# Patient Record
Sex: Female | Born: 1990 | Race: White | Hispanic: No | Marital: Single
Health system: Midwestern US, Community
[De-identification: ages and names within clinical notes are randomized; demographics above are authoritative.]

## PROBLEM LIST (undated history)

## (undated) ENCOUNTER — Inpatient Hospital Stay: Payer: Self-pay

## (undated) ENCOUNTER — Inpatient Hospital Stay (HOSPITAL_COMMUNITY): Payer: Self-pay

## (undated) ENCOUNTER — Emergency Department (HOSPITAL_COMMUNITY): Admission: EM | Payer: Medicaid Other | Source: Home / Self Care

## (undated) DIAGNOSIS — F329 Major depressive disorder, single episode, unspecified: Secondary | ICD-10-CM

## (undated) DIAGNOSIS — K759 Inflammatory liver disease, unspecified: Secondary | ICD-10-CM

## (undated) DIAGNOSIS — R51 Headache: Secondary | ICD-10-CM

## (undated) DIAGNOSIS — R519 Headache, unspecified: Secondary | ICD-10-CM

## (undated) DIAGNOSIS — I10 Essential (primary) hypertension: Secondary | ICD-10-CM

## (undated) DIAGNOSIS — F319 Bipolar disorder, unspecified: Secondary | ICD-10-CM

## (undated) DIAGNOSIS — F4310 Post-traumatic stress disorder, unspecified: Secondary | ICD-10-CM

## (undated) DIAGNOSIS — F988 Other specified behavioral and emotional disorders with onset usually occurring in childhood and adolescence: Secondary | ICD-10-CM

## (undated) DIAGNOSIS — Z9851 Tubal ligation status: Secondary | ICD-10-CM

## (undated) DIAGNOSIS — F32A Depression, unspecified: Secondary | ICD-10-CM

## (undated) DIAGNOSIS — F419 Anxiety disorder, unspecified: Secondary | ICD-10-CM

## (undated) HISTORY — DX: Post-traumatic stress disorder, unspecified: F43.10

## (undated) HISTORY — DX: Headache: R51

## (undated) HISTORY — DX: Headache, unspecified: R51.9

## (undated) HISTORY — DX: Depression, unspecified: F32.A

## (undated) HISTORY — PX: MOUTH SURGERY: SHX715

## (undated) HISTORY — DX: Bipolar disorder, unspecified: F31.9

## (undated) HISTORY — DX: Major depressive disorder, single episode, unspecified: F32.9

---
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
## 2009-01-05 ENCOUNTER — Emergency Department (HOSPITAL_COMMUNITY): Admission: EM | Admit: 2009-01-05 | Discharge: 2009-01-05 | Payer: Self-pay | Admitting: Family Medicine

## 2009-04-08 ENCOUNTER — Emergency Department (HOSPITAL_COMMUNITY): Admission: EM | Admit: 2009-04-08 | Discharge: 2009-04-08 | Payer: Self-pay | Admitting: Family Medicine

## 2009-06-24 ENCOUNTER — Emergency Department (HOSPITAL_COMMUNITY): Admission: EM | Admit: 2009-06-24 | Discharge: 2009-06-24 | Payer: Self-pay | Admitting: Family Medicine

## 2009-12-12 ENCOUNTER — Emergency Department (HOSPITAL_COMMUNITY): Admission: EM | Admit: 2009-12-12 | Discharge: 2009-12-12 | Payer: Self-pay | Admitting: Emergency Medicine

## 2009-12-31 ENCOUNTER — Emergency Department (HOSPITAL_COMMUNITY): Admission: EM | Admit: 2009-12-31 | Discharge: 2009-12-31 | Payer: Self-pay | Admitting: Emergency Medicine

## 2010-03-08 ENCOUNTER — Emergency Department (HOSPITAL_COMMUNITY)
Admission: EM | Admit: 2010-03-08 | Discharge: 2010-03-08 | Payer: Self-pay | Source: Home / Self Care | Admitting: Emergency Medicine

## 2010-03-09 LAB — POCT PREGNANCY, URINE: Preg Test, Ur: NEGATIVE

## 2010-04-27 LAB — COMPREHENSIVE METABOLIC PANEL
ALT: 23 U/L (ref 0–35)
AST: 25 U/L (ref 0–37)
Alkaline Phosphatase: 65 U/L (ref 39–117)
BUN: 14 mg/dL (ref 6–23)
CO2: 26 mEq/L (ref 19–32)
Calcium: 9.4 mg/dL (ref 8.4–10.5)
Chloride: 107 mEq/L (ref 96–112)
Creatinine, Ser: 0.64 mg/dL (ref 0.4–1.2)
GFR calc Af Amer: >60 mL/min (ref >60)
Glucose, Bld: 90 mg/dL (ref 70–99)
Potassium: 4.1 mEq/L (ref 3.5–5.1)
Total Bilirubin: 0.9 mg/dL (ref 0.3–1.2)
Total Protein: 7.2 g/dL (ref 6.0–8.3)

## 2010-04-27 LAB — DIFFERENTIAL
Eosinophils Absolute: 0.1 10*3/uL (ref 0.0–0.7)
Eosinophils Relative: 1 % (ref 0–5)
Lymphs Abs: 2.1 10*3/uL (ref 0.7–4.0)
Monocytes Absolute: 0.7 10*3/uL (ref 0.1–1.0)
Monocytes Relative: 6 % (ref 3–12)
Neutro Abs: 8.4 10*3/uL — ABNORMAL HIGH (ref 1.7–7.7)

## 2010-04-27 LAB — URINALYSIS, ROUTINE W REFLEX MICROSCOPIC
Bilirubin Urine: NEGATIVE
Ketones, ur: NEGATIVE mg/dL
Specific Gravity, Urine: 1.033 — ABNORMAL HIGH (ref 1.005–1.030)

## 2010-04-27 LAB — LIPASE, BLOOD: Lipase: 31 U/L (ref 11–59)

## 2010-04-27 LAB — CBC
Hemoglobin: 16.5 g/dL — ABNORMAL HIGH (ref 12.0–15.0)
MCV: 93.9 fL (ref 78.0–100.0)

## 2010-05-03 LAB — POCT I-STAT, CHEM 8
Calcium, Ion: 1.18 mmol/L (ref 1.12–1.32)
Hemoglobin: 16.3 g/dL — ABNORMAL HIGH (ref 12.0–15.0)
Potassium: 4.4 mEq/L (ref 3.5–5.1)
TCO2: 29 mmol/L (ref 0–100)

## 2010-05-18 LAB — POCT URINALYSIS DIP (DEVICE)
Ketones, ur: NEGATIVE mg/dL
Protein, ur: 100 mg/dL — AB
pH: 6 (ref 5.0–8.0)

## 2010-05-18 LAB — POCT PREGNANCY, URINE: Preg Test, Ur: NEGATIVE

## 2010-05-18 LAB — URINE CULTURE: Colony Count: >=100000

## 2010-05-26 LAB — RPR: RPR Ser Ql: NONREACTIVE

## 2010-05-26 LAB — CBC
HCT: 37.1 % (ref 36.0–46.0)
HCT: 38.9 % (ref 36.0–46.0)
MCHC: 34.6 g/dL (ref 30.0–36.0)
MCV: 96 fL (ref 78.0–100.0)
Platelets: 199 10*3/uL (ref 150–400)
RBC: 3.86 MIL/uL — ABNORMAL LOW (ref 3.87–5.11)
RBC: 4.14 MIL/uL (ref 3.87–5.11)
RDW: 13.3 % (ref 11.5–15.5)
WBC: 16 10*3/uL — ABNORMAL HIGH (ref 4.0–10.5)

## 2010-05-26 LAB — CCBB MATERNAL DONOR DRAW

## 2010-06-21 ENCOUNTER — Emergency Department (HOSPITAL_COMMUNITY)
Admission: EM | Admit: 2010-06-21 | Discharge: 2010-06-22 | Disposition: A | Discharge status: Home / Self Care | Payer: Self-pay | Attending: Emergency Medicine | Admitting: Emergency Medicine

## 2010-06-21 DIAGNOSIS — R1013 Epigastric pain: Secondary | ICD-10-CM | POA: Insufficient documentation

## 2010-06-21 DIAGNOSIS — R197 Diarrhea, unspecified: Secondary | ICD-10-CM | POA: Insufficient documentation

## 2010-06-21 DIAGNOSIS — R110 Nausea: Secondary | ICD-10-CM | POA: Insufficient documentation

## 2010-06-21 DIAGNOSIS — E860 Dehydration: Secondary | ICD-10-CM | POA: Insufficient documentation

## 2010-06-21 LAB — DIFFERENTIAL
Basophils Absolute: 0.1 10*3/uL (ref 0.0–0.1)
Eosinophils Absolute: 0.1 10*3/uL (ref 0.0–0.7)
Eosinophils Relative: 1 % (ref 0–5)
Lymphocytes Relative: 25 % (ref 12–46)
Neutrophils Relative %: 64 % (ref 43–77)

## 2010-06-21 LAB — COMPREHENSIVE METABOLIC PANEL
ALT: 18 U/L (ref 0–35)
AST: 21 U/L (ref 0–37)
Albumin: 4.2 g/dL (ref 3.5–5.2)
Alkaline Phosphatase: 89 U/L (ref 39–117)
Chloride: 102 mEq/L (ref 96–112)
GFR calc Af Amer: >60 mL/min (ref >60)
Potassium: 3.6 mEq/L (ref 3.5–5.1)
Sodium: 135 mEq/L (ref 135–145)
Total Protein: 7.7 g/dL (ref 6.0–8.3)

## 2010-06-21 LAB — URINALYSIS, ROUTINE W REFLEX MICROSCOPIC
Hgb urine dipstick: NEGATIVE
Specific Gravity, Urine: 1.034 — ABNORMAL HIGH (ref 1.005–1.030)
pH: 6 (ref 5.0–8.0)

## 2010-06-21 LAB — CBC
Platelets: 256 10*3/uL (ref 150–400)
RBC: 5.23 MIL/uL — ABNORMAL HIGH (ref 3.87–5.11)
RDW: 12.9 % (ref 11.5–15.5)
WBC: 9.8 10*3/uL (ref 4.0–10.5)

## 2010-06-21 LAB — POCT PREGNANCY, URINE: Preg Test, Ur: NEGATIVE

## 2010-06-21 LAB — URINE MICROSCOPIC-ADD ON

## 2010-06-28 NOTE — H&P (Signed)
[name], [name]              ACCOUNT NO.:  [id number]   MEDICAL RECORD NO.:  [id number]          PATIENT TYPE:  INP   LOCATION:  9173                          FACILITY:  [facility]   PHYSICIAN:  Janine Limbo, M.D.DATE OF BIRTH:  May 27, 1990   DATE OF ADMISSION:  05/11/2008  DATE OF DISCHARGE:                              HISTORY & PHYSICAL   CHIEF COMPLAINT AND HISTORY OF THE PRESENT ILLNESS:  The patient is an  20 year old gravida 2, para 1-0-0-1 at 40-1/7ths-week gestation who was  followed by the physicians at [facility], and who presents  today for induction of labor secondary to enlarged fetal bladder and  bilateral pyelectasis.  The patient's pregnancy has been remarkable for:  1. Teen second baby.  2. Smoker.  3. Hypotension with episodes of blackouts.  4. Ketonuria.  5. Enlarged fetal bladder with bilateral pyelectasis.   PRENATAL LABORATORY DATA:  The patient's prenatal labs upon entering  prenatal care included an initial hemoglobin of 14.4, hematocrit 41.3  and platelets 253,000.  Blood type A+, RPR nonreactive.  Rubella titer  immune.  Hepatitis B negative.  HIV negative; first semester screen with  a normal  range.  Pap normal.  Gonorrhea and chlamydia negative.  Beta  Strep negative.  Hepatitis C negative.  She did have a negative fetal  fibronectin in the second trimester.   CURRENT MEDICATIONS:  Prenatal vitamins daily.   HISTORY OF THE PRESENT PREGNANCY:  The patient began her prenatal care  on October 24, 2007 at the [redacted] weeks gestation.  She was given some  Phenergan for nausea and counseled on her nutrition.  At that time she  only weighed 114 pounds at 5 feet 4.5 inches and smoking cessation was  discussed as well.  At 12 weeks she reported some depression and was  encouraged to go and get some counseling, and was referred to a  maternity care coordinator with [facility] for all kinds of support  resources.  Her first trimester  screening was negative in September at  17 weeks.  She reported episodes of dizziness and blacking out.  Her AFP  was drawn at that time and hepatitis C was run due to a homemade tattoo  on her left hand.  The AFP was normal.  The hepatitis C was negative.  She was advised to improve her nutrition including her frequency of  meals and to change positions slowly.  Her anatomy ultrasound at 23  weeks showed size consistent with dates and a cervix of 4.5 cm, fluid  4.0 cm, an estimated fetal weight of 11 ounces, placenta was anterior  with a three-vessel cord, and all anatomy was normal.  Glucola was done  at 27 weeks with a result of 69.  At that time her RPR was nonreactive  and her hemoglobin was 13.2.  Her blood type is A+; and, antibody screen  was run and found to be negative.  At 29 weeks she had an upper  respiratory infection and was ordered some Mucinex, and counseled again  on smoking cessation.  At 31 weeks she was complaining  of pelvic  pressure and a fetal fibronectin was done, which was negative.  At 36  weeks she had tests for gonorrhea, Chlamydia and GBS, which turned out  to be negative.  At 40 weeks she was noted to have increased bladder  size of the fetus on an ultrasound and bilateral pyelectasis, and was  encouraged to consider induction, which she accepted and she presents  today for that.   PAST OBSTETRICAL HISTORY:  The patient has had one other pregnancy,  which resulted in the birth of a daughter in July 2008 at [redacted] weeks  gestation delivered vaginally without complications.   ALLERGIES:  The patient has no known medication allergies, latex  allergies or food allergies.   PAST MEDICAL HISTORY:  The patient has a history of postpartum  depression and yeast infections.   FAMILY HISTORY:  The patient's family history includes having a mother  who entered a persistent vegetative state following two heart attacks;  she is now 21 years old.  Her paternal grandmother  is an insulin-  dependent diabetic.  She does not have contact with her father and does  not have a lot of information on him or his side of the family.   GENETIC HISTORY:  The genetic history is noncontributory.  There is no  cystic fibrosis screen listed.  The father of the baby does report  having a great aunt with mental retardation.  Otherwise his genetic  history is noncontributory.   SOCIAL HISTORY:  The patient is in a relationship with Michel Bickers.  They are both Caucasian and teenagers.  Her occupation is listed as  window cleaning.  I do not see level of education on her or the father  of the baby.  He is disabled due to a Staph infection in his left leg  and surgical intervention for that.  He also has bipolar disorder.  There is no religion stated.  The patient is a smoker.  She denies the  use of alcohol or street drugs during the pregnancy.   PHYSICAL EXAMINATION:  VITAL SIGNS:  The vital signs include a  temperature of 98.6, pulse 90, respiratory rate 16 and blood pressure  118/66.  HEENT:  The head, eyes, ears, nose, and throat are within normal limits.  LUNGS:  The lungs are clear to auscultation bilaterally.  HEART:  The heart is with a regular rate and rhythm.  No murmurs.  BREASTS:  The breasts are soft.  ABDOMEN:  The abdomen is soft and nontender.  Gravid, appropriate for  gestational age.  EXTREMITIES:  The extremities are within normal limits with trace edema  in the bilateral lower extremities.  Normal DTRs.  Negative Homans' sign  x2.  VAGINAL EXAMINATION:  Fetal heart rate 135 with accelerations, reactive  and reassuring.  Toco tracing; very rare mild uterine contractions of 60  seconds.  Bishop score of 6 per the R.N.'s exam.   IMPRESSION:  1. [age]-year-old gravida 2, para 1-0-0-1 at 40-1/7ths weeks.  2. Enlarged fetal bladder.  3. Bilateral pyelectasis.  4. Reassuring fetal heart rate okay for induction of labor.   PLAN:  1. Admit to  birthing suite.  2. Routine admission orders.  3. Cytotec and Pitocin per protocol, and M.D. management.  4. Social Services consult postpartum for teen second pregnancy and      history of depression.      Eulogio Bear, CNM      Janine Limbo, M.D.  Electronically Signed  JM/MEDQ  D:  05/12/2008  T:  05/12/2008  Job:  [id number]
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
## 2010-08-21 ENCOUNTER — Emergency Department (HOSPITAL_COMMUNITY)
Admission: EM | Admit: 2010-08-21 | Discharge: 2010-08-21 | Disposition: A | Discharge status: Home / Self Care | Payer: Self-pay | Attending: Emergency Medicine | Admitting: Emergency Medicine

## 2010-08-21 DIAGNOSIS — J4489 Other specified chronic obstructive pulmonary disease: Secondary | ICD-10-CM | POA: Insufficient documentation

## 2010-08-21 DIAGNOSIS — J449 Chronic obstructive pulmonary disease, unspecified: Secondary | ICD-10-CM | POA: Insufficient documentation

## 2010-08-21 DIAGNOSIS — K089 Disorder of teeth and supporting structures, unspecified: Secondary | ICD-10-CM | POA: Insufficient documentation

## 2010-08-21 DIAGNOSIS — E119 Type 2 diabetes mellitus without complications: Secondary | ICD-10-CM | POA: Insufficient documentation

## 2010-08-21 DIAGNOSIS — IMO0002 Reserved for concepts with insufficient information to code with codable children: Secondary | ICD-10-CM | POA: Insufficient documentation

## 2010-08-21 DIAGNOSIS — S025XXA Fracture of tooth (traumatic), initial encounter for closed fracture: Secondary | ICD-10-CM | POA: Insufficient documentation

## 2010-08-21 DIAGNOSIS — I2510 Atherosclerotic heart disease of native coronary artery without angina pectoris: Secondary | ICD-10-CM | POA: Insufficient documentation

## 2010-08-21 DIAGNOSIS — S01501A Unspecified open wound of lip, initial encounter: Secondary | ICD-10-CM | POA: Insufficient documentation

## 2010-11-29 LAB — CBC
HCT: 41.5
Hemoglobin: 11.3 — ABNORMAL LOW
MCHC: 33.8
MCHC: 33.9
MCV: 91.9
Platelets: 234
Platelets: 240
RDW: 13.1
RDW: 13.5
WBC: 17.8 — ABNORMAL HIGH

## 2011-01-08 ENCOUNTER — Emergency Department (HOSPITAL_COMMUNITY)
Admission: EM | Admit: 2011-01-08 | Discharge: 2011-01-08 | Disposition: A | Discharge status: Home / Self Care | Payer: Self-pay | Attending: Emergency Medicine | Admitting: Emergency Medicine

## 2011-01-08 ENCOUNTER — Emergency Department (HOSPITAL_COMMUNITY): Payer: Self-pay

## 2011-01-08 ENCOUNTER — Encounter: Payer: Self-pay | Admitting: Emergency Medicine

## 2011-01-08 DIAGNOSIS — W1789XA Other fall from one level to another, initial encounter: Secondary | ICD-10-CM | POA: Insufficient documentation

## 2011-01-08 DIAGNOSIS — M25539 Pain in unspecified wrist: Secondary | ICD-10-CM | POA: Insufficient documentation

## 2011-01-08 DIAGNOSIS — M79609 Pain in unspecified limb: Secondary | ICD-10-CM | POA: Insufficient documentation

## 2011-01-08 DIAGNOSIS — S6390XA Sprain of unspecified part of unspecified wrist and hand, initial encounter: Secondary | ICD-10-CM | POA: Insufficient documentation

## 2011-01-08 DIAGNOSIS — M7989 Other specified soft tissue disorders: Secondary | ICD-10-CM | POA: Insufficient documentation

## 2011-01-08 DIAGNOSIS — F17200 Nicotine dependence, unspecified, uncomplicated: Secondary | ICD-10-CM | POA: Insufficient documentation

## 2011-01-08 DIAGNOSIS — S63602A Unspecified sprain of left thumb, initial encounter: Secondary | ICD-10-CM
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
## 2011-01-08 NOTE — Progress Notes (Signed)
Orthopedic Tech Progress Note Patient Details:  [name] 03-03-1990 [id number]  Type of Splint: Thumb velcro Splint Location: left hand Splint Interventions: Application    Nikki Dom 01/08/2011, 10:08 PM
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
## 2011-01-09 NOTE — ED Provider Notes (Signed)
Medical screening examination/treatment/procedure(s) were performed by non-physician practitioner and as supervising physician I was immediately available for consultation/collaboration.   Vonceil Upshur R. Worth Kober, MD 01/09/11 0015 

## 2011-04-16 ENCOUNTER — Emergency Department (HOSPITAL_COMMUNITY): Payer: No Typology Code available for payment source

## 2011-04-16 ENCOUNTER — Emergency Department (HOSPITAL_COMMUNITY)
Admission: EM | Admit: 2011-04-16 | Discharge: 2011-04-16 | Disposition: A | Discharge status: Home / Self Care | Payer: No Typology Code available for payment source | Attending: Emergency Medicine | Admitting: Emergency Medicine

## 2011-04-16 ENCOUNTER — Encounter (HOSPITAL_COMMUNITY): Payer: Self-pay | Admitting: *Deleted

## 2011-04-16 DIAGNOSIS — M542 Cervicalgia: Secondary | ICD-10-CM | POA: Insufficient documentation

## 2011-04-16 DIAGNOSIS — T22119A Burn of first degree of unspecified forearm, initial encounter: Secondary | ICD-10-CM | POA: Insufficient documentation

## 2011-04-16 MED ORDER — DIAZEPAM 5 MG PO TABS: 5.0000 mg | ORAL_TABLET | Freq: Two times a day (BID) | ORAL | Status: AC

## 2011-04-16 MED ORDER — SILVER SULFADIAZINE 1 % EX CREA
TOPICAL_CREAM | Freq: Once | CUTANEOUS | Status: AC
  Administered 2011-04-16: 19:00:00 via TOPICAL
  Filled 2011-04-16: qty 85

## 2011-04-16 MED ORDER — IBUPROFEN 600 MG PO TABS: 600.0000 mg | ORAL_TABLET | Freq: Four times a day (QID) | ORAL | Status: DC | PRN

## 2011-04-16 MED ORDER — ACETAMINOPHEN 325 MG PO TABS
ORAL_TABLET | ORAL | Status: AC
  Filled 2011-04-16: qty 2
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
## 2011-04-17 ENCOUNTER — Encounter (HOSPITAL_COMMUNITY): Payer: Self-pay

## 2011-04-17 ENCOUNTER — Emergency Department (HOSPITAL_COMMUNITY)
Admission: EM | Admit: 2011-04-17 | Discharge: 2011-04-17 | Disposition: A | Discharge status: Home Health | Payer: No Typology Code available for payment source | Attending: Emergency Medicine | Admitting: Emergency Medicine

## 2011-04-17 DIAGNOSIS — S335XXA Sprain of ligaments of lumbar spine, initial encounter: Secondary | ICD-10-CM | POA: Insufficient documentation

## 2011-04-17 DIAGNOSIS — S161XXA Strain of muscle, fascia and tendon at neck level, initial encounter: Secondary | ICD-10-CM

## 2011-04-17 DIAGNOSIS — S139XXA Sprain of joints and ligaments of unspecified parts of neck, initial encounter: Secondary | ICD-10-CM | POA: Insufficient documentation

## 2011-04-17 DIAGNOSIS — M545 Low back pain, unspecified: Secondary | ICD-10-CM | POA: Insufficient documentation

## 2011-04-17 DIAGNOSIS — S39012A Strain of muscle, fascia and tendon of lower back, initial encounter: Secondary | ICD-10-CM

## 2011-04-17 DIAGNOSIS — M542 Cervicalgia: Secondary | ICD-10-CM | POA: Insufficient documentation

## 2011-04-17 MED ORDER — METHOCARBAMOL 500 MG PO TABS
500.0000 mg | ORAL_TABLET | Freq: Once | ORAL | Status: AC
  Administered 2011-04-17: 500 mg via ORAL
  Filled 2011-04-17: qty 1

## 2011-04-17 MED ORDER — KETOROLAC TROMETHAMINE 60 MG/2ML IM SOLN
60.0000 mg | Freq: Once | INTRAMUSCULAR | Status: AC
  Administered 2011-04-17: 60 mg via INTRAMUSCULAR
  Filled 2011-04-17: qty 2

## 2011-04-17 MED ORDER — DEXAMETHASONE SODIUM PHOSPHATE 10 MG/ML IJ SOLN
10.0000 mg | Freq: Once | INTRAMUSCULAR | Status: AC
  Administered 2011-04-17: 10 mg via INTRAMUSCULAR
  Filled 2011-04-17: qty 1

## 2011-04-17 MED ORDER — TRAMADOL HCL 50 MG PO TABS: 50.0000 mg | ORAL_TABLET | Freq: Four times a day (QID) | ORAL | Status: AC | PRN

## 2011-04-17 MED ORDER — METHOCARBAMOL 500 MG PO TABS: 500.0000 mg | ORAL_TABLET | Freq: Two times a day (BID) | ORAL | Status: AC

## 2011-04-17 NOTE — Discharge Instructions (Signed)
Back Pain, Adult Low back pain is very common. About 1 in 5 people have back pain.The cause of low back pain is rarely dangerous. The pain often gets better over time.About half of people with a sudden onset of back pain feel better in just 2 weeks. About 8 in 10 people feel better by 6 weeks.  CAUSES Some common causes of back pain include:  Strain of the muscles or ligaments supporting the spine.   Wear and tear (degeneration) of the spinal discs.   Arthritis.   Direct injury to the back.  DIAGNOSIS Most of the time, the direct cause of low back pain is not known.However, back pain can be treated effectively even when the exact cause of the pain is unknown.Answering your caregiver's questions about your overall health and symptoms is one of the most accurate ways to make sure the cause of your pain is not dangerous. If your caregiver needs more information, he or she may order lab work or imaging tests (X-rays or MRIs).However, even if imaging tests show changes in your back, this usually does not require surgery. HOME CARE INSTRUCTIONS For many people, back pain returns.Since low back pain is rarely dangerous, it is often a condition that people can learn to manageon their own.   Remain active. It is stressful on the back to sit or stand in one place. Do not sit, drive, or stand in one place for more than 30 minutes at a time. Take short walks on level surfaces as soon as pain allows.Try to increase the length of time you walk each day.   Do not stay in bed.Resting more than 1 or 2 days can delay your recovery.   Do not avoid exercise or work.Your body is made to move.It is not dangerous to be active, even though your back may hurt.Your back will likely heal faster if you return to being active before your pain is gone.   Pay attention to your body when you bend and lift. Many people have less discomfortwhen lifting if they bend their knees, keep the load close to their  bodies,and avoid twisting. Often, the most comfortable positions are those that put less stress on your recovering back.   Find a comfortable position to sleep. Use a firm mattress and lie on your side with your knees slightly bent. If you lie on your back, put a pillow under your knees.   Only take over-the-counter or prescription medicines as directed by your caregiver. Over-the-counter medicines to reduce pain and inflammation are often the most helpful.Your caregiver may prescribe muscle relaxant drugs.These medicines help dull your pain so you can more quickly return to your normal activities and healthy exercise.   Put ice on the injured area.   Put ice in a plastic bag.   Place a towel between your skin and the bag.   Leave the ice on for 15 to 20 minutes, 3 to 4 times a day for the first 2 to 3 days. After that, ice and heat may be alternated to reduce pain and spasms.   Ask your caregiver about trying back exercises and gentle massage. This may be of some benefit.   Avoid feeling anxious or stressed.Stress increases muscle tension and can worsen back pain.It is important to recognize when you are anxious or stressed and learn ways to manage it.Exercise is a great option.  SEEK MEDICAL CARE IF:  You have pain that is not relieved with rest or medicine.   You have   pain that does not improve in 1 week.   You have new symptoms.   You are generally not feeling well.  SEEK IMMEDIATE MEDICAL CARE IF:   You have pain that radiates from your back into your legs.   You develop new bowel or bladder control problems.   You have unusual weakness or numbness in your arms or legs.   You develop nausea or vomiting.   You develop abdominal pain.   You feel faint.  Document Released: 01/30/2005 Document Revised: 01/19/2011 Document Reviewed: 06/20/2010 [facility] Patient Information 2012 [place], [place].  Cervical Sprain A cervical sprain is an injury in the neck in which the  ligaments are stretched or torn. The ligaments are the tissues that hold the bones of the neck (vertebrae) in place.Cervical sprains can range from very mild to very severe. Most cervical sprains get better in 1 to 3 weeks, but it depends on the cause and extent of the injury. Severe cervical sprains can cause the neck vertebrae to be unstable. This can lead to damage of the spinal cord and can result in serious nervous system problems. Your caregiver will determine whether your cervical sprain is mild or severe. CAUSES  Severe cervical sprains may be caused by:  Contact sport injuries (football, rugby, wrestling, hockey, auto racing, gymnastics, diving, martial arts, boxing).   Motor vehicle collisions.   Whiplash injuries. This means the neck is forcefully whipped backward and forward.   Falls.  Mild cervical sprains may be caused by:   Awkward positions, such as cradling a telephone between your ear and shoulder.   Sitting in a chair that does not offer proper support.   Working at a poorly Marketing executive station.   Activities that require looking up or down for long periods of time.  SYMPTOMS   Pain, soreness, stiffness, or a burning sensation in the front, back, or sides of the neck. This discomfort may develop immediately after injury or it may develop slowly and not begin for 24 hours or more after an injury.   Pain or tenderness directly in the middle of the back of the neck.   Shoulder or upper back pain.   Limited ability to move the neck.   Headache.   Dizziness.   Weakness, numbness, or tingling in the hands or arms.   Muscle spasms.   Difficulty swallowing or chewing.   Tenderness and swelling of the neck.  DIAGNOSIS  Most of the time, your caregiver can diagnose this problem by taking your history and doing a physical exam. Your caregiver will ask about any known problems, such as arthritis in the neck or a previous neck injury. X-rays may be taken to find  out if there are any other problems, such as problems with the bones of the neck. However, an X-ray often does not reveal the full extent of a cervical sprain. Other tests such as a computed tomography (CT) scan or magnetic resonance imaging (MRI) may be needed. TREATMENT  Treatment depends on the severity of the cervical sprain. Mild sprains can be treated with rest, keeping the neck in place (immobilization), and pain medicines. Severe cervical sprains need immediate immobilization and an appointment with an orthopedist or neurosurgeon. Several treatment options are available to help with pain, muscle spasms, and other symptoms. Your caregiver may prescribe:  Medicines, such as pain relievers, numbing medicines, or muscle relaxants.   Physical therapy. This can include stretching exercises, strengthening exercises, and posture training. Exercises and improved posture can help  stabilize the neck, strengthen muscles, and help stop symptoms from returning.   A neck collar to be worn for short periods of time. Often, these collars are worn for comfort. However, certain collars may be worn to protect the neck and prevent further worsening of a serious cervical sprain.  HOME CARE INSTRUCTIONS   Put ice on the injured area.   Put ice in a plastic bag.   Place a towel between your skin and the bag.   Leave the ice on for 15 to 20 minutes, 3 to 4 times a day.   Only take over-the-counter or prescription medicines for pain, discomfort, or fever as directed by your caregiver.   Keep all follow-up appointments as directed by your caregiver.   Keep all physical therapy appointments as directed by your caregiver.   If a neck collar is prescribed, wear it as directed by your caregiver.   Do not drive while wearing a neck collar.   Make any needed adjustments to your work station to promote good posture.   Avoid positions and activities that make your symptoms worse.   Warm up and stretch before  being active to help prevent problems.  SEEK MEDICAL CARE IF:   Your pain is not controlled with medicine.   You are unable to decrease your pain medicine over time as planned.   Your activity level is not improving as expected.  SEEK IMMEDIATE MEDICAL CARE IF:   You develop any bleeding, stomach upset, or signs of an allergic reaction to your medicine.   Your symptoms get worse.   You develop new, unexplained symptoms.   You have numbness, tingling, weakness, or paralysis in any part of your body.  MAKE SURE YOU:   Understand these instructions.   Will watch your condition.   Will get help right away if you are not doing well or get worse.  Document Released: 11/27/2006 Document Revised: 01/19/2011 Document Reviewed: 11/02/2010 [facility] Patient Information 2012 [place], [place].  Lumbosacral Strain Lumbosacral strain is one of the most common causes of back pain. There are many causes of back pain. Most are not serious conditions. CAUSES  Your backbone (spinal column) is made up of 24 main vertebral bodies, the sacrum, and the coccyx. These are held together by muscles and tough, fibrous tissue (ligaments). Nerve roots pass through the openings between the vertebrae. A sudden move or injury to the back may cause injury to, or pressure on, these nerves. This may result in localized back pain or pain movement (radiation) into the buttocks, down the leg, and into the foot. Sharp, shooting pain from the buttock down the back of the leg (sciatica) is frequently associated with a ruptured (herniated) disk. Pain may be caused by muscle spasm alone. Your caregiver can often find the cause of your pain by the details of your symptoms and an exam. In some cases, you may need tests (such as X-rays). Your caregiver will work with you to decide if any tests are needed based on your specific exam. HOME CARE INSTRUCTIONS   Avoid an underactive lifestyle. Active exercise, as directed by your  caregiver, is your greatest weapon against back pain.   Avoid hard physical activities (tennis, racquetball, waterskiing) if you are not in proper physical condition for it. This may aggravate or create problems.   If you have a back problem, avoid sports requiring sudden body movements. Swimming and walking are generally safer activities.   Maintain good posture.   Avoid becoming overweight (obese).  Use bed rest for only the most extreme, sudden (acute) episode. Your caregiver will help you determine how much bed rest is necessary.   For acute conditions, you may put ice on the injured area.   Put ice in a plastic bag.   Place a towel between your skin and the bag.   Leave the ice on for 15 to 20 minutes at a time, every 2 hours, or as needed.   After you are improved and more active, it may help to apply heat for 30 minutes before activities.  See your caregiver if you are having pain that lasts longer than expected. Your caregiver can advise appropriate exercises or therapy if needed. With conditioning, most back problems can be avoided. SEEK IMMEDIATE MEDICAL CARE IF:   You have numbness, tingling, weakness, or problems with the use of your arms or legs.   You experience severe back pain not relieved with medicines.   There is a change in bowel or bladder control.   You have increasing pain in any area of the body, including your belly (abdomen).   You notice shortness of breath, dizziness, or feel faint.   You feel sick to your stomach (nauseous), are throwing up (vomiting), or become sweaty.   You notice discoloration of your toes or legs, or your feet get very cold.   Your back pain is getting worse.   You have a fever.  MAKE SURE YOU:   Understand these instructions.   Will watch your condition.   Will get help right away if you are not doing well or get worse.  Document Released: 11/09/2004 Document Revised: 01/19/2011 Document Reviewed:  05/01/2008 [facility] Patient Information 2012 [place], [place].
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
## 2011-04-18 NOTE — ED Provider Notes (Signed)
Medical screening examination/treatment/procedure(s) were performed by non-physician practitioner and as supervising physician I was immediately available for consultation/collaboration.    Legacie Dillingham R Maurisio Ruddy, MD 04/18/11 0735 

## 2011-04-18 NOTE — ED Provider Notes (Signed)
Medical screening examination/treatment/procedure(s) were performed by non-physician practitioner and as supervising physician I was immediately available for consultation/collaboration.   Suzi Roots, MD 04/18/11 [phone]

## 2011-11-01 ENCOUNTER — Encounter (HOSPITAL_COMMUNITY): Payer: Self-pay

## 2011-11-01 ENCOUNTER — Emergency Department (INDEPENDENT_AMBULATORY_CARE_PROVIDER_SITE_OTHER)
Admission: EM | Admit: 2011-11-01 | Discharge: 2011-11-01 | Disposition: A | Discharge status: Home / Self Care | Payer: Self-pay | Source: Home / Self Care | Attending: Family Medicine | Admitting: Family Medicine

## 2011-11-01 DIAGNOSIS — N390 Urinary tract infection, site not specified: Secondary | ICD-10-CM

## 2011-11-01 LAB — POCT URINALYSIS DIP (DEVICE)
Ketones, ur: NEGATIVE mg/dL
Protein, ur: 100 mg/dL — AB
Specific Gravity, Urine: >=1.03 (ref 1.005–1.030)
Urobilinogen, UA: 1 mg/dL (ref 0.0–1.0)

## 2011-11-01 MED ORDER — CEPHALEXIN 500 MG PO CAPS: 500.0000 mg | ORAL_CAPSULE | Freq: Three times a day (TID) | ORAL | Status: DC

## 2011-11-01 MED ORDER — IBUPROFEN 600 MG PO TABS: 600.0000 mg | ORAL_TABLET | Freq: Once | ORAL | Status: DC

## 2011-11-01 MED ORDER — IBUPROFEN 800 MG PO TABS
ORAL_TABLET | ORAL | Status: AC
  Filled 2011-11-01: qty 1

## 2011-11-01 MED ORDER — IBUPROFEN 800 MG PO TABS
800.0000 mg | ORAL_TABLET | Freq: Once | ORAL | Status: AC
  Administered 2011-11-01: 800 mg via ORAL

## 2011-11-01 MED ORDER — LIDOCAINE HCL (PF) 1 % IJ SOLN
INTRAMUSCULAR | Status: AC
  Filled 2011-11-01: qty 5

## 2011-11-01 MED ORDER — CEFTRIAXONE SODIUM 1 G IJ SOLR
INTRAMUSCULAR | Status: AC
  Filled 2011-11-01: qty 10

## 2011-11-01 MED ORDER — CEFTRIAXONE SODIUM 1 G IJ SOLR
1.0000 g | Freq: Once | INTRAMUSCULAR | Status: AC
  Administered 2011-11-01: 1 g via INTRAMUSCULAR

## 2011-11-01 MED ORDER — IBUPROFEN 600 MG PO TABS: 600.0000 mg | ORAL_TABLET | Freq: Three times a day (TID) | ORAL | Status: DC | PRN

## 2011-11-01 MED ORDER — PHENAZOPYRIDINE HCL 200 MG PO TABS: 200.0000 mg | ORAL_TABLET | Freq: Three times a day (TID) | ORAL | Status: DC

## 2011-11-01 NOTE — ED Provider Notes (Signed)
History     CSN: [id number]  Arrival date & time 11/01/11  1138   First MD Initiated Contact with Patient 11/01/11 1140      Chief Complaint  Patient presents with  . Dysuria    (Consider location/radiation/quality/duration/timing/severity/associated sxs/prior treatment) HPI Comments: 21 year old smoker female otherwise no significant past medical history. Here complaining of urinary frequency, burning on urination and suprapubic pain . Also noticed blood when wiping after urination. Denies back pain. No nausea or vomiting. No fever or chills. No headache. No history of recurrent UTIs.   History reviewed. No pertinent past medical history.  History reviewed. No pertinent past surgical history.  No family history on file.  History  Substance Use Topics  . Smoking status: Current Every Day Smoker  . Smokeless tobacco: Not on file  . Alcohol Use: Yes    OB History    Grav Para Term Preterm Abortions TAB SAB Ect Mult Living                  Review of Systems  Constitutional: Negative for fever and chills.  Gastrointestinal: Negative for nausea and vomiting.  Genitourinary: Positive for dysuria and frequency. Negative for flank pain, vaginal bleeding and vaginal discharge.  Neurological: Negative for dizziness and headaches.    Allergies  Vicodin  Home Medications   Current Outpatient Rx  Name Route Sig Dispense Refill  . CEPHALEXIN 500 MG PO CAPS Oral Take 1 capsule (500 mg total) by mouth 3 (three) times daily. 21 capsule 0  . IBUPROFEN 600 MG PO TABS Oral Take 1 tablet (600 mg total) by mouth every 8 (eight) hours as needed for pain or fever. 20 tablet 0  . NAPROXEN SODIUM 220 MG PO TABS Oral Take 440 mg by mouth once as needed. As needed for pain.    Marland Kitchen PHENAZOPYRIDINE HCL 200 MG PO TABS Oral Take 1 tablet (200 mg total) by mouth 3 (three) times daily. 6 tablet 0    BP 111/71  Pulse 80  Temp 98.3 F (36.8 C) (Oral)  Resp 16  SpO2 100%  LMP  10/18/2011  Physical Exam  Nursing note and vitals reviewed. Constitutional: She is oriented to person, place, and time. She appears well-developed and well-nourished. No distress.  HENT:  Head: Normocephalic and atraumatic.  Cardiovascular: Normal heart sounds.   Pulmonary/Chest: Breath sounds normal.  Abdominal: Soft. Bowel sounds are normal. She exhibits no distension and no mass. There is no rebound and no guarding.       Suprapubic tenderness. No CVT bilaterally.  Neurological: She is alert and oriented to person, place, and time.    ED Course  Procedures (including critical care time)  Labs Reviewed  POCT URINALYSIS DIP (DEVICE) - Abnormal; Notable for the following:    Bilirubin Urine SMALL (*)     Hgb urine dipstick LARGE (*)     Protein, ur 100 (*)     Leukocytes, UA SMALL (*)  Biochemical Testing Only. Please order routine urinalysis from main lab if confirmatory testing is needed.   All other components within normal limits  POCT PREGNANCY, URINE  URINE CULTURE   No results found.   1. Urinary tract infection       MDM  21 y/o female here with hemorrhagic cystitis. Treated with Rocephin 1 g x1 IM here and ibuprofen oral 800 mg x1 as patient is very uncomfortable. Prescribe cephalexin 3 times a day for 7 days. Urine culture pending. Asked to return if no improvement  or if worsening symptoms despite following treatment.        Sharin Grave, MD 11/02/11 [phone]

## 2011-11-01 NOTE — ED Notes (Signed)
C/o suprapubic pain, painful urination , urgency and has noticed blood on tissue with wiping.  States suprapubic pain awakened her early this am- this is when all sx started.

## 2011-11-04 LAB — URINE CULTURE

## 2011-11-06 NOTE — ED Notes (Signed)
Urine culture positive for ESCHERICHIA COLI, pt adequately treated at visit with Keflex.

## 2012-02-20 ENCOUNTER — Emergency Department (HOSPITAL_COMMUNITY)
Admission: EM | Admit: 2012-02-20 | Discharge: 2012-02-21 | Disposition: A | Discharge status: Home Health | Payer: Self-pay | Attending: Emergency Medicine | Admitting: Emergency Medicine

## 2012-02-20 ENCOUNTER — Encounter (HOSPITAL_COMMUNITY): Payer: Self-pay | Admitting: *Deleted

## 2012-02-20 DIAGNOSIS — Z3202 Encounter for pregnancy test, result negative: Secondary | ICD-10-CM | POA: Insufficient documentation

## 2012-02-20 DIAGNOSIS — Z30432 Encounter for removal of intrauterine contraceptive device: Secondary | ICD-10-CM | POA: Insufficient documentation

## 2012-02-20 DIAGNOSIS — N83209 Unspecified ovarian cyst, unspecified side: Secondary | ICD-10-CM | POA: Insufficient documentation

## 2012-02-20 DIAGNOSIS — F17200 Nicotine dependence, unspecified, uncomplicated: Secondary | ICD-10-CM | POA: Insufficient documentation

## 2012-02-20 DIAGNOSIS — N83201 Unspecified ovarian cyst, right side: Secondary | ICD-10-CM

## 2012-02-20 LAB — URINALYSIS, ROUTINE W REFLEX MICROSCOPIC
Leukocytes, UA: NEGATIVE
Nitrite: NEGATIVE
Protein, ur: NEGATIVE mg/dL
Urobilinogen, UA: 0.2 mg/dL (ref 0.0–1.0)

## 2012-02-20 LAB — POCT PREGNANCY, URINE: Preg Test, Ur: NEGATIVE

## 2012-02-20 MED ORDER — IBUPROFEN 800 MG PO TABS
800.0000 mg | ORAL_TABLET | Freq: Once | ORAL | Status: AC
  Administered 2012-02-20: 800 mg via ORAL
  Filled 2012-02-20: qty 1

## 2012-02-20 NOTE — ED Provider Notes (Signed)
History     CSN: [id number]  Arrival date & time 02/20/12  1815   First MD Initiated Contact with Patient 02/20/12 2230      Chief Complaint  Patient presents with  . Vaginal Pain    (Consider location/radiation/quality/duration/timing/severity/associated sxs/prior treatment) HPI  22 year old female presents complaining of vaginal pain. Patient reports she has an IUD. She is sexually active, most recent was 2 days ago. At that time she didn't have any specific pain or discomfort. This morning she was awoke with sharp stabbing pain to her vagina. She perform self-exam in the bathroom and felt a string in her vagina, which is not normal for her. She continues to endorse constant sharp stabbing pain, 8/10, moderate in severity, worse when she lies with her leg straight and improves when she is in a fetal position. She took some Tylenol which provide minimal relief. Otherwise patient denies fever, chills, abdominal pain, dysuria, vaginal discharge. She has no prior history of STD. Her last menstrual period was January 5.  History reviewed. No pertinent past medical history.  History reviewed. No pertinent past surgical history.  No family history on file.  History  Substance Use Topics  . Smoking status: Current Every Day Smoker  . Smokeless tobacco: Not on file  . Alcohol Use: Yes    OB History    Grav Para Term Preterm Abortions TAB SAB Ect Mult Living                  Review of Systems  Constitutional: Negative for fever.  Gastrointestinal: Negative for nausea and vomiting.  Genitourinary: Positive for vaginal pain. Negative for dysuria, hematuria and vaginal discharge.  Skin: Negative for rash and wound.  Neurological: Negative for numbness.    Allergies  Vicodin  Home Medications  No current outpatient prescriptions on file.  BP 151/88  Pulse 93  Temp 98.1 F (36.7 C) (Oral)  Resp 18  SpO2 99%  LMP 02/18/2012  Physical Exam  Nursing note and vitals  reviewed. Constitutional: She appears well-developed and well-nourished. No distress.  HENT:  Head: Normocephalic and atraumatic.  Eyes: Conjunctivae normal are normal.  Neck: Normal range of motion. Neck supple.  Cardiovascular: Normal rate and regular rhythm.   Pulmonary/Chest: Effort normal and breath sounds normal. She exhibits no tenderness.  Abdominal: Soft. There is no tenderness. Hernia confirmed negative in the right inguinal area and confirmed negative in the left inguinal area.  Genitourinary: Uterus normal. There is no rash or lesion on the right labia. There is no rash or lesion on the left labia. Cervix exhibits no motion tenderness and no discharge. Right adnexum displays tenderness. Right adnexum displays no mass. Left adnexum displays no mass and no tenderness. There is bleeding around the vagina. No erythema or tenderness around the vagina. No foreign body around the vagina. No vaginal discharge found.       Chaperone present   IUD string appears to be in place.  No IUD device seen.  Pt has R adnexal tenderness, no CMT.  No discharge  Lymphadenopathy:       Right: No inguinal adenopathy present.       Left: No inguinal adenopathy present.    ED Course  FOREIGN BODY REMOVAL Date/Time: 02/21/2012 1:43 AM Performed by: Fayrene Helper Authorized by: Fayrene Helper Consent: Verbal consent obtained. Written consent not obtained. Risks and benefits: risks, benefits and alternatives were discussed Consent given by: patient Patient understanding: patient states understanding of the procedure being performed Patient  consent: the patient's understanding of the procedure matches consent given Procedure consent: procedure consent matches procedure scheduled Imaging studies: imaging studies available Patient identity confirmed: verbally with patient and arm band Time out: Immediately prior to procedure a "time out" was called to verify the correct patient, procedure, equipment, support  staff and site/side marked as required. Intake: cervical os. Patient cooperative: yes Complexity: simple 1 objects recovered. Objects recovered: IUD Post-procedure assessment: foreign body removed Patient tolerance: Patient tolerated the procedure well with no immediate complications. Comments: Chaperone present   (including critical care time)  Labs Reviewed  URINALYSIS, ROUTINE W REFLEX MICROSCOPIC - Abnormal; Notable for the following:    Specific Gravity, Urine >1.030 (*)     Ketones, ur 15 (*)     All other components within normal limits  POCT PREGNANCY, URINE  WET PREP, GENITAL  GC/CHLAMYDIA PROBE AMP   No results found.   No diagnosis found.  Results for orders placed during the hospital encounter of 02/20/12  URINALYSIS, ROUTINE W REFLEX MICROSCOPIC      Component Value Range   Color, Urine YELLOW  YELLOW   APPearance CLEAR  CLEAR   Specific Gravity, Urine >1.030 (*) 1.005 - 1.030   pH 5.5  5.0 - 8.0   Glucose, UA NEGATIVE  NEGATIVE mg/dL   Hgb urine dipstick NEGATIVE  NEGATIVE   Bilirubin Urine NEGATIVE  NEGATIVE   Ketones, ur 15 (*) NEGATIVE mg/dL   Protein, ur NEGATIVE  NEGATIVE mg/dL   Urobilinogen, UA 0.2  0.0 - 1.0 mg/dL   Nitrite NEGATIVE  NEGATIVE   Leukocytes, UA NEGATIVE  NEGATIVE  POCT PREGNANCY, URINE      Component Value Range   Preg Test, Ur NEGATIVE  NEGATIVE  WET PREP, GENITAL      Component Value Range   Yeast Wet Prep HPF POC NONE SEEN  NONE SEEN   Trich, Wet Prep NONE SEEN  NONE SEEN   Clue Cells Wet Prep HPF POC NONE SEEN  NONE SEEN   WBC, Wet Prep HPF POC MODERATE (*) NONE SEEN   No results found.  Results for orders placed during the hospital encounter of 02/20/12  URINALYSIS, ROUTINE W REFLEX MICROSCOPIC      Component Value Range   Color, Urine YELLOW  YELLOW   APPearance CLEAR  CLEAR   Specific Gravity, Urine >1.030 (*) 1.005 - 1.030   pH 5.5  5.0 - 8.0   Glucose, UA NEGATIVE  NEGATIVE mg/dL   Hgb urine dipstick NEGATIVE   NEGATIVE   Bilirubin Urine NEGATIVE  NEGATIVE   Ketones, ur 15 (*) NEGATIVE mg/dL   Protein, ur NEGATIVE  NEGATIVE mg/dL   Urobilinogen, UA 0.2  0.0 - 1.0 mg/dL   Nitrite NEGATIVE  NEGATIVE   Leukocytes, UA NEGATIVE  NEGATIVE  POCT PREGNANCY, URINE      Component Value Range   Preg Test, Ur NEGATIVE  NEGATIVE  WET PREP, GENITAL      Component Value Range   Yeast Wet Prep HPF POC NONE SEEN  NONE SEEN   Trich, Wet Prep NONE SEEN  NONE SEEN   Clue Cells Wet Prep HPF POC NONE SEEN  NONE SEEN   WBC, Wet Prep HPF POC MODERATE (*) NONE SEEN   US Transvaginal Non-ob  02/21/2012  *RADIOLOGY REPORT*  Clinical Data:  Pelvic pain.  TRANSABDOMINAL AND TRANSVAGINAL ULTRASOUND OF PELVIS DOPPLER ULTRASOUND OF OVARIES  Technique:  Both transabdominal and transvaginal ultrasound examinations of the pelvis were performed. Transabdominal technique  was performed for global imaging of the pelvis including uterus, ovaries, adnexal regions, and pelvic cul-de-sac.  It was necessary to proceed with endovaginal exam following the transabdominal exam to visualize the adnexa.  Color and duplex Doppler ultrasound was utilized to evaluate blood flow to the ovaries.  Comparison:  None.  Findings:  Uterus:  0.4 cm cyst in the myometrium is identified in the lower uterine segment.  The uterus measures 7.7 x 3.3 x 4.1 cm and is otherwise unremarkable.  Endometrium:  IUD is within the endometrial canal and may extend just beyond the cervix.  Right ovary: Measures 3.6 x 2.7 x 3.2 cm.  There is a cyst with internal echoes measuring 2.4 x 1.7 x 2.2 cm.  Left ovary:   Normal appearance/no adnexal mass  Pulsed Doppler evaluation demonstrates normal low-resistance arterial and venous waveforms in both ovaries.  IMPRESSION:  1.  Distal end of the patients IUD may extend just beyond the cervix.  Recommend correlation with physical examination. 2.  2.4 cm right ovarian cyst appears to contain some hemorrhage. 3.  Negative for ovarian torsion  or abscess.   Original Report Authenticated By: Holley Dexter, M.D.    US Pelvis Complete  02/21/2012  *RADIOLOGY REPORT*  Clinical Data:  Pelvic pain.  TRANSABDOMINAL AND TRANSVAGINAL ULTRASOUND OF PELVIS DOPPLER ULTRASOUND OF OVARIES  Technique:  Both transabdominal and transvaginal ultrasound examinations of the pelvis were performed. Transabdominal technique was performed for global imaging of the pelvis including uterus, ovaries, adnexal regions, and pelvic cul-de-sac.  It was necessary to proceed with endovaginal exam following the transabdominal exam to visualize the adnexa.  Color and duplex Doppler ultrasound was utilized to evaluate blood flow to the ovaries.  Comparison:  None.  Findings:  Uterus:  0.4 cm cyst in the myometrium is identified in the lower uterine segment.  The uterus measures 7.7 x 3.3 x 4.1 cm and is otherwise unremarkable.  Endometrium:  IUD is within the endometrial canal and may extend just beyond the cervix.  Right ovary: Measures 3.6 x 2.7 x 3.2 cm.  There is a cyst with internal echoes measuring 2.4 x 1.7 x 2.2 cm.  Left ovary:   Normal appearance/no adnexal mass  Pulsed Doppler evaluation demonstrates normal low-resistance arterial and venous waveforms in both ovaries.  IMPRESSION:  1.  Distal end of the patients IUD may extend just beyond the cervix.  Recommend correlation with physical examination. 2.  2.4 cm right ovarian cyst appears to contain some hemorrhage. 3.  Negative for ovarian torsion or abscess.   Original Report Authenticated By: Holley Dexter, M.D.    [place] Art/ven Flow Abd Pelv Doppler  02/21/2012  *RADIOLOGY REPORT*  Clinical Data:  Pelvic pain.  TRANSABDOMINAL AND TRANSVAGINAL ULTRASOUND OF PELVIS DOPPLER ULTRASOUND OF OVARIES  Technique:  Both transabdominal and transvaginal ultrasound examinations of the pelvis were performed. Transabdominal technique was performed for global imaging of the pelvis including uterus, ovaries, adnexal regions, and pelvic  cul-de-sac.  It was necessary to proceed with endovaginal exam following the transabdominal exam to visualize the adnexa.  Color and duplex Doppler ultrasound was utilized to evaluate blood flow to the ovaries.  Comparison:  None.  Findings:  Uterus:  0.4 cm cyst in the myometrium is identified in the lower uterine segment.  The uterus measures 7.7 x 3.3 x 4.1 cm and is otherwise unremarkable.  Endometrium:  IUD is within the endometrial canal and may extend just beyond the cervix.  Right ovary: Measures 3.6 x 2.7  x 3.2 cm.  There is a cyst with internal echoes measuring 2.4 x 1.7 x 2.2 cm.  Left ovary:   Normal appearance/no adnexal mass  Pulsed Doppler evaluation demonstrates normal low-resistance arterial and venous waveforms in both ovaries.  IMPRESSION:  1.  Distal end of the patients IUD may extend just beyond the cervix.  Recommend correlation with physical examination. 2.  2.4 cm right ovarian cyst appears to contain some hemorrhage. 3.  Negative for ovarian torsion or abscess.   Original Report Authenticated By: Holley Dexter, M.D.     1. Removal of IUD 2. Ovarian cyst, Right  MDM  Pt reports having vaginal pain and felt her IUD may be out of place.  Pelvic exam significant with R adnexal tenderness but no evidence of IUD being out of place.  Will obtain pelvic US to r/o acute pathology such as ovarian torsion, tuboovarian abscess, or misplaced IUD.  Pt aware of plan.  Care discussed with my attending.   Pt has R adnexal tenderness, mild bleeding from her cervix, and has moderate WBC on wet prep.  Will give rocephin/zithromax as preventative treatment for potential STD.    1:37 AM Pt request to have her IUD removed due to pain and she also does not want to have it anymore.  I have asked pt to f/u with OBGYN to have it removed but she reports not having the money to do so.  I discussed with my attending who agrees that IUD can be remove here in ER.    1:43 AM IUD was successfully removed by  me without complication.  Pt tolerates procedure well.  Pelvic US shows that the distal end of the pt's IUD did extend just beyond the cervix.  Pt also has a R ovarian cyst, with some hemorrhage.  Otherwise negative for ovarian torsion or abscess.  Reassurance given.  Care instruction provided.  Recommend f/u with her OBGYN for further care.  I also made pt aware that her BP is elevated and will need to have it recheck by her PCP.    BP 151/88  Pulse 93  Temp 98.1 F (36.7 C) (Oral)  Resp 18  SpO2 99%  LMP 02/18/2012  I have reviewed nursing notes and vital signs. I personally reviewed the imaging tests through PACS system  I reviewed available ER/hospitalization records thought the EMR    Fayrene Helper, [place] 02/21/12 0155

## 2012-02-20 NOTE — ED Notes (Signed)
Pt is here with vaginal pain that started this am and she thinks her IUD is misplaced.  No vaginal discharge or bleeding

## 2012-02-21 ENCOUNTER — Emergency Department (HOSPITAL_COMMUNITY): Payer: Self-pay

## 2012-02-21 LAB — GC/CHLAMYDIA PROBE AMP: GC Probe RNA: NEGATIVE

## 2012-02-21 MED ORDER — LIDOCAINE HCL 2 % IJ SOLN
INTRAMUSCULAR | Status: AC
  Filled 2012-02-21: qty 20

## 2012-02-21 MED ORDER — CEFTRIAXONE SODIUM 250 MG IJ SOLR
250.0000 mg | Freq: Once | INTRAMUSCULAR | Status: AC
  Administered 2012-02-21: 250 mg via INTRAMUSCULAR
  Filled 2012-02-21: qty 250

## 2012-02-21 MED ORDER — IBUPROFEN 600 MG PO TABS: 600.0000 mg | ORAL_TABLET | Freq: Four times a day (QID) | ORAL | Status: DC | PRN

## 2012-02-21 MED ORDER — AZITHROMYCIN 250 MG PO TABS
1000.0000 mg | ORAL_TABLET | Freq: Once | ORAL | Status: AC
  Administered 2012-02-21: 1000 mg via ORAL
  Filled 2012-02-21: qty 4

## 2012-02-21 MED ORDER — LIDOCAINE-EPINEPHRINE 1 %-1:100000 IJ SOLN
INTRAMUSCULAR | Status: AC
  Filled 2012-02-21: qty 1

## 2012-02-21 NOTE — ED Provider Notes (Signed)
Medical screening examination/treatment/procedure(s) were performed by non-physician practitioner and as supervising physician I was immediately available for consultation/collaboration.  Gerhard Munch, MD 02/21/12 [phone]

## 2012-02-21 NOTE — ED Notes (Signed)
Patient transported to Ultrasound 

## 2012-02-21 NOTE — ED Notes (Signed)
Pelvic set up, IUD removed by provider. Pt tolerated well.

## 2012-02-21 NOTE — ED Notes (Signed)
See paper chart 

## 2012-02-21 NOTE — ED Notes (Signed)
Contact information: Pt states "leave a message on Lucy Jurado's phone when results come in for me to call for my results. Do not leave my results w/Lucy Jurado or on voicemail." [phone]

## 2012-05-09 ENCOUNTER — Emergency Department: Payer: Self-pay | Admitting: Emergency Medicine

## 2012-05-09 LAB — URINALYSIS, COMPLETE
Ketone: NEGATIVE
Nitrite: NEGATIVE
Ph: 5 (ref 4.5–8.0)
Protein: 30
RBC,UR: 182 /HPF (ref 0–5)
Specific Gravity: 1.019 (ref 1.003–1.030)
Squamous Epithelial: 3
WBC UR: 591 /HPF (ref 0–5)

## 2012-12-01 ENCOUNTER — Encounter (HOSPITAL_COMMUNITY): Payer: Self-pay | Admitting: Emergency Medicine

## 2012-12-01 ENCOUNTER — Emergency Department (HOSPITAL_COMMUNITY): Payer: Self-pay

## 2012-12-01 ENCOUNTER — Emergency Department (HOSPITAL_COMMUNITY)
Admission: EM | Admit: 2012-12-01 | Discharge: 2012-12-02 | Disposition: A | Discharge status: Home / Self Care | Payer: Self-pay | Attending: Emergency Medicine | Admitting: Emergency Medicine

## 2012-12-01 DIAGNOSIS — F10929 Alcohol use, unspecified with intoxication, unspecified: Secondary | ICD-10-CM

## 2012-12-01 DIAGNOSIS — S0990XA Unspecified injury of head, initial encounter: Secondary | ICD-10-CM | POA: Insufficient documentation

## 2012-12-01 DIAGNOSIS — F1010 Alcohol abuse, uncomplicated: Secondary | ICD-10-CM | POA: Insufficient documentation

## 2012-12-01 DIAGNOSIS — IMO0002 Reserved for concepts with insufficient information to code with codable children: Secondary | ICD-10-CM | POA: Insufficient documentation

## 2012-12-01 DIAGNOSIS — S8392XA Sprain of unspecified site of left knee, initial encounter: Secondary | ICD-10-CM

## 2012-12-01 DIAGNOSIS — F17200 Nicotine dependence, unspecified, uncomplicated: Secondary | ICD-10-CM | POA: Insufficient documentation

## 2012-12-01 DIAGNOSIS — Y92410 Unspecified street and highway as the place of occurrence of the external cause: Secondary | ICD-10-CM | POA: Insufficient documentation

## 2012-12-01 DIAGNOSIS — Y939 Activity, unspecified: Secondary | ICD-10-CM | POA: Insufficient documentation

## 2012-12-01 LAB — BASIC METABOLIC PANEL
BUN: 7 mg/dL (ref 6–23)
CO2: 21 mEq/L (ref 19–32)
Chloride: 106 mEq/L (ref 96–112)
Creatinine, Ser: 0.63 mg/dL (ref 0.50–1.10)
Potassium: 3.3 mEq/L — ABNORMAL LOW (ref 3.5–5.1)

## 2012-12-01 LAB — CBC
HCT: 42.4 % (ref 36.0–46.0)
MCV: 93.8 fL (ref 78.0–100.0)
RBC: 4.52 MIL/uL (ref 3.87–5.11)
WBC: 17.4 10*3/uL — ABNORMAL HIGH (ref 4.0–10.5)

## 2012-12-01 MED ORDER — SODIUM CHLORIDE 0.9 % IV BOLUS (SEPSIS)
1000.0000 mL | Freq: Once | INTRAVENOUS | Status: AC
  Administered 2012-12-01: 1000 mL via INTRAVENOUS

## 2012-12-01 MED ORDER — IBUPROFEN 200 MG PO TABS
600.0000 mg | ORAL_TABLET | Freq: Once | ORAL | Status: AC
  Administered 2012-12-01: 600 mg via ORAL
  Filled 2012-12-01 (×2): qty 1
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
## 2012-12-01 NOTE — ED Notes (Signed)
Pt in route to Radiology at this time.

## 2012-12-02 NOTE — ED Notes (Signed)
Pt given d/c instructions and verbalized understanding. NAD at this time. VS are stable.  

## 2012-12-02 NOTE — ED Provider Notes (Signed)
I saw and evaluated the patient, reviewed the resident's note and I agree with the findings and plan.   Candyce Churn, MD 12/02/12 [phone]
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
## 2013-02-15 ENCOUNTER — Emergency Department (HOSPITAL_COMMUNITY)
Admission: EM | Admit: 2013-02-15 | Discharge: 2013-02-15 | Disposition: A | Discharge status: Other Acute Inpatient Hospital | Payer: Federal, State, Local not specified - Other | Attending: Emergency Medicine | Admitting: Emergency Medicine

## 2013-02-15 ENCOUNTER — Inpatient Hospital Stay (HOSPITAL_COMMUNITY)
Admission: AD | Admit: 2013-02-15 | Discharge: 2013-02-19 | DRG: 885 | Disposition: A | Discharge status: Home / Self Care | Payer: Federal, State, Local not specified - Other | Source: Intra-hospital | Attending: Psychiatry | Admitting: Psychiatry

## 2013-02-15 ENCOUNTER — Encounter (HOSPITAL_COMMUNITY): Payer: Self-pay | Admitting: Emergency Medicine

## 2013-02-15 ENCOUNTER — Encounter (HOSPITAL_COMMUNITY): Payer: Self-pay | Admitting: *Deleted

## 2013-02-15 ENCOUNTER — Emergency Department (HOSPITAL_COMMUNITY): Payer: Medicaid Other

## 2013-02-15 DIAGNOSIS — F329 Major depressive disorder, single episode, unspecified: Secondary | ICD-10-CM | POA: Diagnosis present

## 2013-02-15 DIAGNOSIS — I10 Essential (primary) hypertension: Secondary | ICD-10-CM | POA: Diagnosis present

## 2013-02-15 DIAGNOSIS — S0003XA Contusion of scalp, initial encounter: Secondary | ICD-10-CM | POA: Insufficient documentation

## 2013-02-15 DIAGNOSIS — F1010 Alcohol abuse, uncomplicated: Secondary | ICD-10-CM | POA: Insufficient documentation

## 2013-02-15 DIAGNOSIS — R45851 Suicidal ideations: Secondary | ICD-10-CM | POA: Insufficient documentation

## 2013-02-15 DIAGNOSIS — F1210 Cannabis abuse, uncomplicated: Secondary | ICD-10-CM

## 2013-02-15 DIAGNOSIS — IMO0002 Reserved for concepts with insufficient information to code with codable children: Secondary | ICD-10-CM | POA: Insufficient documentation

## 2013-02-15 DIAGNOSIS — F39 Unspecified mood [affective] disorder: Secondary | ICD-10-CM

## 2013-02-15 DIAGNOSIS — F332 Major depressive disorder, recurrent severe without psychotic features: Principal | ICD-10-CM | POA: Diagnosis present

## 2013-02-15 DIAGNOSIS — S1093XA Contusion of unspecified part of neck, initial encounter: Principal | ICD-10-CM

## 2013-02-15 DIAGNOSIS — S0990XA Unspecified injury of head, initial encounter: Secondary | ICD-10-CM | POA: Insufficient documentation

## 2013-02-15 DIAGNOSIS — F10929 Alcohol use, unspecified with intoxication, unspecified: Secondary | ICD-10-CM

## 2013-02-15 DIAGNOSIS — F17200 Nicotine dependence, unspecified, uncomplicated: Secondary | ICD-10-CM | POA: Insufficient documentation

## 2013-02-15 DIAGNOSIS — F411 Generalized anxiety disorder: Secondary | ICD-10-CM | POA: Diagnosis present

## 2013-02-15 DIAGNOSIS — F988 Other specified behavioral and emotional disorders with onset usually occurring in childhood and adolescence: Secondary | ICD-10-CM | POA: Insufficient documentation

## 2013-02-15 DIAGNOSIS — Z79899 Other long term (current) drug therapy: Secondary | ICD-10-CM

## 2013-02-15 DIAGNOSIS — S0083XA Contusion of other part of head, initial encounter: Secondary | ICD-10-CM

## 2013-02-15 DIAGNOSIS — G4700 Insomnia, unspecified: Secondary | ICD-10-CM | POA: Diagnosis present

## 2013-02-15 DIAGNOSIS — F1994 Other psychoactive substance use, unspecified with psychoactive substance-induced mood disorder: Secondary | ICD-10-CM

## 2013-02-15 DIAGNOSIS — Z3202 Encounter for pregnancy test, result negative: Secondary | ICD-10-CM | POA: Insufficient documentation

## 2013-02-15 DIAGNOSIS — T7492XA Unspecified child maltreatment, confirmed, initial encounter: Secondary | ICD-10-CM | POA: Insufficient documentation

## 2013-02-15 DIAGNOSIS — T7491XA Unspecified adult maltreatment, confirmed, initial encounter: Secondary | ICD-10-CM | POA: Insufficient documentation

## 2013-02-15 HISTORY — DX: Other specified behavioral and emotional disorders with onset usually occurring in childhood and adolescence: F98.8

## 2013-02-15 HISTORY — DX: Essential (primary) hypertension: I10

## 2013-02-15 HISTORY — DX: Anxiety disorder, unspecified: F41.9

## 2013-02-15 LAB — RAPID URINE DRUG SCREEN, HOSP PERFORMED
Amphetamines: NOT DETECTED
Barbiturates: NOT DETECTED
Benzodiazepines: NOT DETECTED
COCAINE: NOT DETECTED
Opiates: NOT DETECTED
TETRAHYDROCANNABINOL: POSITIVE — AB

## 2013-02-15 LAB — COMPREHENSIVE METABOLIC PANEL
ALBUMIN: 4.4 g/dL (ref 3.5–5.2)
ALT: 15 U/L (ref 0–35)
AST: 19 U/L (ref 0–37)
Alkaline Phosphatase: 59 U/L (ref 39–117)
BILIRUBIN TOTAL: 0.3 mg/dL (ref 0.3–1.2)
BUN: 9 mg/dL (ref 6–23)
CO2: 24 mEq/L (ref 19–32)
CREATININE: 0.76 mg/dL (ref 0.50–1.10)
Calcium: 9.2 mg/dL (ref 8.4–10.5)
Chloride: 103 mEq/L (ref 96–112)
GFR calc non Af Amer: >90 mL/min (ref >90)
GLUCOSE: 106 mg/dL — AB (ref 70–99)
Potassium: 3.7 mEq/L (ref 3.7–5.3)
Sodium: 142 mEq/L (ref 137–147)
Total Protein: 7.4 g/dL (ref 6.0–8.3)

## 2013-02-15 LAB — CBC WITH DIFFERENTIAL/PLATELET
BASOS PCT: 0 % (ref 0–1)
Basophils Absolute: 0 10*3/uL (ref 0.0–0.1)
EOS ABS: 0 10*3/uL (ref 0.0–0.7)
Eosinophils Relative: 0 % (ref 0–5)
HEMATOCRIT: 41.8 % (ref 36.0–46.0)
HEMOGLOBIN: 14.6 g/dL (ref 12.0–15.0)
Lymphocytes Relative: 21 % (ref 12–46)
Lymphs Abs: 1.9 10*3/uL (ref 0.7–4.0)
MCH: 32.7 pg (ref 26.0–34.0)
MCHC: 34.9 g/dL (ref 30.0–36.0)
MCV: 93.7 fL (ref 78.0–100.0)
MONOS PCT: 5 % (ref 3–12)
Monocytes Absolute: 0.4 10*3/uL (ref 0.1–1.0)
Neutro Abs: 6.6 10*3/uL (ref 1.7–7.7)
Neutrophils Relative %: 74 % (ref 43–77)
Platelets: 250 10*3/uL (ref 150–400)
RBC: 4.46 MIL/uL (ref 3.87–5.11)
RDW: 12.4 % (ref 11.5–15.5)
WBC: 8.9 10*3/uL (ref 4.0–10.5)

## 2013-02-15 LAB — PREGNANCY, URINE: Preg Test, Ur: NEGATIVE

## 2013-02-15 LAB — ETHANOL: Alcohol, Ethyl (B): 168 mg/dL — ABNORMAL HIGH (ref 0–11)

## 2013-02-15 MED ORDER — BACITRACIN-NEOMYCIN-POLYMYXIN OINTMENT TUBE
TOPICAL_OINTMENT | CUTANEOUS | Status: DC | PRN
  Administered 2013-02-15: 22:00:00 via TOPICAL
  Filled 2013-02-15: qty 1
  Filled 2013-02-15: qty 15

## 2013-02-15 MED ORDER — IBUPROFEN 200 MG PO TABS
600.0000 mg | ORAL_TABLET | Freq: Three times a day (TID) | ORAL | Status: DC | PRN
Start: 2013-02-15 — End: 2013-02-15
  Administered 2013-02-15 (×2): 600 mg via ORAL
  Filled 2013-02-15: qty 1
  Filled 2013-02-15 (×2): qty 3

## 2013-02-15 MED ORDER — BACIT-POLY-NEO HC 1 % EX OINT: TOPICAL_OINTMENT | CUTANEOUS | Status: DC | PRN

## 2013-02-15 MED ORDER — ONDANSETRON HCL 4 MG PO TABS
4.0000 mg | ORAL_TABLET | Freq: Three times a day (TID) | ORAL | Status: DC | PRN
  Filled 2013-02-15: qty 1

## 2013-02-15 MED ORDER — BACITRACIN-NEOMYCIN-POLYMYXIN 400-5-5000 EX OINT
TOPICAL_OINTMENT | CUTANEOUS | Status: AC
  Administered 2013-02-15: 17:00:00
  Filled 2013-02-15: qty 1

## 2013-02-15 MED ORDER — ALUM & MAG HYDROXIDE-SIMETH 200-200-20 MG/5ML PO SUSP
30.0000 mL | ORAL | Status: DC | PRN
  Filled 2013-02-15: qty 30

## 2013-02-15 MED ORDER — HYDROXYZINE HCL 10 MG PO TABS
10.0000 mg | ORAL_TABLET | Freq: Three times a day (TID) | ORAL | Status: DC
  Administered 2013-02-15: 10 mg via ORAL
  Filled 2013-02-15 (×5): qty 1

## 2013-02-15 MED ORDER — INFLUENZA VAC SPLIT QUAD 0.5 ML IM SUSP
0.5000 mL | INTRAMUSCULAR | Status: AC
  Administered 2013-02-16: 0.5 mL via INTRAMUSCULAR
  Filled 2013-02-15: qty 0.5

## 2013-02-15 MED ORDER — ZOLPIDEM TARTRATE 5 MG PO TABS: 5.0000 mg | ORAL_TABLET | Freq: Every evening | ORAL | Status: DC | PRN

## 2013-02-15 MED ORDER — ACETAMINOPHEN 325 MG PO TABS
650.0000 mg | ORAL_TABLET | Freq: Four times a day (QID) | ORAL | Status: DC | PRN
  Administered 2013-02-15 – 2013-02-17 (×2): 650 mg via ORAL
  Filled 2013-02-15 (×2): qty 2

## 2013-02-15 MED ORDER — MAGNESIUM HYDROXIDE 400 MG/5ML PO SUSP: 30.0000 mL | Freq: Every day | ORAL | Status: DC | PRN

## 2013-02-15 MED ORDER — ALUM & MAG HYDROXIDE-SIMETH 200-200-20 MG/5ML PO SUSP: 30.0000 mL | ORAL | Status: DC | PRN

## 2013-02-15 NOTE — Tx Team (Signed)
Initial Interdisciplinary Treatment Plan  PATIENT STRENGTHS: (choose at least two) Ability for insight Average or above average intelligence Capable of independent living Communication skills General fund of knowledge Motivation for treatment/growth Physical Health Supportive family/friends  PATIENT STRESSORS: Financial difficulties Legal issue Marital or family conflict Substance abuse   PROBLEM LIST: Problem List/Patient Goals Date to be addressed Date deferred Reason deferred Estimated date of resolution  Suicidal Ideation, Depression ( Pt reports increasing depression and Suicidal ideations stating "I just cant do this anymore".    Discharge                                                   DISCHARGE CRITERIA:  Adequate post-discharge living arrangements Improved stabilization in mood, thinking, and/or behavior Medical problems require only outpatient monitoring Motivation to continue treatment in a less acute level of care Need for constant or close observation no longer present Reduction of life-threatening or endangering symptoms to within safe limits Verbal commitment to aftercare and medication compliance  PRELIMINARY DISCHARGE PLAN: Outpatient therapy  PATIENT/FAMIILY INVOLVEMENT: This treatment plan has been presented to and reviewed with the patient, [name], and/or family member.  The patient and family have been given the opportunity to ask questions and make suggestions.  Inda MerlinWilliams, Alvah Gilder R 02/15/2013, 1:49 PM
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
## 2013-02-15 NOTE — Progress Notes (Signed)
Writer spoke with patient and she c/o feeling very nervous and jittery. Patient has abrahions to her right forearm that she c/o hurting when she was lying down earlier. Writer encouraged patient to eat a snack before taking her medication which she did. Patient's affect is flat and sad. She received visteril and tylenol before returning to her room to lie down. Patient currently denies si/hi/a/v hallucinations.

## 2013-02-15 NOTE — ED Notes (Signed)
Pt belongings  1 pair black pants 1 black bra 1 pair purple underwear 1 leopard print shirt 1 pair black socks 1 driver's licence
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
## 2013-02-15 NOTE — ED Provider Notes (Signed)
Medical screening examination/treatment/procedure(s) were conducted as a shared visit with non-physician practitioner(s) and myself.  I personally evaluated the patient during the encounter.  EKG Interpretation   None       Patient with alcohol intoxication.  Now reports vague SI.  We will ask the behavioral health team to evaluate the patient  Lyanne CoKevin M Imya Mance, MD 02/15/13 [phone]

## 2013-02-15 NOTE — ED Notes (Signed)
Patient arrived to unit, no s/s of distress noted at this time. Pt endorsing depression with a dull, flat affect.
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
## 2013-02-15 NOTE — ED Notes (Signed)
Pt reports hx of cutting herself.  At this time pt is verbalizing SI w/o a plan.

## 2013-02-15 NOTE — [facility] Group Notes (Signed)
[facility] Group Notes:  (Clinical Social Work)  02/15/2013   3:00-4:00PM  Summary of Progress/Problems:   The main focus of today's process group was for the patient to identify ways in which they have sabotaged their own mental health wellness/recovery.  Motivational interviewing was used to explore the reasons they engage in this behavior, and reasons they may have for wanting to change.  The Stages of Change were explained to the group using a handout, and patients identified where they are with regard to changing self-defeating behaviors.  The patient expressed that she has a lack of self-worth, feels incapable, believes that due to her ADHD she has a lack of common sense.  Type of Therapy:  Process Group  Participation Level:  Active  Participation Quality:  Attentive  Affect:  Anxious and Depressed  Cognitive:  Appropriate and Oriented  Insight:  Developing/Improving  Engagement in Therapy:  Engaged  Modes of Intervention:  Education, Motivational Interviewing   Ambrose MantleMareida Grossman-Orr, LCSW 02/15/2013, 4:00pm

## 2013-02-15 NOTE — ED Notes (Signed)
Pt verbally contracts for safety.
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
## 2013-02-15 NOTE — ED Notes (Signed)
Cleansed abrasion to b/l knees and right forearm.  Changed pt into paper scrubs.  Security en route to want pt.

## 2013-02-15 NOTE — ED Notes (Addendum)
Disposition: Patient meets inpatient criteria at this time after consulting with Alberteen SamFran Hobson, NP. TTS staff will seek placement at surrounding facilities [facility].    Janann ColonelGregory Pickett Jr. MSW, LCSWA Therapeutic Triage Services-Triage Specialist   Phone: [phone] Fax: [phone]

## 2013-02-15 NOTE — Progress Notes (Signed)
Pt going to [facility] and is accepted by and to Dr. Elsie SaasJonnalagadda and [name]  Pt can arrive at 1245  Nurse will call report [phone]

## 2013-02-16 ENCOUNTER — Encounter (HOSPITAL_COMMUNITY): Payer: Self-pay | Admitting: Psychiatry

## 2013-02-16 DIAGNOSIS — F411 Generalized anxiety disorder: Secondary | ICD-10-CM

## 2013-02-16 DIAGNOSIS — F988 Other specified behavioral and emotional disorders with onset usually occurring in childhood and adolescence: Secondary | ICD-10-CM

## 2013-02-16 DIAGNOSIS — F1010 Alcohol abuse, uncomplicated: Secondary | ICD-10-CM

## 2013-02-16 DIAGNOSIS — F329 Major depressive disorder, single episode, unspecified: Secondary | ICD-10-CM | POA: Diagnosis present

## 2013-02-16 MED ORDER — BACITRACIN-NEOMYCIN-POLYMYXIN 400-5-5000 EX OINT
TOPICAL_OINTMENT | Freq: Every day | CUTANEOUS | Status: DC
  Administered 2013-02-16 – 2013-02-19 (×4): via TOPICAL

## 2013-02-16 MED ORDER — CHLORDIAZEPOXIDE HCL 25 MG PO CAPS
25.0000 mg | ORAL_CAPSULE | Freq: Four times a day (QID) | ORAL | Status: DC | PRN
  Administered 2013-02-16 – 2013-02-17 (×2): 25 mg via ORAL
  Filled 2013-02-16 (×2): qty 1

## 2013-02-16 MED ORDER — HYDROXYZINE HCL 25 MG PO TABS
25.0000 mg | ORAL_TABLET | Freq: Three times a day (TID) | ORAL | Status: AC
  Administered 2013-02-16 – 2013-02-17 (×5): 25 mg via ORAL
  Filled 2013-02-16 (×5): qty 1

## 2013-02-16 MED ORDER — SERTRALINE HCL 25 MG PO TABS
25.0000 mg | ORAL_TABLET | Freq: Every day | ORAL | Status: DC
  Administered 2013-02-16 – 2013-02-17 (×2): 25 mg via ORAL
  Filled 2013-02-16 (×4): qty 1
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
## 2013-02-16 NOTE — Progress Notes (Signed)
Psychoeducational Group Note  Date: 02/16/2013 Time:  0930 Group Topic/Focus:  Gratefulness:  The focus of this group is to help patients identify what two things they are most grateful for in their lives. What helps ground them and to center them on their work to their recovery.  Participation Level:  Active  Participation Quality:  Appropriate  Affect:  Appropriate  Cognitive:  Oriented  Insight:  Improving  Engagement in Group:  Engaged  Additional Comments:  Pt. Was active in paying attention and participating.  Lavaughn Bisig A  
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
## 2013-02-16 NOTE — Progress Notes (Signed)
Psychoeducational Group Note  Date:  02/16/2013 Time:  1015  Group Topic/Focus:  Making Healthy Choices:   The focus of this group is to help patients identify negative/unhealthy choices they were using prior to admission and identify positive/healthier coping strategies to replace them upon discharge.  Participation Level:  Active  Participation Quality:  Appropriate  Affect:  Appropriate  Cognitive:  Oriented  Insight:  Improving  Engagement in Group:  Engaged  Additional Comments:  Pt has been very much a part of the group with interaction, comments insights and questions.  Mikisha Roseland A 02/16/2013 

## 2013-02-16 NOTE — Progress Notes (Signed)
[facility] Group Notes:  (Nursing/MHT/Case Management/Adjunct)  Date:  02/16/2013  Time:  8:00 p.m.   Type of Therapy:  Psychoeducational Skills  Participation Level:  Active  Participation Quality:  Appropriate  Affect:  Appropriate  Cognitive:  Appropriate  Insight:  Improving  Engagement in Group:  Developing/Improving  Modes of Intervention:  Education  Summary of Progress/Problems: The patient shared in group this evening that she had a better day compared to yesterday. First of all, she mentioned that her peers made her feel more welcome on the unit. Secondly, she verbalized that she felt safer today. Finally, the patient expressed that her mood improved after a family member brought in some clothing for her. In terms of the theme of the day, she indicated that her daughters and grandmother will be her support system.   Hazle CocaGOODMAN, Tekila Caillouet S 02/16/2013, 9:51 PM
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
## 2013-02-17 MED ORDER — HYDROXYZINE HCL 25 MG PO TABS
25.0000 mg | ORAL_TABLET | Freq: Four times a day (QID) | ORAL | Status: DC | PRN
  Administered 2013-02-17: 25 mg via ORAL
  Filled 2013-02-17: qty 1

## 2013-02-17 MED ORDER — BUPROPION HCL ER (XL) 150 MG PO TB24
150.0000 mg | ORAL_TABLET | Freq: Every day | ORAL | Status: DC
  Administered 2013-02-17 – 2013-02-19 (×3): 150 mg via ORAL
  Filled 2013-02-17 (×5): qty 1

## 2013-02-17 NOTE — [facility] Group Notes (Signed)
[facility]   02/17/2013 10:51 AM    Participation Quality:  Appropraite  Mood/Affect:  Appropriate  Depression Rating:  3  Anxiety Rating:  5  Thoughts of Suicide:  No  Will you contract for safety?   NA  Current AVH:  No  Plan for Discharge/Comments:  Patient attended discharge planning group and actively participated in group.  She will need a referral for outpatient service.  CSW provided all participants with daily workbook.   Transportation Means: Patient has transportation.   Supports:  Patient has a support system.   Daeron Carreno, Joesph JulyQuylle Hairston
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
## 2013-02-17 NOTE — Progress Notes (Signed)
Recreation Therapy Notes  Date: 01.05.2015 Time: 3:00pm Location: [address] Day[address]   Group Topic: Wellness  Goal Area(s) Addresses:  Patient will define components of whole wellness. Patient will verbalize benefit of whole wellness.  Behavioral Response: Appropriate  Intervention: Mind Map.  Activity: As a group patients were asked to identify and define the eight components of whole wellness - Physical, Mental, Emotional, Social, Environmental, Intellectual, Leisure, and Spiritual. Individually patients were asked to identify ways they are investing in each component of whole wellness.   Education: Wellness, Building control surveyorDischarge Planning, Coping Skills  Education Outcome: Acknowledges understanding   Clinical Observations/Feedback: Patient engaged in group session, completing worksheet as requested. Patient made no statements or contributions during group session. Patient was observed to color a coloring page following the completion of her worksheet.   Marykay Lexenise L Nicklos Gaxiola, LRT/CTRS  Jearl KlinefelterBlanchfield, Daniyla Pfahler L 02/17/2013 4:50 PM

## 2013-02-17 NOTE — Progress Notes (Addendum)
D:  Patient's self inventory sheet, patient sleeps well, good appetite, improving appetite, normal energy level, improving attention span.  Rated depression and hopeless #2, anxiety #4.  Denied withdrawals.  Denied SI.   Has felt lightheaded, dizzy in past 24 hours.  Worst pain #2 in past 24 hours.  After discharge, plans to take meds and go to therapy referral.  Does have discharge plans.  No problems taking meds after discharge. A:  Medications administered per MD orders.  Emotional support and encouragement given patient. R:  Denied SI and HI.  Denied A/V hallucinations.  Denied pain at this time.  Will continue to monitor patient for safety with 15 minute checks.  Safety maintained.
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
## 2013-02-17 NOTE — [facility] Group Notes (Signed)
[facility] LCSW Group Therapy          Overcoming Obstacles       1:15 -2:30        02/17/2013   2:33 PM     Type of Therapy:  Group Therapy  Participation Level:  Appropriate  Participation Quality:  Appropriate  Affect:  Appropriate, Alert  Cognitive:  Attentive Appropriate  Insight: Developing/Improving Engaged  Engagement in Therapy: Developing/Imprvoing Engaged  Modes of Intervention:  Discussion Exploration  Education Rapport BuildingProblem-Solving Support  Summary of Progress/Problems:  The main focus of today's group was overcoming obstacles.  She advised the obstacle she needs to overcome is getting her life together.  She shared her grandmother is willing for her to come live with her.  She also shared plans to go back to school so that she can get a better job.    [name], [name] 02/17/2013    2:33 PM

## 2013-02-18 MED ORDER — CLONAZEPAM 0.5 MG PO TABS
0.5000 mg | ORAL_TABLET | Freq: Two times a day (BID) | ORAL | Status: DC
  Administered 2013-02-18 – 2013-02-19 (×2): 0.5 mg via ORAL
  Filled 2013-02-18 (×2): qty 1

## 2013-02-18 MED ORDER — TRAZODONE HCL 50 MG PO TABS
50.0000 mg | ORAL_TABLET | Freq: Every day | ORAL | Status: DC
  Administered 2013-02-18: 50 mg via ORAL
  Filled 2013-02-18 (×2): qty 1

## 2013-02-18 NOTE — [facility] Group Notes (Signed)
Adult Psychoeducational Group Note  Date:  02/18/2013 Time:  10:21 PM  Group Topic/Focus:  Wrap-Up Group:   The focus of this group is to help patients review their daily goal of treatment and discuss progress on daily workbooks.  Participation Level:  Active  Participation Quality:  Appropriate  Affect:  Appropriate  Cognitive:  Appropriate  Insight: Good  Engagement in Group:  Engaged  Modes of Intervention:  Discussion  Additional Comments:  Patient stated that her goal was to learn to communicate and to go home. Patient engaged appropriately and offered support to peers when needed.   Delia ChimesRufus, Danielly Ackerley L 02/18/2013, 10:21 PM
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
## 2013-02-18 NOTE — Progress Notes (Signed)
The focus of this group is to help patients review their daily goal of treatment and discuss progress on daily workbooks. Pt attended the evening group session and responded to all discussion prompts from the Writer. Pt shared that today was a good day on account of "finally being put on the right medication." Pt said that she felt that her mood had immediatly begun to stabilize and that she felt much better. Pt also cited her time in the gym with her peers as a highlight from the day. Pt's affect was appropriate.

## 2013-02-18 NOTE — [facility] Group Notes (Signed)
[facility] LCSW Group Therapy  02/18/2013  1:15 PM   Type of Therapy:  Group Therapy  Participation Level:  Active  Participation Quality:  Attentive, Sharing and Supportive  Affect:  Depressed and Flat  Cognitive:  Alert and Oriented  Insight:  Developing/Improving and Engaged  Engagement in Therapy:  Developing/Improving and Engaged  Modes of Intervention:  Clarification, Confrontation, Discussion, Education, Exploration, Limit-setting, Orientation, Problem-solving, Rapport Building, Dance movement psychotherapisteality Testing, Socialization and Support  Summary of Progress/Problems: The topic for group therapy was feelings about diagnosis.  Pt actively participated in group discussion on their past and current diagnosis and how they feel towards this.  Pt also identified how society and family members judge them, based on their diagnosis as well as stereotypes and stigmas.  Pt states that she's been diagnosed with depression, anxiety and ADHD.  Pt states that she's learning how to cope with these diagnoses, stating that she has felt supported while being here in the hospital.  Pt actively participated and was engaged in group discussion.     Jillian IvanChelsea Horton, LCSW 02/18/2013 2:14 PM

## 2013-02-18 NOTE — Progress Notes (Signed)
The focus of this group is to educate the patient on the purpose and policies of crisis stabilization and provide a format to answer questions about their admission.  The group details unit policies and expectations of patients while admitted.  Patient attended 0900 nurse education orientation group this morning.  Patient listened attentively.   Appropriate affect, alert, appropriate insight and engagement.  Today patient will work on 3 goals for discharge.  

## 2013-02-18 NOTE — Progress Notes (Signed)
Recreation Therapy Notes  Animal-Assisted Activity/Therapy (AAA/T) Program Checklist/Progress Notes Patient Eligibility Criteria Checklist & Daily Group note for Rec Tx Intervention  Date: 01.06.2015 Time: 2:45pm [address]    AAA/T Program Assumption of Risk Form signed by Patient/ or Parent Legal Guardian yes  Patient is free of allergies or sever asthma yes  Patient reports no fear of animals yes  Patient reports no history of cruelty to animals yes   Patient understands his/her participation is voluntary yes  Patient washes hands before animal contact yes  Patient washes hands after animal contact yes  Behavioral Response: Appropriate, Attentive  Education: Hand Washing, Appropriate Animal Interaction   Education Outcome: Acknowledges understanding   Clinical Observations/Feedback: Patient interacted appropriately with therapy dog team and peers.   Marykay Lexenise L Nyliah Nierenberg, LRT/CTRS  Jearl KlinefelterBlanchfield, Asalee Barrette L 02/18/2013 4:33 PM

## 2013-02-18 NOTE — Progress Notes (Signed)
Recreation Therapy Notes  INPATIENT RECREATION THERAPY ASSESSMENT  Patient Stressors: Family, Relationship, Death  Coping Skills: Isolate, Arguments, Avoidance, Art/Dance, Talking, Music  Leisure Interests: Arts Chief of Staff& Crafts, AnimatorComputer (social media), Family Activities, Listening to Music, Movies, Social Activities, Video Games, Walking, Writing  Personal Challenges:  Communication, Concentration, Decision-Making, Expressing Yourself, Problem-Solving, Relationships, School Performances, Self-Esteem/Confidence, Social Interaction, Stress Management, Trusting Others, Work International aid/development workererformance  Patient indicated she does not have a physical disability that would prevent participating in recreation therapy group sessions.  Patient listed the following strengths: NONE  Patient indicated she would like to change the following about herself: "Everything"  Patient listed the following current recreation interests:  Basketball or Playing Games       Patient goal for hospitalization: "To get out, take meds and seek help."  [employer] Specialty ChemicalsDenise L Brexlee Heberlein, LRT/CTRS  Garon Melander L 02/18/2013 1:17 PM

## 2013-02-18 NOTE — Progress Notes (Signed)
D:  Patient's self inventory sheet, patient has fair sleep, good appetite, normal energy level, poor attention span.  Denied depression and hopelessness.  Denied withdrawals.  Denied SI.  Has experienced lightheadedness and dizziness.  Zero pain and zero pain goal.  After discharge, plans to take medicine, follow up with therapy.  Needs ADD medicine.  Feels she cannot focus. A:  Medications administered per MD orders.  Emotional support and encouragement given patient. R:  Denied SI and HI.  Denied A/V hallucinations.  Denied pain.   Will continue to monitor patient for safety with 15 minute checks.  Safety maintained.

## 2013-02-19 MED ORDER — TRAZODONE HCL 50 MG PO TABS: 50.0000 mg | ORAL_TABLET | Freq: Every evening | ORAL | Status: DC | PRN

## 2013-02-19 MED ORDER — BUPROPION HCL ER (XL) 150 MG PO TB24: 150.0000 mg | ORAL_TABLET | Freq: Every day | ORAL | Status: DC

## 2013-02-19 MED ORDER — BACITRACIN-NEOMYCIN-POLYMYXIN OINTMENT TUBE: 1.0000 "application " | TOPICAL_OINTMENT | Freq: Two times a day (BID) | CUTANEOUS | Status: DC

## 2013-02-19 MED ORDER — CLONAZEPAM 0.5 MG PO TABS: 0.5000 mg | ORAL_TABLET | Freq: Two times a day (BID) | ORAL | Status: DC | PRN

## 2013-02-19 MED ORDER — POLYETHYLENE GLYCOL 3350 17 G PO PACK
17.0000 g | PACK | Freq: Every day | ORAL | Status: DC
  Administered 2013-02-19: 17 g via ORAL
  Filled 2013-02-19 (×2): qty 1

## 2013-02-19 NOTE — [facility] Group Notes (Signed)
[facility]   02/19/2013 10:02 AM    Participation Quality:  Appropraite  Mood/Affect:  Appropriate  Depression Rating:  0  Anxiety Rating:  0  Thoughts of Suicide:  No  Will you contract for safety?   NA  Current AVH:  No  Plan for Discharge/Comments:  Patient attended discharge planning group and actively participated in group. She reports doing well and hopes to discharge home today.  She will follow up with [facility].  CSW provided all participants with daily workbook.   Transportation Means: Patient has transportation.   Supports:  Patient has a support system.   Bradrick Kamau, Joesph JulyQuylle Hairston

## 2013-02-19 NOTE — [facility] Suicide Risk Assessment (Signed)
[facility] INPATIENT:  Family/Significant Other Suicide Prevention Education  Suicide Prevention Education:  Education Completed; Suzanne BoronJoanne Blackburn, Gearldine ShownGrandmother, [phone]; has been identified by the patient as the family member/significant other with whom the patient will be residing, and identified as the person(s) who will aid the patient in the event of a mental health crisis (suicidal ideations/suicide attempt).  With written consent from the patient, the family member/significant other has been provided the following suicide prevention education, prior to the and/or following the discharge of the patient.  The suicide prevention education provided includes the following:  Suicide risk factors  Suicide prevention and interventions  National Suicide Hotline telephone number  [facility] assessment telephone number  [facility] Emergency Assistance 911  [facility] and/or Residential Mobile Crisis Unit telephone number  Request made of family/significant other to:  Remove weapons (e.g., guns, rifles, knives), all items previously/currently identified as safety concern.  Grandmother advised patient does not have access to weapons.     Remove drugs/medications (over-the-counter, prescriptions, illicit drugs), all items previously/currently identified as a safety concern.  The family member/significant other verbalizes understanding of the suicide prevention education information provided.  The family member/significant other agrees to remove the items of safety concern listed above.  Wynn BankerHodnett, Samyak Sackmann Hairston 02/19/2013, 8:33 AM

## 2013-02-19 NOTE — Tx Team (Signed)
Interdisciplinary Treatment Plan Update   Date Reviewed:  02/19/2013  Time Reviewed:  9:42 AM  Progress in Treatment:   Attending groups: Yes Participating in groups: Yes Taking medication as prescribed: Yes  Tolerating medication: Yes Family/Significant other contact made: Yes, contact made with grandmother Patient understands diagnosis: Yes  Discussing patient identified problems/goals with staff: Yes Medical problems stabilized or resolved: Yes Denies suicidal/homicidal ideation: Yes Patient has not harmed self or others: Yes  For review of initial/current patient goals, please see plan of care.  Estimated Length of Stay:  Discharge today  Reasons for Continued Hospitalization:   New Problems/Goals identified:    Discharge Plan or Barriers:   Home with outpatient follow up  Additional Comments:  Attendees:  Patient:  02/19/2013 9:42 AM   Signature: Mervyn GayJ. Jonnalagadda, MD 02/19/2013 9:42 AM  Signature:   Claudette Headonrad Withrow, PA 02/19/2013 9:42 AM  Signature: Harold Barbanonecia Byrd, RN 02/19/2013 9:42 AM  Signature 02/19/2013 9:42 AM  Signature:   02/19/2013 9:42 AM  Signature:  Juline PatchQuylle Ann Bohne, LCSW 02/19/2013 9:42 AM  Signature:  Reyes Ivanhelsea Horton, LCSW 02/19/2013 9:42 AM  Signature:  Leisa LenzValerie Enoch, Care Coordinator 02/19/2013 9:42 AM  Signature:  Aloha GellKrista Dopson, RN 02/19/2013 9:42 AM  Signature: Leighton ParodyBritney Tyson, RN 02/19/2013  9:42 AM   02/19/2013  9:42 AM   02/19/2013  9:42 AM    Scribe for Treatment Team:   Juline PatchQuylle Ollivander See,  02/19/2013 9:42 AM
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
## 2013-02-19 NOTE — Progress Notes (Signed)
[facility] :  Will you be returning to the same living situation after discharge: No.  Patient will be living with grandmother. At discharge, do you have transportation home?:Yes,  Patient has transportation home. Do you have the ability to pay for your medications:No. Patient needs assistance with indigent medications   Release of information consent forms completed and in the chart;  Patient's signature needed at discharge.  Patient to Follow up at: Follow-up Information   Follow up with [facility] On 02/20/2013. (Please go to [facility]'s walk in clinic on Thursday, February 20, 2013  or any weekday between 8AM - 3PM for outpatient follow up.)    Contact information:   [address] [place], [place]    [address]  [phone]      Patient denies SI/HI:  Patient no longer endorsing SI/HI or other thoughts of self harm.  Safety Planning and Suicide Prevention discussed:  .Reviewed with all patients during discharge planning group   [name] 02/19/2013, 10:02 AM

## 2013-02-19 NOTE — Progress Notes (Signed)
D: Patient in hte hallway on approach.  Patient states she is feeling much better today.  Patient engages in conversation with Clinical research associatewriter.  Patient appears anxious tonight.  Patient states she hopes to start planning for discharge.  Patient states she is going to live with her grandmother.  Patient denies SI/HI and denies AVH. A: Staff to monitor Q 15 mins for safety.  Encouragement and support offered.  Scheduled medications administered per orders. R: Patient remains safe on the unit.  Patient attended group tonight.  Patient visible on the unit and interacting with peers.  Patient taking administered medications.
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
## 2013-02-19 NOTE — Progress Notes (Signed)
Discharge Note: Discharge instructions/prescriptions/medication samples given to patient. Patient verbalized understanding of discharge instructions and prescriptions. Returned belongings to patient. Denies SI/HI/AVH. Patient d/c without incident to the lobby. 

## 2013-02-24 NOTE — Progress Notes (Signed)
Patient Discharge Instructions:  After Visit Summary (AVS):   Faxed to:  02/24/13 Discharge Summary Note:   Faxed to:  02/24/13 Psychiatric Admission Assessment Note:   Faxed to:  02/24/13 Suicide Risk Assessment - Discharge Assessment:   Faxed to:  02/24/13 Faxed/Sent to the Next Level Care provider:  02/24/13 [facility] [phone]  Jerelene ReddenSheena E [place], 02/24/2013, 4:02 PM

## 2013-08-06 ENCOUNTER — Emergency Department: Payer: Self-pay | Admitting: Emergency Medicine

## 2013-08-06 LAB — URINALYSIS, COMPLETE
BACTERIA: NONE SEEN
Bilirubin,UR: NEGATIVE
Blood: NEGATIVE
GLUCOSE, UR: NEGATIVE mg/dL (ref 0–75)
KETONE: NEGATIVE
Leukocyte Esterase: NEGATIVE
NITRITE: NEGATIVE
Ph: 5 (ref 4.5–8.0)
Protein: NEGATIVE
RBC,UR: NONE SEEN /HPF (ref 0–5)
Specific Gravity: 1.017 (ref 1.003–1.030)
Squamous Epithelial: 3
WBC UR: NONE SEEN /HPF (ref 0–5)

## 2013-12-14 ENCOUNTER — Encounter (HOSPITAL_COMMUNITY): Payer: Self-pay | Admitting: *Deleted

## 2013-12-14 ENCOUNTER — Emergency Department (HOSPITAL_COMMUNITY)
Admission: EM | Admit: 2013-12-14 | Discharge: 2013-12-15 | Disposition: A | Discharge status: Home / Self Care | Payer: Medicaid Other | Attending: Emergency Medicine | Admitting: Emergency Medicine

## 2013-12-14 DIAGNOSIS — B349 Viral infection, unspecified: Secondary | ICD-10-CM | POA: Diagnosis not present

## 2013-12-14 DIAGNOSIS — J069 Acute upper respiratory infection, unspecified: Secondary | ICD-10-CM | POA: Insufficient documentation

## 2013-12-14 DIAGNOSIS — Z3A24 24 weeks gestation of pregnancy: Secondary | ICD-10-CM | POA: Diagnosis not present

## 2013-12-14 DIAGNOSIS — F17210 Nicotine dependence, cigarettes, uncomplicated: Secondary | ICD-10-CM | POA: Insufficient documentation

## 2013-12-14 DIAGNOSIS — F419 Anxiety disorder, unspecified: Secondary | ICD-10-CM | POA: Diagnosis not present

## 2013-12-14 DIAGNOSIS — J40 Bronchitis, not specified as acute or chronic: Secondary | ICD-10-CM | POA: Diagnosis not present

## 2013-12-14 DIAGNOSIS — R062 Wheezing: Secondary | ICD-10-CM

## 2013-12-14 DIAGNOSIS — O99332 Smoking (tobacco) complicating pregnancy, second trimester: Secondary | ICD-10-CM | POA: Diagnosis not present

## 2013-12-14 DIAGNOSIS — Z79899 Other long term (current) drug therapy: Secondary | ICD-10-CM | POA: Insufficient documentation

## 2013-12-14 DIAGNOSIS — O99512 Diseases of the respiratory system complicating pregnancy, second trimester: Secondary | ICD-10-CM | POA: Insufficient documentation

## 2013-12-14 DIAGNOSIS — O99342 Other mental disorders complicating pregnancy, second trimester: Secondary | ICD-10-CM | POA: Insufficient documentation

## 2013-12-14 DIAGNOSIS — O10912 Unspecified pre-existing hypertension complicating pregnancy, second trimester: Secondary | ICD-10-CM | POA: Insufficient documentation

## 2013-12-14 MED ORDER — IPRATROPIUM-ALBUTEROL 0.5-2.5 (3) MG/3ML IN SOLN
3.0000 mL | Freq: Once | RESPIRATORY_TRACT | Status: AC
  Administered 2013-12-14: 3 mL via RESPIRATORY_TRACT
  Filled 2013-12-14: qty 3

## 2013-12-14 NOTE — ED Notes (Signed)
Pt c/o cough and congestion for two day. Pt reports coughing up light green mucus, temperature at home 99.5. Pt is six months pregnant. No complications during pregnancy. G3P2. Pt c/o n/v/d. Pt reports having white stools. Pt denies vaginal bleeding or abdominal pain.

## 2013-12-14 NOTE — ED Provider Notes (Signed)
CSN: [id number]     Arrival date & time 12/14/13  2232 History   First MD Initiated Contact with Patient 12/14/13 2259     Chief Complaint  Patient presents with  . Cough  . Nasal Congestion     (Consider location/radiation/quality/duration/timing/severity/associated sxs/prior Treatment) HPI Comments: Pt is 6 months pregnant. Became sick 2 days ago. Has URI like sx, with pleuritic chest pain - midsternal, with wheezing, active smoking and + sick contacts. Did take a flu shot. Pt has no hx of dvt, pe and denies any leg swelling, or pain.  Patient is a 23 y.o. female presenting with cough. The history is provided by the patient.  Cough Cough characteristics:  Productive Sputum characteristics:  Green Severity:  Moderate Onset quality:  Sudden Duration:  1 day Timing:  Intermittent Chronicity:  New Smoker: yes   Context: sick contacts and upper respiratory infection   Context: not fumes   Relieved by:  Decongestant and cough suppressants Associated symptoms: chest pain, headaches, myalgias, rhinorrhea, shortness of breath, sinus congestion, sore throat and wheezing   Associated symptoms: no ear pain and no fever     Past Medical History  Diagnosis Date  . Hypertension   . ADD (attention deficit disorder)   . Anxiety    Past Surgical History  Procedure Laterality Date  . Mouth surgery     Family History  Problem Relation Age of Onset  . Alcohol abuse Mother   . Depression Mother   . Alcohol abuse Father   . Diabetes Father    History  Substance Use Topics  . Smoking status: Current Every Day Smoker -- 0.50 packs/day    Types: Cigarettes  . Smokeless tobacco: Never Used  . Alcohol Use: Yes     Comment: Patient endorses drinking occassionally. Amount unspecified    OB History    Gravida Para Term Preterm AB TAB SAB Ectopic Multiple Living   1              Review of Systems  Constitutional: Positive for activity change. Negative for fever.  HENT: Positive for  rhinorrhea and sore throat. Negative for ear pain.   Respiratory: Positive for cough, shortness of breath and wheezing.   Cardiovascular: Positive for chest pain.  Gastrointestinal: Negative for nausea, vomiting and abdominal pain.  Genitourinary: Negative for dysuria, vaginal bleeding, vaginal discharge and pelvic pain.  Musculoskeletal: Positive for myalgias. Negative for neck pain.  Allergic/Immunologic: Negative for immunocompromised state.  Neurological: Positive for headaches.  All other systems reviewed and are negative.     Allergies  Vicodin  Home Medications   Prior to Admission medications   Medication Sig Start Date End Date Taking? Authorizing Provider  hydrOXYzine (VISTARIL) 25 MG capsule Take 25 mg by mouth 2 (two) times daily.   Yes Historical Provider, MD  Prenatal Vit-Fe Fumarate-FA (PRENATAL MULTIVITAMIN) TABS tablet Take 1 tablet by mouth daily at 12 noon.   Yes Historical Provider, MD  buPROPion (WELLBUTRIN XL) 150 MG 24 hr tablet Take 1 tablet (150 mg total) by mouth daily. 02/19/13   Beau FannyJohn C Withrow, FNP  clonazePAM (KLONOPIN) 0.5 MG tablet Take 1 tablet (0.5 mg total) by mouth 2 (two) times daily as needed for anxiety. 02/19/13   Beau FannyJohn C Withrow, FNP  neomycin-bacitracin-polymyxin (NEOSPORIN) OINT Apply 1 application topically 2 (two) times daily. 02/19/13   Beau FannyJohn C Withrow, FNP  predniSONE (DELTASONE) 50 MG tablet Take 1 tablet (50 mg total) by mouth daily. 12/15/13   Sherie Dobrowolski,  MD  traZODone (DESYREL) 50 MG tablet Take 1 tablet (50 mg total) by mouth at bedtime as needed for sleep. 02/19/13   Everardo AllJohn C Withrow, FNP   BP 101/51 mmHg  Pulse 92  Temp(Src) 98 F (36.7 C) (Oral)  Resp 27  Ht 5\' 6"  (1.676 m)  Wt 147 lb (66.679 kg)  BMI 23.74 kg/m2  SpO2 91%  LMP 02/14/2013 Physical Exam  Constitutional: She is oriented to person, place, and time. She appears well-developed and well-nourished.  HENT:  Head: Normocephalic and atraumatic.  Right Ear: External ear  normal.  Left Ear: External ear normal.  Mouth/Throat: Oropharyngeal exudate present.  Right sided exudate  Eyes: EOM are normal. Pupils are equal, round, and reactive to light.  Neck: Neck supple.  Cardiovascular: Normal rate, regular rhythm and normal heart sounds.   No murmur heard. Pulmonary/Chest: Effort normal. No respiratory distress. She has wheezes. She exhibits no tenderness.  Mild exp wheezing, left sided, with some rhonchi, diffuse  Abdominal: Soft. She exhibits no distension. There is no tenderness. There is no rebound and no guarding.  Musculoskeletal: She exhibits no edema or tenderness.  Neurological: She is alert and oriented to person, place, and time.  Skin: Skin is warm and dry.  Nursing note and vitals reviewed.   ED Course  Procedures (including critical care time) Labs Review Labs Reviewed  RAPID STREP SCREEN  CULTURE, GROUP A STREP  INFLUENZA PANEL BY PCR (TYPE A & B, H1N1)    Imaging Review Dg Chest 2 View  12/15/2013   CLINICAL DATA:  Wheezing, cough, shortness of breath. Patient is pregnant and was shielded.  EXAM: CHEST  2 VIEW  COMPARISON:  12/01/2012  FINDINGS: The heart size and mediastinal contours are within normal limits. Both lungs are clear. The visualized skeletal structures are unremarkable. Nodular opacities over the lung bases consistent with prominent nipple shadows.  IMPRESSION: No active cardiopulmonary disease.   Electronically Signed   By: Burman NievesWilliam  Stevens M.D.   On: 12/15/2013 00:53     EKG Interpretation None      The patient was counseled on the dangers of tobacco use, and was advised to quit.  Reviewed strategies to maximize success, including removing cigarettes and smoking materials from environment, stress management and substitution of other forms of reinforcement.   MDM   Final diagnoses:  Upper respiratory infection  Viral syndrome  Wheezing  Bronchitis    Pt comes in with URI like sx. She is pregnant. She was  wheezing, and had improved during my exam. She is smoker, and her cxr is clear. No fevers. Based on hx the pretest probability of this being a bronchitis from URI is extremely high compared to PE. Dimer is not indicated. Pt has no OB related complains, FHT in the 150s.  Will treat as bronchitis. Flu swab was ordered. Symptoms onset was 2 days ago - so ordered only because pt is pregnant.   Derwood KaplanAnkit Hend Mccarrell, MD 12/15/13 [phone]

## 2013-12-15 ENCOUNTER — Emergency Department (HOSPITAL_COMMUNITY): Payer: Medicaid Other

## 2013-12-15 LAB — INFLUENZA PANEL BY PCR (TYPE A & B)
H1N1FLUPCR: NOT DETECTED
INFLAPCR: NEGATIVE
Influenza B By PCR: NEGATIVE

## 2013-12-15 LAB — RAPID STREP SCREEN (MED CTR MEBANE ONLY): STREPTOCOCCUS, GROUP A SCREEN (DIRECT): NEGATIVE

## 2013-12-15 MED ORDER — SALINE SPRAY 0.65 % NA SOLN
1.0000 | Freq: Once | NASAL | Status: AC
  Administered 2013-12-15: 1 via NASAL
  Filled 2013-12-15: qty 44

## 2013-12-15 MED ORDER — PREDNISONE 50 MG PO TABS: 50.0000 mg | ORAL_TABLET | Freq: Every day | ORAL | Status: DC

## 2013-12-15 MED ORDER — PREDNISONE 20 MG PO TABS
60.0000 mg | ORAL_TABLET | Freq: Once | ORAL | Status: AC
  Administered 2013-12-15: 60 mg via ORAL
  Filled 2013-12-15: qty 3

## 2013-12-15 NOTE — Discharge Instructions (Signed)
We saw you in the ER for the wheezing, and congestion, and cough. We think what you have is a viral syndrome and bronchitis - the treatment for which is symptomatic relief only, and your body will fight the infection off in a few days. We are prescribing you some meds for bronchitis. Since you are pregnant, the guidelines indicate staying away from any cough, congestion meds - so a simple saline spray was given to you. See your primary care doctor in 1 week if the symptoms dont improve. Come to the ER if symptoms are getting worse.   Cool Mist Vaporizers Vaporizers may help relieve the symptoms of a cough and cold. They add moisture to the air, which helps mucus to become thinner and less sticky. This makes it easier to breathe and cough up secretions. Cool mist vaporizers do not cause serious burns like hot mist vaporizers, which may also be called steamers or humidifiers. Vaporizers have not been proven to help with colds. You should not use a vaporizer if you are allergic to mold. HOME CARE INSTRUCTIONS  Follow the package instructions for the vaporizer.  Do not use anything other than distilled water in the vaporizer.  Do not run the vaporizer all of the time. This can cause mold or bacteria to grow in the vaporizer.  Clean the vaporizer after each time it is used.  Clean and dry the vaporizer well before storing it.  Stop using the vaporizer if worsening respiratory symptoms develop. Document Released: 10/28/2003 Document Revised: 02/04/2013 Document Reviewed: 06/19/2012 [facility] Patient Information 2015 [place], [place]. This information is not intended to replace advice given to you by your health care provider. Make sure you discuss any questions you have with your health care provider. Acute Bronchitis Bronchitis is inflammation of the airways that extend from the windpipe into the lungs (bronchi). The inflammation often causes mucus to develop. This leads to a cough, which is the  most common symptom of bronchitis.  In acute bronchitis, the condition usually develops suddenly and goes away over time, usually in a couple weeks. Smoking, allergies, and asthma can make bronchitis worse. Repeated episodes of bronchitis may cause further lung problems.  CAUSES Acute bronchitis is most often caused by the same virus that causes a cold. The virus can spread from person to person (contagious) through coughing, sneezing, and touching contaminated objects. SIGNS AND SYMPTOMS   Cough.   Fever.   Coughing up mucus.   Body aches.   Chest congestion.   Chills.   Shortness of breath.   Sore throat.  DIAGNOSIS  Acute bronchitis is usually diagnosed through a physical exam. Your health care provider will also ask you questions about your medical history. Tests, such as chest X-rays, are sometimes done to rule out other conditions.  TREATMENT  Acute bronchitis usually goes away in a couple weeks. Oftentimes, no medical treatment is necessary. Medicines are sometimes given for relief of fever or cough. Antibiotic medicines are usually not needed but may be prescribed in certain situations. In some cases, an inhaler may be recommended to help reduce shortness of breath and control the cough. A cool mist vaporizer may also be used to help thin bronchial secretions and make it easier to clear the chest.  HOME CARE INSTRUCTIONS  Get plenty of rest.   Drink enough fluids to keep your urine clear or pale yellow (unless you have a medical condition that requires fluid restriction). Increasing fluids may help thin your respiratory secretions (sputum) and reduce  chest congestion, and it will prevent dehydration.   Take medicines only as directed by your health care provider.  If you were prescribed an antibiotic medicine, finish it all even if you start to feel better.  Avoid smoking and secondhand smoke. Exposure to cigarette smoke or irritating chemicals will make  bronchitis worse. If you are a smoker, consider using nicotine gum or skin patches to help control withdrawal symptoms. Quitting smoking will help your lungs heal faster.   Reduce the chances of another bout of acute bronchitis by washing your hands frequently, avoiding people with cold symptoms, and trying not to touch your hands to your mouth, nose, or eyes.   Keep all follow-up visits as directed by your health care provider.  SEEK MEDICAL CARE IF: Your symptoms do not improve after 1 week of treatment.  SEEK IMMEDIATE MEDICAL CARE IF:  You develop an increased fever or chills.   You have chest pain.   You have severe shortness of breath.  You have bloody sputum.   You develop dehydration.  You faint or repeatedly feel like you are going to pass out.  You develop repeated vomiting.  You develop a severe headache. MAKE SURE YOU:   Understand these instructions.  Will watch your condition.  Will get help right away if you are not doing well or get worse. Document Released: 03/09/2004 Document Revised: 06/16/2013 Document Reviewed: 07/23/2012 [facility] Patient Information 2015 [place], [place]. This information is not intended to replace advice given to you by your health care provider. Make sure you discuss any questions you have with your health care provider.

## 2013-12-15 NOTE — ED Notes (Signed)
Patient ambulated with pulse oximeter, readings remained >95% throughout.

## 2013-12-16 LAB — CULTURE, GROUP A STREP

## 2014-01-01 ENCOUNTER — Ambulatory Visit (INDEPENDENT_AMBULATORY_CARE_PROVIDER_SITE_OTHER): Payer: Medicaid Other | Admitting: Neurology

## 2014-01-01 ENCOUNTER — Encounter: Payer: Self-pay | Admitting: Neurology

## 2014-01-01 VITALS — BP 122/65 | HR 100 | Ht 66.0 in | Wt 155.0 lb

## 2014-01-01 DIAGNOSIS — F329 Major depressive disorder, single episode, unspecified: Secondary | ICD-10-CM

## 2014-01-01 DIAGNOSIS — F4310 Post-traumatic stress disorder, unspecified: Secondary | ICD-10-CM

## 2014-01-01 DIAGNOSIS — F319 Bipolar disorder, unspecified: Secondary | ICD-10-CM

## 2014-01-01 DIAGNOSIS — F32A Depression, unspecified: Secondary | ICD-10-CM

## 2014-01-01 MED ORDER — RIBOFLAVIN 100 MG PO CAPS: 100.0000 mg | ORAL_CAPSULE | Freq: Two times a day (BID) | ORAL | Status: DC

## 2014-01-01 MED ORDER — BUTALBITAL-APAP-CAFFEINE 50-300-40 MG PO CAPS: ORAL_CAPSULE | ORAL | Status: DC

## 2014-01-01 MED ORDER — MAGNESIUM OXIDE -MG SUPPLEMENT 400 (240 MG) MG PO TABS: 400.0000 mg | ORAL_TABLET | Freq: Two times a day (BID) | ORAL | Status: DC

## 2014-01-01 NOTE — Progress Notes (Signed)
PATIENT: [name] DOB: 05/29/1990  HISTORICAL  Jillian BuffyHeather L Decuir is [age] years old right-handed female, referred by her obstetrician Dr. Sallye OberKulwa for evaluation of frequent headaches  She is currently [redacted] weeks pregnant, this is her third pregnancy, she has children of 587, and 24 years old, she had a past medical history of bipolar disorder, PTSD, complains of excessive stress in her life recently.  She reported a history of similar headaches since middle school, but only occasionally happens in the past, increased frequency since beginning of 2015, getting and, over the past 6 months, she almost has daily bilateral frontal, retro-orbital area pressure headaches, sometimes severe, pounding headaches, with associated light, noise sensitivity, nauseous, she just want to lie down in dark quiet room, she also complains of occipital area up and nuchal area pressure pain. During intense headaches, she has blurry visions, her headache last 1 day.   She is now having headaches  every other day to every day, stay in bed most of the time, few stress, Tylenol did not help her headaches, she never tried preventive medications in the past.   She denies excessive edema, trigger of her headaches are bright light, strong smell, stress, sleep to perforation.  REVIEW OF SYSTEMS: Full 14 system review of systems performed and notable only for fatigue, fever, chill, blurry vision, shortness of breath, feeling hot, increased thirst, cramps, headaches, weakness, dizziness, passing out, restless leg, depression, anxiety, disinterested in activities, racing thoughts   ALLERGIES: Allergies  Allergen Reactions  . Vicodin [Hydrocodone-Acetaminophen] Nausea And Vomiting    Patient states that it makes her nauseated and causes vomiting.      HOME MEDICATIONS: Current Outpatient Prescriptions on File Prior to Visit  Medication Sig Dispense Refill  . hydrOXYzine (VISTARIL) 25 MG capsule Take 25 mg by mouth 2  (two) times daily.    . Prenatal Vit-Fe Fumarate-FA (PRENATAL MULTIVITAMIN) TABS tablet Take 1 tablet by mouth daily at 12 noon.     No current facility-administered medications on file prior to visit.    PAST MEDICAL HISTORY: Past Medical History  Diagnosis Date  . Hypertension   . ADD (attention deficit disorder)   . Anxiety   . HA (headache)   . PTSD (post-traumatic stress disorder)   . Depression   . Bipolar 1 disorder     PAST SURGICAL HISTORY: Past Surgical History  Procedure Laterality Date  . Mouth surgery      FAMILY HISTORY: Family History  Problem Relation Age of Onset  . Alcohol abuse Mother   . Depression Mother   . Alcohol abuse Father   . Diabetes Father     SOCIAL HISTORY:  History   Social History  . Marital Status: Single    Spouse Name: N/A    Number of Children: 2  . Years of Education: 10 th   Occupational History  . Not on file.   Social History Main Topics  . Smoking status: Current Every Day Smoker -- 0.50 packs/day    Types: Cigarettes  . Smokeless tobacco: Never Used  . Alcohol Use: 0.0 oz/week    0 Not specified per week     Comment: Patient endorses drinking occassionally. Amount unspecified   . Drug Use: No     Comment: Patient denies   . Sexual Activity: Yes    Birth Control/ Protection: Condom   Other Topics Concern  . Not on file   Social History Narrative   Patient lives at home with friends  and she is single.   Unemployed.   Education 10 th grade.   Right handed.   Caffeine Mountain dew and pepsi five or more cups daily.     PHYSICAL EXAM   Filed Vitals:   01/01/14 0825  BP: 122/65  Pulse: 100  Height: 5\' 6"  (1.676 m)  Weight: 155 lb (70.308 kg)    Not recorded      Body mass index is 25.03 kg/(m^2).   Generalized: In no acute distress  Neck: Supple, no carotid bruits   Cardiac: Regular rate rhythm  Pulmonary: Clear to auscultation bilaterally  Musculoskeletal: No deformity  Neurological  examination  Mentation: Alert oriented to time, place, history taking, and causual conversation  Cranial nerve II-XII: Pupils were equal round reactive to light. Extraocular movements were full.  Visual field were full on confrontational test. Bilateral fundi were sharp.  Facial sensation and strength were normal. Hearing was intact to finger rubbing bilaterally. Uvula tongue midline.  Head turning and shoulder shrug and were normal and symmetric.Tongue protrusion into cheek strength was normal.  Motor: Normal tone, bulk and strength.  Sensory: Intact to fine touch, pinprick, preserved vibratory sensation, and proprioception at toes.  Coordination: Normal finger to nose, heel-to-shin bilaterally there was no truncal ataxia  Gait: Rising up from seated position without assistance, normal stance, without trunk ataxia, moderate stride, good arm swing, smooth turning, able to perform tiptoe, and heel walking without difficulty.   Romberg signs: Negative  Deep tendon reflexes: Brachioradialis 2/2, biceps 2/2, triceps 2/2, patellar 2/2, Achilles 2/2, plantar responses were flexor bilaterally.   DIAGNOSTIC DATA (LABS, IMAGING, TESTING) - I reviewed patient records, labs, notes, testing and imaging myself where available.  Lab Results  Component Value Date   WBC 8.9 02/15/2013   HGB 14.6 02/15/2013   HCT 41.8 02/15/2013   MCV 93.7 02/15/2013   PLT 250 02/15/2013      Component Value Date/Time   NA 142 02/15/2013 0200   K 3.7 02/15/2013 0200   CL 103 02/15/2013 0200   CO2 24 02/15/2013 0200   GLUCOSE 106* 02/15/2013 0200   BUN 9 02/15/2013 0200   CREATININE 0.76 02/15/2013 0200   CALCIUM 9.2 02/15/2013 0200   PROT 7.4 02/15/2013 0200   ALBUMIN 4.4 02/15/2013 0200   AST 19 02/15/2013 0200   ALT 15 02/15/2013 0200   ALKPHOS 59 02/15/2013 0200   BILITOT 0.3 02/15/2013 0200   GFRNONAA >90 02/15/2013 0200   GFRAA >90 02/15/2013 0200   ASSESSMENT AND PLAN  Jillian BuffyHeather L Lawrance is a 23  y.o. female complains of  frequent headaches, with migraine features, normal neurological examination, currently [redacted] weeks pregnant, also with comorbidity of bipolar disorder, PTSD, excessive family stress.    1, avoid trigger. 2. Magnesium oxide 400 mg twice a day, riboflavin 100 mg twice a day 3, Fioricet as needed, limit use to less than 3 tablets each week 15 tablets was written for 1 month 4, return to clinic in 2 month,     Levert FeinsteinYijun Shawanda Sievert, M.D. Ph.D.  [facility] [address], [address] [place], [place] [address] [phone]

## 2014-01-09 ENCOUNTER — Emergency Department: Payer: Self-pay | Admitting: Emergency Medicine

## 2014-02-14 ENCOUNTER — Observation Stay: Payer: Self-pay | Admitting: Obstetrics & Gynecology

## 2014-02-14 LAB — URINALYSIS, COMPLETE
BACTERIA: NONE SEEN
BLOOD: NEGATIVE
Bilirubin,UR: NEGATIVE
GLUCOSE, UR: NEGATIVE mg/dL (ref 0–75)
Ketone: NEGATIVE
Leukocyte Esterase: NEGATIVE
NITRITE: NEGATIVE
PROTEIN: NEGATIVE
Ph: 7 (ref 4.5–8.0)
RBC,UR: NONE SEEN /HPF (ref 0–5)
Specific Gravity: 1.008 (ref 1.003–1.030)
Squamous Epithelial: 1
WBC UR: 1 /HPF (ref 0–5)

## 2014-03-09 ENCOUNTER — Ambulatory Visit: Payer: Medicaid Other | Admitting: Neurology

## 2014-04-14 ENCOUNTER — Observation Stay: Payer: Self-pay

## 2014-04-16 ENCOUNTER — Inpatient Hospital Stay (HOSPITAL_COMMUNITY)
Admission: AD | Admit: 2014-04-16 | Payer: No Typology Code available for payment source | Source: Ambulatory Visit | Admitting: Obstetrics and Gynecology

## 2014-04-17 ENCOUNTER — Inpatient Hospital Stay: Payer: Self-pay | Admitting: Obstetrics and Gynecology

## 2014-04-26 ENCOUNTER — Emergency Department: Payer: Self-pay | Admitting: Emergency Medicine

## 2014-05-28 ENCOUNTER — Emergency Department
Admit: 2014-05-28 | Disposition: A | Discharge status: Home / Self Care | Payer: Self-pay | Admitting: Emergency Medicine

## 2014-05-28 LAB — CBC
HCT: 43.3 % (ref 35.0–47.0)
HGB: 14.4 g/dL (ref 12.0–16.0)
MCH: 30.8 pg (ref 26.0–34.0)
MCHC: 33.4 g/dL (ref 32.0–36.0)
MCV: 92 fL (ref 80–100)
PLATELETS: 227 10*3/uL (ref 150–440)
RBC: 4.68 10*6/uL (ref 3.80–5.20)
RDW: 12.5 % (ref 11.5–14.5)
WBC: 12.4 10*3/uL — ABNORMAL HIGH (ref 3.6–11.0)

## 2014-05-29 LAB — URINALYSIS, COMPLETE
BILIRUBIN, UR: NEGATIVE
Bacteria: NONE SEEN
Glucose,UR: NEGATIVE mg/dL (ref 0–75)
KETONE: NEGATIVE
Leukocyte Esterase: NEGATIVE
Nitrite: NEGATIVE
PROTEIN: NEGATIVE
Ph: 6 (ref 4.5–8.0)
Specific Gravity: 1.008 (ref 1.003–1.030)

## 2014-05-29 LAB — GC/CHLAMYDIA PROBE AMP

## 2014-05-29 LAB — HCG, QUANTITATIVE, PREGNANCY

## 2014-06-14 NOTE — Consult Note (Signed)
OBGYN Consult Noteof Consult: 4/15/2016Dr. Cornelia Copaharlie Marlia Schewe Jr MD, [facility]ervice: [facility]. Manson PasseyBrown, [facility] ED[facility]N: [facility] Complaint: VB and dizziness 24 y/o [id number] with the above CC. PMHx significant for 3/4 SVD/intact perineum, anxiety/depression, PTSD, ADD, sciatica.  Patient had normal SVD and no issues or problems with spontaneous delivery of the placenta; she was discharged to home on PPD#2 with plans for nexplanon and no bridge. She continues to breast and bottle feed, denies any sexual intercourse since delivery.  Starting 5 days ago, patient has had VB of old blood and abdominal cramping. Patient states she's using b/w 10-15 pads (non saturated) qday. She states she called the office and a few days ago and was told to come to the ER but wanted to wait to see if it resolved. [facility] for right armpit "abscess" and was put on bid doxy x 10 days, which she hasn't started; she states they told her to come to the ER for evaluation, as well. Patient had u/s that had findings concerning for endometritis so OBGYN was consulted. I advised them to get a U/A, beta hcg, do a pelvic exam and call back with results. Old blood in vault with two large procto swabs worth and no active bleeding.  fevers, chills, nausea, vomiting, difficulty with PO, chest pain, dysuria. as abovenoneas above. SVD x 2non contributorydoxy (hasn't started) and gabapentin for sciaticaas above  90s-110s/40s-70s  70s  98/RAbottle feeding with infant on bellyno MRGsCVATsoft, +BS, ND, NTTP. pt states mildly ttp to palpation near suprapubic area. no peritoneal s/[place]/c/e 12.4>14/43<227. beta hcg negative, u/a negative except for blood Uterus 9 x 5 x 6 cm. No fibroids or other mass visualized.  There is focal thickening of the endometrium at themeasuring up to 16 mm with internal color Doppler flow andspectral reflectors that cause occasional shadowing.is concerning for retained products of  conception withgas- possible endometritis. ovary 4.1 x 2.7 x 2.8 cm. Normal appearance/no adnexal mass. ovary 4.2 x 1.7 x 2.4 cm. Normal appearance/no adnexal mass. findings free fluid. vascularized endometrial thickening compatible with retainedof conception. Spectral reflectors in this region couldgas and endometritis.  On my review of the u/s, agree with above except would add uterus is mid plane. agree with ES but it starts off thin and goes to about 15mm at fundus and it appears uniform and slightly hypergenic. Gas areas show up as blood vessels with doppler flow pt stable with likely start of mensestold the ER and the patient that her bleeding is likely due to start of menses since she is about six weeks out, is bottle feeding supplementing and isn't on anything to stop her from having a period. In my judgment, her exam isn't impressive and she has no e/o endometritis. pt advised to continue current plan, start her doxy for her armpit abscess, which is likely caused her slight white count (pt afebrile in ER and never endorsed these s/s) and will start norethindrone taper of 10mg  q4h x 4 days, q6 x 4d, q8 x 3d and q12 x 2 days. pt already has PPV appt with Dr. Jean RosenthalJackson on 4/20 and she was told to keep that appt and call us with any questions or concerns.   Electronic Signatures: [place] BingPickens, Olayinka Gathers (MD) (Signed on 15-Apr-16 03:59)  Authored   Last Updated: 15-Apr-16 04:10 by [place] BingPickens, Anglea Gordner (MD)

## 2014-06-23 NOTE — H&P (Signed)
L&D Evaluation:  History Expanded:  HPI Ms. [name] is a 24yo [id number] at 8031w1d by LMP with EDC of 04/16/14 Pt of [facility] after transferring from [facility] [place] [facility] at 32 weeks presents to L&D after gross ROM at about 8am this morning of clearl fluid.  No bleeding, and positive fetal movement.  Contractions have become worse since ROM.    Pregnancy issues:  A+/Ab neg/RPR NR/HBsAg neg/HIV NR/GCCT neg/RI/Varicella not recorded/ Pap normal/2nd trim screen neg/1hr GTT 95   Gravida 4   Term 2   PreTerm 0   Abortion 1   Living 2   Blood Type (Maternal) A positive   Group B Strep Results Maternal (Result >5wks must be treated as unknown) negative  03/25/14   Maternal HIV Negative   Maternal Syphilis Ab Nonreactive   Rubella Results (Maternal) immune   EDC 16-Apr-2014   Presents with contractions   Patient's Medical History Migraines, ADD, PTSD, despression, measles, back pain taking flexeril   Patient's Surgical History none   Medications Pre Natal Vitamins  flexeril   Allergies NKDA   Social History tobacco  2-3  cigs per day   Family History Non-Contributory   ROS:  ROS All systems were reviewed.  HEENT, CNS, GI, GU, Respiratory, CV, Renal and Musculoskeletal systems were found to be normal., except as in HPI   Exam:  Vital Signs stable  T98.88F, BP 109/65, P83, RR18   Urine Protein not completed   General no apparent distress   Mental Status clear   Chest clear   Heart normal sinus rhythm   Abdomen gravid, non-tender   Estimated Fetal Weight Average for gestational age, 8 pounds   Fetal Position ceph   Back no CVAT   Edema no edema   Pelvic 2cm per RN   Mebranes Ruptured   Description clear   FHT normal rate with no decels, 130. + accels,  no decels, mod var   Ucx irregular, 2-3 q 10 min   Skin dry, no rashes, multiple tattoos   Lymph no lymphadenopathy   Impression:  Impression 1) Intrauterine pregnancy at 4231w1d gestational age, 2)  SROM   Plan:  Plan EFM/NST, monitor contractions and for cervical change   Comments 1) Labor: expectant management  2) Fetus - category I tracing  3) PNL A positive / ABSC negative / RI / VZ - no titer / HIV neg / RPR NR / HBsAg neg / 2nd trimester screen negative / 1-hr OGTT 95 / GBS negative / total weight gain this pregnancy = 30 pounds      4) TDAP given 01/19/14 [place] Sizer/flu  given 11/28/14  5) Disposition - home postpartum   Electronic Signatures: Conard NovakJackson, Shanae Luo D (MD)  (Signed 04-Mar-16 11:03)  Authored: L&D Evaluation   Last Updated: 04-Mar-16 11:03 by Conard NovakJackson, Tali Coster D (MD)

## 2014-06-23 NOTE — H&P (Signed)
L&D Evaluation:  History:  HPI Ms. [name] is a 24yo [id number] at 31.2 by LMP with EDC of 04/16/14 who [facility] [place].  She was in the area and felt contractions and some pelvic pressure, as well as nausea and a clear discharge.  She presented for evaluation for these complaints.  No bleeding, LOF, and +FM.  Pregnancy issues: PMH: migraines, ADD, PTSD, depression, Hx of measles. NKDA back pain/spasms this pregnancy: taking flexeril A+/Ab neg/RPR NR/HBsAg neg/HIV NR/GCCT neg/RI/Varicella not recorded/ Pap normal/1st trimester screen neg/1hr GTT 95 Hx of 2 FT SVD   Presents with abdominal pain, contractions, nausea/vomiting   Patient's Medical History see HPI   Patient's Surgical History none   Medications Pre Natal Vitamins  flexeril   Allergies NKDA   Social History none   Family History Non-Contributory   ROS:  ROS All systems were reviewed.  HEENT, CNS, GI, GU, Respiratory, CV, Renal and Musculoskeletal systems were found to be normal., except as in HPI   Exam:  Vital Signs stable   Urine Protein negative dipstick   General no apparent distress   Mental Status clear   Chest clear   Heart normal sinus rhythm   Abdomen gravid, non-tender   Estimated Fetal Weight Average for gestational age   Back no CVAT   Edema no edema   Pelvic cervix long and multiparous, fingertip internal os dilation   Mebranes Intact, nitrazine negative, no pooling   FHT normal rate with no decels, 135 + accels no decels mod var   Fetal Heart Rate 135   Ucx absent   Skin dry, no lesions   Impression:  Impression other, nausea, resolved low back/pelvic pressure/contractions   Plan:  Comments 24yo [id number] @ 31.2 with concern for PTL.   No contractions on monitor, and as of my evaluation no more contractions from patient's standpoint.  She does have new-onset nausea, and while there is a GI bug going around, it could be a new finding in her pregnancy.   Phenergan 12.5mg  PO given prior to discharge and Rx ordered for q6h.   Long conversation had with patient that she may have had preterm contractions, but they have for now resolved, and while her cervix is long, fingertip dilation is uncommon at this gestational age.  She has an appointment on the 6th and I asked her to please keep this appt and have them do a cervical check.  If there is any change, she would be a candidate for IM betamethasone.  [facility] regarding her visit and condition.  Patient stable for discharge at this point.  Please return [facility] or in [place] for evaluation if condition changes.   Follow Up Appointment already scheduled   Electronic Signatures: Nashaly Dorantes, Elenora Fenderhelsea C (MD)  (Signed 02-Jan-16 18:04)  Authored: L&D Evaluation   Last Updated: 02-Jan-16 18:04 by Azzan Butler, Elenora Fenderhelsea C (MD)

## 2014-06-23 NOTE — H&P (Signed)
L&D Evaluation:  History:  HPI Ms. [name] is a 23yo [id number] at 39.5 by LMP with EDC of 04/16/14 Pt of [facility] after transferring from [facility] [place] at 32 weeks presents to L&D via EMS for reports of contractions that started at 9am. She reports the contractions are every 4 minutes and last for about 2 minutes. No bleeding, LOF, and +FM.  Pregnancy issues: PMH: migraines, ADD, PTSD, depression, Hx of measles, obesity with a BMI of 30, back pain/spasms this pregnancy: taking flexeril A+/Ab neg/RPR NR/HBsAg neg/HIV NR/GCCT neg/RI/Varicella not recorded/ Pap normal/1st trimester screen neg/1hr GTT 95 Hx of 2 FT SVD   Presents with contractions   Patient's Medical History ADD, PTSD, despression, measles   Patient's Surgical History none   Medications Pre Natal Vitamins  flexeril   Allergies NKDA   Social History tobacco   Family History Non-Contributory   ROS:  ROS All systems were reviewed.  HEENT, CNS, GI, GU, Respiratory, CV, Renal and Musculoskeletal systems were found to be normal., except as in HPI   Exam:  Vital Signs stable   Urine Protein negative dipstick   General no apparent distress   Mental Status clear   Chest clear   Heart normal sinus rhythm   Abdomen gravid, non-tender   Estimated Fetal Weight Average for gestational age   Back no CVAT   Edema no edema   Pelvic 1.5.50/-2 posterior   Mebranes Intact   FHT normal rate with no decels, 135 + accels no decels mod var   Fetal Heart Rate 135   Ucx irregular   Skin dry, no lesions, no rashes   Lymph no lymphadenopathy   Impression:  Impression IUP at 39.5, R/O labor   Plan:  Plan EFM/NST, monitor contractions and for cervical change, ambulate around unit. Will reassess cervix- if no change- pt to be discharged, if cervical change consider admission.   Follow Up Appointment already scheduled   Electronic Signatures: Jannet MantisSubudhi, Nadelyn Enriques (CNM)  (Signed 01-Mar-16 11:16)  Authored:  L&D Evaluation   Last Updated: 01-Mar-16 11:16 by Jannet MantisSubudhi, Hoda Hon (CNM)

## 2015-02-14 NOTE — L&D Delivery Note (Signed)
Obstetrical Delivery Note   Date of Delivery:   11/20/2015 Primary OB:   [facility] Gestational Age/EDD: 4168w0d (Dated by 10 week 4 day ultrasound) Antepartum complications: Anxiety, hepatitis C antibody positive, HCV RNA negative  Delivered By:   Farrel ConnersGUTIERREZ, Deondre Marinaro, CNM  Delivery Type:   spontaneous vaginal delivery  Procedure Details:   Called by nurse that patient had spontaneously ruptured, and is now 10 cm, +2 station.  EFM remains category 1.  FHR 130's with occasional variable decelerations and spontaneous recovery to baseline.  SVD of viable female infant at 1159 over intact perineum.  Shoulders easily delivered with mom in lithotomy position.  Infant with spontaneous cry, dried on mom's stomach.  Cord clamped x2, then cut by FOB.  Apgars 9, 9.  Spontaneous delivery of placenta, intact with 3 vessel cord.  Uterus firm with massage below umbilicus, moderate rubra lochia, no clots noted.  Perineum, vagina, side walls and cervix inspected and intact.  EBL approximately 350 ml.  Mom and infant stable in LDRP.   Anesthesia:    epidural Intrapartum complications: None GBS:    Negative Laceration:    none Episiotomy:    none Placenta:    Via active 3rd stage. To pathology: no Estimated Blood Loss:  350  Baby:    Liveborn female, Apgars 9/9, weight pending   Tonye PearsonLauren Friedman SNM/ Farrel ConnersGUTIERREZ, Elvan Ebron, CNM

## 2015-03-01 ENCOUNTER — Emergency Department: Payer: Medicaid Other

## 2015-03-01 ENCOUNTER — Emergency Department
Admission: EM | Admit: 2015-03-01 | Discharge: 2015-03-01 | Disposition: A | Discharge status: Home / Self Care | Payer: Medicaid Other | Attending: Emergency Medicine | Admitting: Emergency Medicine

## 2015-03-01 ENCOUNTER — Encounter: Payer: Self-pay | Admitting: Emergency Medicine

## 2015-03-01 DIAGNOSIS — F17210 Nicotine dependence, cigarettes, uncomplicated: Secondary | ICD-10-CM | POA: Diagnosis not present

## 2015-03-01 DIAGNOSIS — J069 Acute upper respiratory infection, unspecified: Secondary | ICD-10-CM | POA: Diagnosis not present

## 2015-03-01 DIAGNOSIS — I10 Essential (primary) hypertension: Secondary | ICD-10-CM | POA: Diagnosis not present

## 2015-03-01 DIAGNOSIS — R05 Cough: Secondary | ICD-10-CM | POA: Diagnosis present

## 2015-03-01 MED ORDER — IPRATROPIUM-ALBUTEROL 0.5-2.5 (3) MG/3ML IN SOLN
RESPIRATORY_TRACT | Status: AC
  Administered 2015-03-01: 3 mL via RESPIRATORY_TRACT
  Filled 2015-03-01: qty 3

## 2015-03-01 MED ORDER — AZITHROMYCIN 250 MG PO TABS: ORAL_TABLET | ORAL | Status: DC

## 2015-03-01 MED ORDER — GUAIFENESIN-CODEINE 100-10 MG/5ML PO SOLN: 10.0000 mL | ORAL | Status: DC | PRN

## 2015-03-01 MED ORDER — ALBUTEROL SULFATE HFA 108 (90 BASE) MCG/ACT IN AERS: 2.0000 | INHALATION_SPRAY | Freq: Four times a day (QID) | RESPIRATORY_TRACT | Status: DC | PRN

## 2015-03-01 MED ORDER — IPRATROPIUM-ALBUTEROL 0.5-2.5 (3) MG/3ML IN SOLN
3.0000 mL | Freq: Once | RESPIRATORY_TRACT | Status: AC
  Administered 2015-03-01: 3 mL via RESPIRATORY_TRACT

## 2015-03-01 NOTE — ED Notes (Signed)
Pt to ER states cough and congestion x 2 weeks.  Pt with noted deep cough.  States cough productive of green sputum

## 2015-03-01 NOTE — Discharge Instructions (Signed)
Upper Respiratory Infection, Adult Most upper respiratory infections (URIs) are a viral infection of the air passages leading to the lungs. A URI affects the nose, throat, and upper air passages. The most common type of URI is nasopharyngitis and is typically referred to as "the common cold." URIs run their course and usually go away on their own. Most of the time, a URI does not require medical attention, but sometimes a bacterial infection in the upper airways can follow a viral infection. This is called a secondary infection. Sinus and middle ear infections are common types of secondary upper respiratory infections. Bacterial pneumonia can also complicate a URI. A URI can worsen asthma and chronic obstructive pulmonary disease (COPD). Sometimes, these complications can require emergency medical care and may be life threatening.  CAUSES Almost all URIs are caused by viruses. A virus is a type of germ and can spread from one person to another.  RISKS FACTORS You may be at risk for a URI if:   You smoke.   You have chronic heart or lung disease.  You have a weakened defense (immune) system.   You are very young or very old.   You have nasal allergies or asthma.  You work in crowded or poorly ventilated areas.  You work in health care facilities or schools. SIGNS AND SYMPTOMS  Symptoms typically develop 2-3 days after you come in contact with a cold virus. Most viral URIs last 7-10 days. However, viral URIs from the influenza virus (flu virus) can last 14-18 days and are typically more severe. Symptoms may include:   Runny or stuffy (congested) nose.   Sneezing.   Cough.   Sore throat.   Headache.   Fatigue.   Fever.   Loss of appetite.   Pain in your forehead, behind your eyes, and over your cheekbones (sinus pain).  Muscle aches.  DIAGNOSIS  Your health care provider may diagnose a URI by:  Physical exam.  Tests to check that your symptoms are not due to  another condition such as:  Strep throat.  Sinusitis.  Pneumonia.  Asthma. TREATMENT  A URI goes away on its own with time. It cannot be cured with medicines, but medicines may be prescribed or recommended to relieve symptoms. Medicines may help:  Reduce your fever.  Reduce your cough.  Relieve nasal congestion. HOME CARE INSTRUCTIONS   Take medicines only as directed by your health care provider.   Gargle warm saltwater or take cough drops to comfort your throat as directed by your health care provider.  Use a warm mist humidifier or inhale steam from a shower to increase air moisture. This may make it easier to breathe.  Drink enough fluid to keep your urine clear or pale yellow.   Eat soups and other clear broths and maintain good nutrition.   Rest as needed.   Return to work when your temperature has returned to normal or as your health care provider advises. You may need to stay home longer to avoid infecting others. You can also use a face mask and careful hand washing to prevent spread of the virus.  Increase the usage of your inhaler if you have asthma.   Do not use any tobacco products, including cigarettes, chewing tobacco, or electronic cigarettes. If you need help quitting, ask your health care provider. PREVENTION  The best way to protect yourself from getting a cold is to practice good hygiene.   Avoid oral or hand contact with people with cold   symptoms.   Wash your hands often if contact occurs.  There is no clear evidence that vitamin C, vitamin E, echinacea, or exercise reduces the chance of developing a cold. However, it is always recommended to get plenty of rest, exercise, and practice good nutrition.  SEEK MEDICAL CARE IF:   You are getting worse rather than better.   Your symptoms are not controlled by medicine.   You have chills.  You have worsening shortness of breath.  You have brown or red mucus.  You have yellow or brown nasal  discharge.  You have pain in your face, especially when you bend forward.  You have a fever.  You have swollen neck glands.  You have pain while swallowing.  You have white areas in the back of your throat. SEEK IMMEDIATE MEDICAL CARE IF:   You have severe or persistent:  Headache.  Ear pain.  Sinus pain.  Chest pain.  You have chronic lung disease and any of the following:  Wheezing.  Prolonged cough.  Coughing up blood.  A change in your usual mucus.  You have a stiff neck.  You have changes in your:  Vision.  Hearing.  Thinking.  Mood. MAKE SURE YOU:   Understand these instructions.  Will watch your condition.  Will get help right away if you are not doing well or get worse.   This information is not intended to replace advice given to you by your health care provider. Make sure you discuss any questions you have with your health care provider.   Document Released: 07/26/2000 Document Revised: 06/16/2014 Document Reviewed: 05/07/2013 Elsevier Interactive Patient Education 2016 [employer] Inc[employer]  

## 2015-03-01 NOTE — ED Provider Notes (Signed)
[facility] Emergency Department Provider Note  ____________________________________________  Time seen: Approximately 11:07 AM  I have reviewed the triage vital signs and the nursing notes.   HISTORY  Chief Complaint Cough    HPI Flossie BuffyHeather L Wilkinson is a 25 y.o. female presents for evaluation of cough congestion 2 weeks. Patient states that her cough is productive green sputum..Are here with same.   Past Medical History  Diagnosis Date  . Hypertension   . ADD (attention deficit disorder)   . Anxiety   . HA (headache)   . PTSD (post-traumatic stress disorder)   . Depression   . Bipolar 1 disorder [facility](HCC)     Patient Active Problem List   Diagnosis Date Noted  . PTSD (post-traumatic stress disorder)   . Depression   . Bipolar 1 disorder (HCC)   . Major depression (HCC) 02/16/2013  . Alcohol abuse 02/16/2013  . Anxiety state, unspecified 02/16/2013    Past Surgical History  Procedure Laterality Date  . Mouth surgery      Current Outpatient Rx  Name  Route  Sig  Dispense  Refill  . albuterol (PROVENTIL HFA;VENTOLIN HFA) 108 (90 Base) MCG/ACT inhaler   Inhalation   Inhale 2 puffs into the lungs every 6 (six) hours as needed for wheezing or shortness of breath.   1 Inhaler   2   . azithromycin (ZITHROMAX Z-PAK) 250 MG tablet      Take 2 tablets (500 mg) on  Day 1,  followed by 1 tablet (250 mg) once daily on Days 2 through 5.   6 each   0   . guaiFENesin-codeine 100-10 MG/5ML syrup   Oral   Take 10 mLs by mouth every 4 (four) hours as needed for cough.   180 mL   0     Allergies Vicodin  Family History  Problem Relation Age of Onset  . Alcohol abuse Mother   . Depression Mother   . Alcohol abuse Father   . Diabetes Father     Social History Social History  Substance Use Topics  . Smoking status: Current Every Day Smoker -- 0.50 packs/day    Types: Cigarettes  . Smokeless tobacco: Never Used  . Alcohol Use: 0.0 oz/week     0 Standard drinks or equivalent per week     Comment: Patient endorses drinking occassionally. Amount unspecified     Review of Systems Constitutional: No fever/chills Eyes: No visual changes. ENT: No sore throat. Cardiovascular: Denies chest pain. Respiratory: Denies shortness of breath. Positive for cough  Gastrointestinal: No abdominal pain.  No nausea, no vomiting.  No diarrhea.  No constipation. Genitourinary: Negative for dysuria. Musculoskeletal: Negative for back pain. Skin: Negative for rash. Neurological: Negative for headaches, focal weakness or numbness.  10-point ROS otherwise negative.  ____________________________________________   PHYSICAL EXAM: BP 128/75 mmHg  Pulse 89  Temp(Src) 98 F (36.7 C)  Resp 18  Ht 5\' 3"  (1.6 m)  Wt 72.576 kg  BMI 28.35 kg/m2  SpO2 96%  LMP 02/20/2015  Breastfeeding? Unknown  VITAL SIGNS: ED Triage Vitals  Enc Vitals Group     BP --      Pulse --      Resp --      Temp --      Temp src --      SpO2 --      Weight --      Height --      Head Cir --  Peak Flow --      Pain Score --      Pain Loc --      Pain Edu? --      Excl. in GC? --     Constitutional: Alert and oriented. Well appearing and in no acute distress. Eyes: Conjunctivae are normal. PERRL. EOMI. Head: Atraumatic. Nose: No congestion/rhinnorhea. Mouth/Throat: Mucous membranes are moist.  Oropharynx non-erythematous. Neck: No stridor.   Cardiovascular: Normal rate, regular rhythm. Grossly normal heart sounds.  Good peripheral circulation. Respiratory: Normal respiratory effort.  No retractions. Lungs coarse breath sounds and wheezing noted bilaterally. Gastrointestinal: Soft and nontender. No distention. No abdominal bruits. No CVA tenderness. Musculoskeletal: No lower extremity tenderness nor edema.  No joint effusions. Neurologic:  Normal speech and language. No gross focal neurologic deficits are appreciated. No gait instability. Skin:   Skin is warm, dry and intact. No rash noted. Psychiatric: Mood and affect are normal. Speech and behavior are normal.  ____________________________________________   LABS (all labs ordered are listed, but only abnormal results are displayed)  Labs Reviewed - No data to display ____________________________________________  RADIOLOGY  Negative for any acute cardiopulmonary findings ____________________________________________   PROCEDURES  Procedure(s) performed: None  Critical Care performed: No  ____________________________________________   INITIAL IMPRESSION / ASSESSMENT AND PLAN / ED COURSE  Pertinent labs & imaging results that were available during my care of the patient were reviewed by me and considered in my medical decision making (see chart for details).  DuoNeb nebulizer 1 given while in the ED.  Acute upper respiratory infection. Rx given for Proventil inhaler, Z-Pak, Robitussin-AC. Patient follow-up PCP or return here with any worsening symptomology. ____________________________________________   FINAL CLINICAL IMPRESSION(S) / ED DIAGNOSES  Final diagnoses:  Acute URI      Evangeline Dakin, PA-C 03/01/15 1245  Myrna Blazer, MD 03/01/15 1517

## 2015-07-06 ENCOUNTER — Encounter (HOSPITAL_COMMUNITY): Payer: Self-pay | Admitting: *Deleted

## 2015-07-06 ENCOUNTER — Ambulatory Visit (HOSPITAL_COMMUNITY)
Admission: EM | Admit: 2015-07-06 | Discharge: 2015-07-06 | Disposition: A | Discharge status: Home / Self Care | Payer: Medicaid Other | Attending: Emergency Medicine | Admitting: Emergency Medicine

## 2015-07-06 DIAGNOSIS — R6884 Jaw pain: Secondary | ICD-10-CM

## 2015-07-06 DIAGNOSIS — R5383 Other fatigue: Secondary | ICD-10-CM | POA: Diagnosis not present

## 2015-07-06 MED ORDER — AMOXICILLIN 875 MG PO TABS: 875.0000 mg | ORAL_TABLET | Freq: Two times a day (BID) | ORAL | Status: DC

## 2015-07-06 MED ORDER — HYDROCODONE-ACETAMINOPHEN 5-325 MG PO TABS: 1.0000 | ORAL_TABLET | Freq: Four times a day (QID) | ORAL | Status: DC | PRN

## 2015-07-06 NOTE — Discharge Instructions (Signed)
I'm worried you have an infection brewing. Take amoxicillin twice a day for the next 7 days. Use the hydrocodone every 4-6 hours as needed for pain. This medicine is safe to take in pregnancy, but use it sparingly. Please follow-up with your OB/GYN in the next week. If things are getting worse, please go to the emergency room.

## 2015-07-06 NOTE — ED Notes (Signed)
Pt  Is  5  Months  Pregnant      She  Reports  Facial  Pain  l  Side  Face  As  Well  As   Weakness       And     Dizzy   She  States  She  Stepped  On a  Nail            Recently  l  Foot  However  She  States  She had a  t  dap  1  Year  Ago    she  Drove  Herself  To  Clinic       She  denys   Any    Vaginal  Bleeding  Or      Discharge

## 2015-07-06 NOTE — ED Provider Notes (Signed)
CSN: [id number]     Arrival date & time 07/06/15  1306 History   First MD Initiated Contact with Patient 07/06/15 1358     Chief Complaint  Patient presents with  . Facial Pain   (Consider location/radiation/quality/duration/timing/severity/associated sxs/prior Treatment) HPI  She is a 25 year old woman here for evaluation of left jaw pain. She states this started 2 days ago, but worse yesterday. She reports pain in the left lower jaw and left temple. She states that side of her face feels weak. She also reports generally feeling tired and fatigued. She reports cold chills and feeling dizzy and lightheaded. She denies any fevers. No ear pain or upper respiratory symptoms. She states the jaw pain is worse with chewing and swallowing. She feels like the glands in her neck are swollen. No nausea or vomiting. She is 5 months pregnant. She has not had any troubles with her pregnancy. She is followed by Dr. Tiburcio Pea. She denies any vaginal bleeding, discharge, contractions. Baby is moving well.  Past Medical History  Diagnosis Date  . Hypertension   . ADD (attention deficit disorder)   . Anxiety   . HA (headache)   . PTSD (post-traumatic stress disorder)   . Depression   . Bipolar 1 disorder [facility])    Past Surgical History  Procedure Laterality Date  . Mouth surgery     Family History  Problem Relation Age of Onset  . Alcohol abuse Mother   . Depression Mother   . Alcohol abuse Father   . Diabetes Father    Social History  Substance Use Topics  . Smoking status: Current Every Day Smoker -- 0.50 packs/day    Types: Cigarettes  . Smokeless tobacco: Never Used  . Alcohol Use: 0.0 oz/week    0 Standard drinks or equivalent per week     Comment: Patient endorses drinking occassionally. Amount unspecified    OB History    Gravida Para Term Preterm AB TAB SAB Ectopic Multiple Living   2              Review of Systems As in history of present illness Allergies  Vicodin  Home  Medications   Prior to Admission medications   Medication Sig Start Date End Date Taking? Authorizing Provider  albuterol (PROVENTIL HFA;VENTOLIN HFA) 108 (90 Base) MCG/ACT inhaler Inhale 2 puffs into the lungs every 6 (six) hours as needed for wheezing or shortness of breath. 03/01/15   Charmayne Sheer Beers, PA-C  amoxicillin (AMOXIL) 875 MG tablet Take 1 tablet (875 mg total) by mouth 2 (two) times daily. 07/06/15   Charm Rings, MD  HYDROcodone-acetaminophen (NORCO) 5-325 MG tablet Take 1 tablet by mouth every 6 (six) hours as needed for moderate pain. 07/06/15   Charm Rings, MD   Meds Ordered and Administered this Visit  Medications - No data to display  BP 102/61 mmHg  Pulse 71  Temp(Src) 98.7 F (37.1 C) (Oral)  Resp 12  SpO2 100%  LMP 02/20/2015 Orthostatic VS for the past 24 hrs:  BP- Lying Pulse- Lying BP- Sitting Pulse- Sitting BP- Standing at 0 minutes Pulse- Standing at 0 minutes  07/06/15 1417 108/69 mmHg 73 113/72 mmHg 85 107/63 mmHg 77    Physical Exam  Constitutional: She is oriented to person, place, and time. She appears well-developed and well-nourished. No distress.  Appears very tired and fatigued.  HENT:  Mouth/Throat: No oropharyngeal exudate.  Minimal oropharyngeal erythema. No erythema or swelling of the gums. No dental tenderness.  She is quite tender along the left lower jaw. She is also tender at the left temple. No TMJ tenderness. TMs are normal bilaterally. No facial droop.  Neck: Neck supple.  Cardiovascular: Normal rate, regular rhythm and normal heart sounds.   No murmur heard. Pulmonary/Chest: Effort normal and breath sounds normal. No respiratory distress. She has no wheezes. She has no rales.  Lymphadenopathy:    She has cervical adenopathy (left submandibular gland slightly swollen and tender.).  Neurological: She is alert and oriented to person, place, and time.    ED Course  Procedures (including critical care time)  Labs Review Labs Reviewed -  No data to display  Imaging Review No results found.   MDM   1. Jaw pain   2. Other fatigue    Orthostatics negative. No clear sign of ear infection, sinus infection, or dental infection. With her systemic symptoms and lymphadenopathy, will cover with amoxicillin. Hydrocodone for pain. Discussed using this sparingly during pregnancy. She will make an appointment with her OB/GYN in the next week for recheck. Return precautions reviewed.    Charm RingsErin J Angy Swearengin, MD 07/06/15 1430

## 2015-08-29 ENCOUNTER — Encounter (HOSPITAL_COMMUNITY): Payer: Self-pay | Admitting: *Deleted

## 2015-08-29 ENCOUNTER — Inpatient Hospital Stay (HOSPITAL_COMMUNITY)
Admission: AD | Admit: 2015-08-29 | Discharge: 2015-08-29 | Disposition: A | Discharge status: Home / Self Care | Payer: Medicaid Other | Source: Ambulatory Visit | Attending: Obstetrics & Gynecology | Admitting: Obstetrics & Gynecology

## 2015-08-29 DIAGNOSIS — Z3A29 29 weeks gestation of pregnancy: Secondary | ICD-10-CM | POA: Insufficient documentation

## 2015-08-29 DIAGNOSIS — O99343 Other mental disorders complicating pregnancy, third trimester: Secondary | ICD-10-CM | POA: Diagnosis not present

## 2015-08-29 DIAGNOSIS — R1013 Epigastric pain: Secondary | ICD-10-CM | POA: Diagnosis not present

## 2015-08-29 DIAGNOSIS — O26893 Other specified pregnancy related conditions, third trimester: Secondary | ICD-10-CM | POA: Insufficient documentation

## 2015-08-29 DIAGNOSIS — O99333 Smoking (tobacco) complicating pregnancy, third trimester: Secondary | ICD-10-CM | POA: Insufficient documentation

## 2015-08-29 DIAGNOSIS — F319 Bipolar disorder, unspecified: Secondary | ICD-10-CM | POA: Diagnosis not present

## 2015-08-29 DIAGNOSIS — O9989 Other specified diseases and conditions complicating pregnancy, childbirth and the puerperium: Secondary | ICD-10-CM

## 2015-08-29 DIAGNOSIS — R109 Unspecified abdominal pain: Secondary | ICD-10-CM

## 2015-08-29 DIAGNOSIS — F4310 Post-traumatic stress disorder, unspecified: Secondary | ICD-10-CM | POA: Insufficient documentation

## 2015-08-29 DIAGNOSIS — O163 Unspecified maternal hypertension, third trimester: Secondary | ICD-10-CM | POA: Diagnosis not present

## 2015-08-29 DIAGNOSIS — F17210 Nicotine dependence, cigarettes, uncomplicated: Secondary | ICD-10-CM | POA: Insufficient documentation

## 2015-08-29 DIAGNOSIS — O26899 Other specified pregnancy related conditions, unspecified trimester: Secondary | ICD-10-CM

## 2015-08-29 LAB — COMPREHENSIVE METABOLIC PANEL
ALK PHOS: 58 U/L (ref 38–126)
ALT: 10 U/L — ABNORMAL LOW (ref 14–54)
AST: 15 U/L (ref 15–41)
Albumin: 3.2 g/dL — ABNORMAL LOW (ref 3.5–5.0)
Anion gap: 8 (ref 5–15)
BILIRUBIN TOTAL: 0.4 mg/dL (ref 0.3–1.2)
BUN: 15 mg/dL (ref 6–20)
CO2: 21 mmol/L — AB (ref 22–32)
Calcium: 9.2 mg/dL (ref 8.9–10.3)
Chloride: 107 mmol/L (ref 101–111)
Creatinine, Ser: 0.63 mg/dL (ref 0.44–1.00)
GFR calc non Af Amer: >60 mL/min (ref >60)
Glucose, Bld: 87 mg/dL (ref 65–99)
Potassium: 3.7 mmol/L (ref 3.5–5.1)
SODIUM: 136 mmol/L (ref 135–145)
TOTAL PROTEIN: 6.3 g/dL — AB (ref 6.5–8.1)

## 2015-08-29 LAB — URINALYSIS, ROUTINE W REFLEX MICROSCOPIC
Bilirubin Urine: NEGATIVE
GLUCOSE, UA: NEGATIVE mg/dL
Hgb urine dipstick: NEGATIVE
Ketones, ur: NEGATIVE mg/dL
Leukocytes, UA: NEGATIVE
Nitrite: NEGATIVE
Protein, ur: NEGATIVE mg/dL
pH: 6 (ref 5.0–8.0)

## 2015-08-29 LAB — CBC WITH DIFFERENTIAL/PLATELET
Basophils Absolute: 0 10*3/uL (ref 0.0–0.1)
Basophils Relative: 0 %
EOS PCT: 1 %
Eosinophils Absolute: 0.1 10*3/uL (ref 0.0–0.7)
HCT: 36.3 % (ref 36.0–46.0)
HEMOGLOBIN: 12.5 g/dL (ref 12.0–15.0)
LYMPHS ABS: 3.4 10*3/uL (ref 0.7–4.0)
Lymphocytes Relative: 21 %
MCH: 31.8 pg (ref 26.0–34.0)
MCHC: 34.4 g/dL (ref 30.0–36.0)
MCV: 92.4 fL (ref 78.0–100.0)
Monocytes Absolute: 0.7 10*3/uL (ref 0.1–1.0)
Monocytes Relative: 4 %
NEUTROS PCT: 74 %
Neutro Abs: 11.9 10*3/uL — ABNORMAL HIGH (ref 1.7–7.7)
Platelets: 251 10*3/uL (ref 150–400)
RBC: 3.93 MIL/uL (ref 3.87–5.11)
RDW: 13 % (ref 11.5–15.5)
WBC: 16.1 10*3/uL — ABNORMAL HIGH (ref 4.0–10.5)

## 2015-08-29 LAB — ETHANOL: Alcohol, Ethyl (B): <5 mg/dL (ref <5)

## 2015-08-29 LAB — RAPID URINE DRUG SCREEN, HOSP PERFORMED
AMPHETAMINES: NOT DETECTED
BENZODIAZEPINES: NOT DETECTED
Barbiturates: NOT DETECTED
Cocaine: NOT DETECTED
Opiates: NOT DETECTED
Tetrahydrocannabinol: POSITIVE — AB

## 2015-08-29 LAB — GLUCOSE, CAPILLARY: GLUCOSE-CAPILLARY: 79 mg/dL (ref 65–99)

## 2015-08-29 MED ORDER — NALBUPHINE HCL 10 MG/ML IJ SOLN
10.0000 mg | Freq: Once | INTRAMUSCULAR | Status: AC
  Administered 2015-08-29: 10 mg via INTRAMUSCULAR
  Filled 2015-08-29: qty 1

## 2015-08-29 MED ORDER — GI COCKTAIL ~~LOC~~
30.0000 mL | Freq: Once | ORAL | Status: AC
  Administered 2015-08-29: 30 mL via ORAL
  Filled 2015-08-29: qty 30

## 2015-08-29 NOTE — MAU Provider Note (Signed)
History   25 y.o. [id number] @ 29.2 wks who gets care in [place] in with abd pain that started this afternoon and progressively gotten worse.States does have trouble with heartburn. And pain is epigastric. Good fetal movement. Denies ROM or vag bleeding.  CSN: [id number]  Arrival date & time 08/29/15  1925   First Provider Initiated Contact with Patient 08/29/15 2003      No chief complaint on file.   HPI  Past Medical History  Diagnosis Date  . Hypertension   . ADD (attention deficit disorder)   . Anxiety   . HA (headache)   . PTSD (post-traumatic stress disorder)   . Depression   . Bipolar 1 disorder [facility](HCC)     Past Surgical History  Procedure Laterality Date  . Mouth surgery      Family History  Problem Relation Age of Onset  . Alcohol abuse Mother   . Depression Mother   . Alcohol abuse Father   . Diabetes Father     Social History  Substance Use Topics  . Smoking status: Current Every Day Smoker -- 0.50 packs/day    Types: Cigarettes  . Smokeless tobacco: Never Used  . Alcohol Use: 0.0 oz/week    0 Standard drinks or equivalent per week     Comment: Patient endorses drinking occassionally. Amount unspecified     OB History    Gravida Para Term Preterm AB TAB SAB Ectopic Multiple Living   5 3 3  1  1   3       Review of Systems  Constitutional: Negative.   HENT: Negative.   Eyes: Negative.   Respiratory: Negative.   Cardiovascular: Negative.   Gastrointestinal: Positive for abdominal pain.  Endocrine: Negative.   Genitourinary: Negative.   Musculoskeletal: Negative.   Skin: Negative.   Allergic/Immunologic: Negative.   Neurological: Negative.   Hematological: Negative.   Psychiatric/Behavioral: Negative.     Allergies  Review of patient's allergies indicates no known allergies.  Home Medications  No current outpatient prescriptions on file.  BP 114/57 mmHg  Pulse 79  Temp(Src) 97.8 F (36.6 C) (Oral)  Resp 22  Ht 5\' 4"  (1.626 m)  Wt  158 lb (71.668 kg)  BMI 27.11 kg/m2  LMP 02/20/2015  Physical Exam  Constitutional: She is oriented to person, place, and time. She appears well-developed and well-nourished.  HENT:  Head: Normocephalic.  Eyes: Pupils are equal, round, and reactive to light.  Neck: Normal range of motion.  Cardiovascular: Normal rate, regular rhythm, normal heart sounds and intact distal pulses.   Pulmonary/Chest: Effort normal and breath sounds normal.  Abdominal: Soft. Bowel sounds are normal.  Genitourinary: Vagina normal and uterus normal.  Musculoskeletal: Normal range of motion.  Neurological: She is alert and oriented to person, place, and time. She has normal reflexes.  Skin: Skin is warm and dry.  Psychiatric: She has a normal mood and affect. Her behavior is normal. Judgment and thought content normal.    MAU Course  Procedures (including critical care time)  Labs Reviewed  URINALYSIS, ROUTINE W REFLEX MICROSCOPIC (NOT AT [facility]) - Abnormal; Notable for the following:    Specific Gravity, Urine >1.030 (*)    All other components within normal limits  CBC WITH DIFFERENTIAL/PLATELET - Abnormal; Notable for the following:    WBC 16.1 (*)    Neutro Abs 11.9 (*)    All other components within normal limits  URINE RAPID DRUG SCREEN, HOSP PERFORMED  COMPREHENSIVE METABOLIC PANEL  ETHANOL  No results found.   No diagnosis found.    MDM  Abd soft non tender,  FHR pattern reassurring, SVE cl/th/post/high.consulted Dr. Debroah Loop regarding elevated wbc but all other findings normal. Ok for pt to be d'cd

## 2015-08-29 NOTE — MAU Note (Signed)
Pt reports very bad pain in her lower abd and lower back today, worsening. Denies bleeding.

## 2015-08-29 NOTE — Discharge Instructions (Signed)

## 2015-11-04 ENCOUNTER — Observation Stay
Admission: EM | Admit: 2015-11-04 | Discharge: 2015-11-04 | Disposition: A | Discharge status: Home / Self Care | Payer: Medicaid Other | Attending: Obstetrics and Gynecology | Admitting: Obstetrics and Gynecology

## 2015-11-04 ENCOUNTER — Encounter: Payer: Self-pay | Admitting: *Deleted

## 2015-11-04 DIAGNOSIS — F419 Anxiety disorder, unspecified: Secondary | ICD-10-CM | POA: Diagnosis not present

## 2015-11-04 DIAGNOSIS — F988 Other specified behavioral and emotional disorders with onset usually occurring in childhood and adolescence: Secondary | ICD-10-CM | POA: Diagnosis not present

## 2015-11-04 DIAGNOSIS — F4310 Post-traumatic stress disorder, unspecified: Secondary | ICD-10-CM | POA: Diagnosis not present

## 2015-11-04 DIAGNOSIS — B1920 Unspecified viral hepatitis C without hepatic coma: Secondary | ICD-10-CM | POA: Diagnosis not present

## 2015-11-04 DIAGNOSIS — O99343 Other mental disorders complicating pregnancy, third trimester: Secondary | ICD-10-CM | POA: Insufficient documentation

## 2015-11-04 DIAGNOSIS — F319 Bipolar disorder, unspecified: Secondary | ICD-10-CM | POA: Insufficient documentation

## 2015-11-04 DIAGNOSIS — F17210 Nicotine dependence, cigarettes, uncomplicated: Secondary | ICD-10-CM | POA: Diagnosis not present

## 2015-11-04 DIAGNOSIS — O98413 Viral hepatitis complicating pregnancy, third trimester: Secondary | ICD-10-CM | POA: Diagnosis not present

## 2015-11-04 DIAGNOSIS — O99333 Smoking (tobacco) complicating pregnancy, third trimester: Secondary | ICD-10-CM | POA: Diagnosis not present

## 2015-11-04 DIAGNOSIS — Z3A38 38 weeks gestation of pregnancy: Secondary | ICD-10-CM | POA: Diagnosis not present

## 2015-11-04 DIAGNOSIS — O471 False labor at or after 37 completed weeks of gestation: Principal | ICD-10-CM | POA: Insufficient documentation

## 2015-11-04 NOTE — Discharge Instructions (Signed)
Discharge and labor precautions/instructions reviewed with patient, patient verbalized understanding of all discharge instructions.  Copy signed and given.

## 2015-11-04 NOTE — OB Triage Note (Signed)
Pt. Starting contracting last night after sexual intercourse, contractions getting strong this morning, pink color discharge noted, mucus plug dispelled four days ago. Pt. Drank  "midwife cocktail" last night around 2100. U/S and toco applied; FHR 140s, abd. Soft, contraction palpated with mild intensity.  Positive for fetal movement;  [facility] for Nitrazine test (negative). Pt. In observation mode at this time. Pain 8/10 with lower abd. Pain with contractions.

## 2015-11-04 NOTE — Discharge Summary (Signed)
Obstetric History and Physical  [name] is a 25 y.o. [phone] with Estimated Date of Delivery: 11/12/15 who presents at [redacted]w[redacted]d  presenting for leaking fluid and contractions. Patient states she has been having regular contractions, scant vaginal bleeding with active fetal movement.    Prenatal Course Source of Care: [facility]  Pregnancy complications or risks: +Hep C antibody, negative RNA Patient Active Problem List   Diagnosis Date Noted  . Labor and delivery, indication for care 11/04/2015  . PTSD (post-traumatic stress disorder)   . Depression   . Bipolar 1 disorder (HCC)   . Major depression (HCC) 02/16/2013  . Alcohol abuse 02/16/2013  . Anxiety state, unspecified 02/16/2013   She plans to breastfeed, plans to bottle feed   Prenatal Transfer Tool   Past Medical History:  Diagnosis Date  . ADD (attention deficit disorder)   . Anxiety   . Bipolar 1 disorder (HCC)   . Depression   . HA (headache)   . Hypertension   . PTSD (post-traumatic stress disorder)     Past Surgical History:  Procedure Laterality Date  . MOUTH SURGERY      OB History  Gravida Para Term Preterm AB Living  5 3 3   1 3   SAB TAB Ectopic Multiple Live Births  1       3    # Outcome Date GA Lbr Len/2nd Weight Sex Delivery Anes PTL Lv  5 Current           4 SAB           3 Term      Vag-Spont   LIV  2 Term      Vag-Spont   LIV  1 Term      Vag-Spont   LIV      Social History   Social History  . Marital status: Married    Spouse name: N/A  . Number of children: 2  . Years of education: 47 th   Social History Main Topics  . Smoking status: Current Every Day Smoker    Packs/day: 0.50    Types: Cigarettes  . Smokeless tobacco: Never Used  . Alcohol use 0.0 oz/week     Comment: Patient endorses drinking occassionally. Amount unspecified   . Drug use: No     Comment: Patient denies   . Sexual activity: Yes    Birth control/ protection: Condom   Other Topics Concern  . None    Social History Narrative   Patient lives at home with friends and she is single.   Unemployed.   Education 10 th grade.   Right handed.   Caffeine Mountain dew and pepsi five or more cups daily.    Family History  Problem Relation Age of Onset  . Alcohol abuse Mother   . Depression Mother   . Alcohol abuse Father   . Diabetes Father     Prescriptions Prior to Admission  Medication Sig Dispense Refill Last Dose  . Prenatal Vit-Fe Fumarate-FA (PRENATAL MULTIVITAMIN) TABS tablet Take 1 tablet by mouth daily at 12 noon.   11/03/2015  . albuterol (PROVENTIL HFA;VENTOLIN HFA) 108 (90 Base) MCG/ACT inhaler Inhale 2 puffs into the lungs every 6 (six) hours as needed for wheezing or shortness of breath. 1 Inhaler 2 Unknown  . amoxicillin (AMOXIL) 875 MG tablet Take 1 tablet (875 mg total) by mouth 2 (two) times daily. 14 tablet 0 Unknown  . HYDROcodone-acetaminophen (NORCO) 5-325 MG tablet Take 1 tablet by mouth  every 6 (six) hours as needed for moderate pain. 15 tablet 0 Unknown    No Known Allergies  Review of Systems: Negative except for what is mentioned in HPI.  Physical Exam: BP (!) 105/59   Pulse 94   Temp 98.1 F (36.7 C) (Oral)   Resp 18   Ht 5\' 4"  (1.626 m)   Wt 166 lb (75.3 kg)   LMP 02/20/2015   BMI 28.49 kg/m  GENERAL: Well-developed, well-nourished female in no acute distress.  LUNGS: no increased work of breathing ABDOMEN: Soft, nontender, nondistended, gravid. EXTREMITIES: Nontender, no edema Cervical Exam: Dilatation 2cm   Effacement 80%   Station -1,no change after ambulating/2 hrs. nitrizine negative, membranes palpated Presentation: cephalic FHT: Category: 1 Baseline rate 140 bpm   Variability moderate  Accelerations present   Decelerations none Contractions: Every 2-6 mins   Pertinent Labs/Studies:   No results found for this or any previous visit (from the past 24 hour(s)).  Assessment : IUP at 2475w6d without evidence of labor or SROM    Plan: Discharge Home - labor precautions

## 2015-11-19 ENCOUNTER — Inpatient Hospital Stay
Admission: AD | Admit: 2015-11-19 | Discharge: 2015-11-22 | DRG: 767 | Disposition: A | Discharge status: Home / Self Care | Payer: Medicaid Other | Source: Ambulatory Visit | Attending: Obstetrics and Gynecology | Admitting: Obstetrics and Gynecology

## 2015-11-19 ENCOUNTER — Encounter: Payer: Self-pay | Admitting: *Deleted

## 2015-11-19 DIAGNOSIS — O99334 Smoking (tobacco) complicating childbirth: Secondary | ICD-10-CM | POA: Diagnosis present

## 2015-11-19 DIAGNOSIS — Z833 Family history of diabetes mellitus: Secondary | ICD-10-CM

## 2015-11-19 DIAGNOSIS — Z3A41 41 weeks gestation of pregnancy: Secondary | ICD-10-CM | POA: Diagnosis not present

## 2015-11-19 DIAGNOSIS — O99344 Other mental disorders complicating childbirth: Secondary | ICD-10-CM | POA: Diagnosis present

## 2015-11-19 DIAGNOSIS — F17210 Nicotine dependence, cigarettes, uncomplicated: Secondary | ICD-10-CM | POA: Diagnosis present

## 2015-11-19 DIAGNOSIS — M25551 Pain in right hip: Secondary | ICD-10-CM | POA: Diagnosis present

## 2015-11-19 DIAGNOSIS — O480 Post-term pregnancy: Secondary | ICD-10-CM | POA: Diagnosis present

## 2015-11-19 DIAGNOSIS — F419 Anxiety disorder, unspecified: Secondary | ICD-10-CM | POA: Diagnosis present

## 2015-11-19 DIAGNOSIS — Z302 Encounter for sterilization: Secondary | ICD-10-CM | POA: Diagnosis not present

## 2015-11-19 DIAGNOSIS — G8929 Other chronic pain: Secondary | ICD-10-CM | POA: Diagnosis present

## 2015-11-19 DIAGNOSIS — Z9851 Tubal ligation status: Secondary | ICD-10-CM

## 2015-11-19 HISTORY — DX: Tubal ligation status: Z98.51

## 2015-11-19 LAB — CBC
HCT: 36.2 % (ref 35.0–47.0)
Hemoglobin: 12.8 g/dL (ref 12.0–16.0)
MCH: 32 pg (ref 26.0–34.0)
MCHC: 35.3 g/dL (ref 32.0–36.0)
MCV: 90.7 fL (ref 80.0–100.0)
PLATELETS: 275 10*3/uL (ref 150–440)
RBC: 3.99 MIL/uL (ref 3.80–5.20)
RDW: 13.7 % (ref 11.5–14.5)
WBC: 15.8 10*3/uL — ABNORMAL HIGH (ref 3.6–11.0)

## 2015-11-19 LAB — URINE DRUG SCREEN, QUALITATIVE (ARMC ONLY)
Amphetamines, Ur Screen: NOT DETECTED
BARBITURATES, UR SCREEN: NOT DETECTED
BENZODIAZEPINE, UR SCRN: NOT DETECTED
Cannabinoid 50 Ng, Ur ~~LOC~~: NOT DETECTED
Cocaine Metabolite,Ur ~~LOC~~: NOT DETECTED
MDMA (Ecstasy)Ur Screen: NOT DETECTED
Methadone Scn, Ur: NOT DETECTED
OPIATE, UR SCREEN: NOT DETECTED
PHENCYCLIDINE (PCP) UR S: NOT DETECTED
Tricyclic, Ur Screen: NOT DETECTED

## 2015-11-19 LAB — TYPE AND SCREEN
ABO/RH(D): A POS
ANTIBODY SCREEN: NEGATIVE

## 2015-11-19 MED ORDER — TERBUTALINE SULFATE 1 MG/ML IJ SOLN: 0.2500 mg | Freq: Once | INTRAMUSCULAR | Status: DC | PRN

## 2015-11-19 MED ORDER — DINOPROSTONE 10 MG VA INST
10.0000 mg | VAGINAL_INSERT | Freq: Once | VAGINAL | Status: AC
  Administered 2015-11-19: 10 mg via VAGINAL
  Filled 2015-11-19: qty 1

## 2015-11-19 MED ORDER — LACTATED RINGERS IV SOLN: 500.0000 mL | INTRAVENOUS | Status: DC | PRN

## 2015-11-19 MED ORDER — OXYTOCIN BOLUS FROM INFUSION
500.0000 mL | Freq: Once | INTRAVENOUS | Status: AC
  Administered 2015-11-20: 500 mL via INTRAVENOUS

## 2015-11-19 MED ORDER — ONDANSETRON HCL 4 MG/2ML IJ SOLN: 4.0000 mg | Freq: Four times a day (QID) | INTRAMUSCULAR | Status: DC | PRN

## 2015-11-19 MED ORDER — LIDOCAINE HCL (PF) 1 % IJ SOLN: 30.0000 mL | INTRAMUSCULAR | Status: DC | PRN

## 2015-11-19 MED ORDER — SOD CITRATE-CITRIC ACID 500-334 MG/5ML PO SOLN: 30.0000 mL | ORAL | Status: DC | PRN

## 2015-11-19 MED ORDER — ACETAMINOPHEN 325 MG PO TABS: 650.0000 mg | ORAL_TABLET | ORAL | Status: DC | PRN

## 2015-11-19 MED ORDER — LACTATED RINGERS IV SOLN
INTRAVENOUS | Status: DC
  Administered 2015-11-19 – 2015-11-20 (×2): via INTRAVENOUS

## 2015-11-19 MED ORDER — OXYTOCIN 10 UNIT/ML IJ SOLN
INTRAMUSCULAR | Status: AC
  Filled 2015-11-19: qty 2

## 2015-11-19 MED ORDER — MISOPROSTOL 200 MCG PO TABS
ORAL_TABLET | ORAL | Status: AC
  Filled 2015-11-19: qty 4

## 2015-11-19 MED ORDER — AMMONIA AROMATIC IN INHA
RESPIRATORY_TRACT | Status: AC
  Filled 2015-11-19: qty 10

## 2015-11-19 MED ORDER — OXYTOCIN 40 UNITS IN LACTATED RINGERS INFUSION - SIMPLE MED
2.5000 [IU]/h | INTRAVENOUS | Status: DC
  Filled 2015-11-19 (×2): qty 1000

## 2015-11-19 MED ORDER — LIDOCAINE HCL (PF) 1 % IJ SOLN
INTRAMUSCULAR | Status: AC
  Filled 2015-11-19: qty 30

## 2015-11-19 MED ORDER — BUTORPHANOL TARTRATE 1 MG/ML IJ SOLN
1.0000 mg | INTRAMUSCULAR | Status: DC | PRN
  Administered 2015-11-20: 1 mg via INTRAVENOUS
  Filled 2015-11-19: qty 1

## 2015-11-19 NOTE — Progress Notes (Signed)
Chaplain rounded the unit to provide a compassionate presence and spritual support to the patient. Advance Directive information was given and left for patient further perusal and completion. Jefm PettyChaplain Saphira Lahmann [phone]

## 2015-11-19 NOTE — H&P (Signed)
Obstetric H&P   Chief Complaint: IOL  Prenatal Care Provider: [facility]  History of Present Illness: 25 y.o. [id number] 4950w0d by 11/12/2015, presenting for postdates IOL.  No VB, no LOF, no VB.  PC notable for positive HepC antibody, HCV RNA negative though.  16lbs weight gain this pregnancy.  Pelvis tested to 8lbs  PNL A pos / ABSC neg / RI / VZI / HBsAg neg / HIV neg / RPR NR / 1-hr / 80 GBS negative   TDAP received 09/07/2015  Review of Systems: 10 point review of systems negative unless otherwise noted in HPI  Past Medical History: Past Medical History:  Diagnosis Date  . ADD (attention deficit disorder)   . Anxiety   . Bipolar 1 disorder (HCC)   . Depression   . HA (headache)   . Hypertension   . PTSD (post-traumatic stress disorder)     Past Surgical History: Past Surgical History:  Procedure Laterality Date  . MOUTH SURGERY      Family History: Family History  Problem Relation Age of Onset  . Alcohol abuse Mother   . Depression Mother   . Alcohol abuse Father   . Diabetes Father     Social History: Social History   Social History  . Marital status: Married    Spouse name: N/A  . Number of children: 2  . Years of education: 10 th   Occupational History  . Not on file.   Social History Main Topics  . Smoking status: Current Every Day Smoker    Packs/day: 0.50    Types: Cigarettes  . Smokeless tobacco: Never Used  . Alcohol use 0.0 oz/week     Comment: Patient endorses drinking occassionally. Amount unspecified   . Drug use: No     Comment: Patient denies   . Sexual activity: Yes    Birth control/ protection: Condom   Other Topics Concern  . Not on file   Social History Narrative   Patient lives at home with friends and she is single.   Unemployed.   Education 10 th grade.   Right handed.   Caffeine Mountain dew and pepsi five or more cups daily.    Medications: Prior to Admission medications   Medication Sig Start Date End Date Taking?  Authorizing Provider  Prenatal Vit-Fe Fumarate-FA (PRENATAL MULTIVITAMIN) TABS tablet Take 1 tablet by mouth daily at 12 noon.   Yes Historical Provider, MD  albuterol (PROVENTIL HFA;VENTOLIN HFA) 108 (90 Base) MCG/ACT inhaler Inhale 2 puffs into the lungs every 6 (six) hours as needed for wheezing or shortness of breath. 03/01/15   Charmayne Sheerharles M Beers, PA-C  amoxicillin (AMOXIL) 875 MG tablet Take 1 tablet (875 mg total) by mouth 2 (two) times daily. 07/06/15   Charm RingsErin J Honig, MD  HYDROcodone-acetaminophen (NORCO) 5-325 MG tablet Take 1 tablet by mouth every 6 (six) hours as needed for moderate pain. 07/06/15   Charm RingsErin J Honig, MD    Allergies: No Known Allergies  Physical Exam: Vitals: Blood pressure 106/63, pulse 84, temperature 98.2 F (36.8 C), temperature source Oral, resp. rate 20, height 5\' 3"  (1.6 m), weight 172 lb (78 kg), last menstrual period 02/20/2015, unknown if currently breastfeeding.  Urine Dip Protein: N/A  FHT: 130, moderate variability, + accels, no decels Toco: none  General: NAD HEENT: normocephalic, anicteric Pulmonary: no increased work of breathing Abdomen: Gravid, non-tender Leopolds: vtx Genitourinary: 1cm Extremities: no edema  Labs: Results for orders placed or performed during the hospital encounter of 11/19/15 (  from the past 24 hour(s))  CBC     Status: Abnormal   Collection Time: 11/19/15  8:36 AM  Result Value Ref Range   WBC 15.8 (H) 3.6 - 11.0 K/uL   RBC 3.99 3.80 - 5.20 MIL/uL   Hemoglobin 12.8 12.0 - 16.0 g/dL   HCT 16.1 09.6 - 04.5 %   MCV 90.7 80.0 - 100.0 fL   MCH 32.0 26.0 - 34.0 pg   MCHC 35.3 32.0 - 36.0 g/dL   RDW 40.9 81.1 - 91.4 %   Platelets 275 150 - 440 K/uL  Type and screen [facility]     Status: None (Preliminary result)   Collection Time: 11/19/15  8:36 AM  Result Value Ref Range   ABO/RH(D) PENDING    Antibody Screen PENDING    Sample Expiration 11/22/2015     Assessment: 24 y.o. [id number] [redacted]w[redacted]d by  11/12/2015 postdates IOL  Plan: 1) Labor - plan on cervidil IOL  2) Fetus -cat I tracing  3) PNL A pos / ABSC neg / RI / VZI / HBsAg neg / HIV neg / RPR NR / 1-hr / 80 GBS negative   4) TDAP - 09/07/15  5) Breast/ BTL  6) Disposition - pending delivery

## 2015-11-20 ENCOUNTER — Encounter: Payer: Self-pay | Admitting: Anesthesiology

## 2015-11-20 ENCOUNTER — Inpatient Hospital Stay: Payer: Medicaid Other | Admitting: Anesthesiology

## 2015-11-20 LAB — RPR: RPR Ser Ql: NONREACTIVE

## 2015-11-20 LAB — CBC WITH DIFFERENTIAL/PLATELET
Basophils Absolute: 0.1 10*3/uL (ref 0–0.1)
Basophils Relative: 1 %
EOS ABS: 0.2 10*3/uL (ref 0–0.7)
EOS PCT: 1 %
HCT: 37.7 % (ref 35.0–47.0)
Hemoglobin: 12.8 g/dL (ref 12.0–16.0)
LYMPHS ABS: 3 10*3/uL (ref 1.0–3.6)
Lymphocytes Relative: 18 %
MCH: 30.8 pg (ref 26.0–34.0)
MCHC: 33.9 g/dL (ref 32.0–36.0)
MCV: 90.8 fL (ref 80.0–100.0)
MONOS PCT: 6 %
Monocytes Absolute: 1 10*3/uL — ABNORMAL HIGH (ref 0.2–0.9)
Neutro Abs: 12.3 10*3/uL — ABNORMAL HIGH (ref 1.4–6.5)
Neutrophils Relative %: 74 %
PLATELETS: 297 10*3/uL (ref 150–440)
RBC: 4.15 MIL/uL (ref 3.80–5.20)
RDW: 13.9 % (ref 11.5–14.5)
WBC: 16.6 10*3/uL — AB (ref 3.6–11.0)

## 2015-11-20 MED ORDER — WITCH HAZEL-GLYCERIN EX PADS: 1.0000 "application " | MEDICATED_PAD | CUTANEOUS | Status: DC | PRN

## 2015-11-20 MED ORDER — LIDOCAINE HCL (PF) 1 % IJ SOLN
INTRAMUSCULAR | Status: DC | PRN
  Administered 2015-11-20: 3 mL

## 2015-11-20 MED ORDER — BENZOCAINE-MENTHOL 20-0.5 % EX AERO: 1.0000 "application " | INHALATION_SPRAY | CUTANEOUS | Status: DC | PRN

## 2015-11-20 MED ORDER — BUPIVACAINE HCL (PF) 0.25 % IJ SOLN
INTRAMUSCULAR | Status: DC | PRN
  Administered 2015-11-20: 10 mL via EPIDURAL

## 2015-11-20 MED ORDER — OXYTOCIN 40 UNITS IN LACTATED RINGERS INFUSION - SIMPLE MED
1.0000 m[IU]/min | INTRAVENOUS | Status: DC
  Administered 2015-11-20: 2 m[IU]/min via INTRAVENOUS

## 2015-11-20 MED ORDER — SENNOSIDES-DOCUSATE SODIUM 8.6-50 MG PO TABS
2.0000 | ORAL_TABLET | Freq: Every day | ORAL | Status: DC
  Administered 2015-11-21 – 2015-11-22 (×2): 2 via ORAL
  Filled 2015-11-20 (×2): qty 2

## 2015-11-20 MED ORDER — FENTANYL 2.5 MCG/ML W/ROPIVACAINE 0.2% IN NS 100 ML EPIDURAL INFUSION (ARMC-ANES)
EPIDURAL | Status: AC
  Administered 2015-11-20: 250 ug
  Filled 2015-11-20: qty 100

## 2015-11-20 MED ORDER — COCONUT OIL OIL: 1.0000 "application " | TOPICAL_OIL | Status: DC | PRN

## 2015-11-20 MED ORDER — ONDANSETRON HCL 4 MG/2ML IJ SOLN: 4.0000 mg | INTRAMUSCULAR | Status: DC | PRN

## 2015-11-20 MED ORDER — SODIUM CHLORIDE FLUSH 0.9 % IV SOLN
INTRAVENOUS | Status: AC
  Filled 2015-11-20: qty 10

## 2015-11-20 MED ORDER — OXYCODONE HCL 5 MG PO TABS: 5.0000 mg | ORAL_TABLET | ORAL | Status: DC | PRN

## 2015-11-20 MED ORDER — PRENATAL MULTIVITAMIN CH
1.0000 | ORAL_TABLET | Freq: Every day | ORAL | Status: DC
  Administered 2015-11-21 – 2015-11-22 (×2): 1 via ORAL
  Filled 2015-11-20 (×3): qty 1

## 2015-11-20 MED ORDER — DIBUCAINE 1 % RE OINT: 1.0000 "application " | TOPICAL_OINTMENT | RECTAL | Status: DC | PRN

## 2015-11-20 MED ORDER — IBUPROFEN 600 MG PO TABS
600.0000 mg | ORAL_TABLET | Freq: Four times a day (QID) | ORAL | Status: DC | PRN
Start: 2015-11-20 — End: 2015-11-22
  Administered 2015-11-21: 600 mg via ORAL
  Filled 2015-11-20: qty 1

## 2015-11-20 MED ORDER — FERROUS SULFATE 325 (65 FE) MG PO TABS
325.0000 mg | ORAL_TABLET | Freq: Every day | ORAL | Status: DC
  Administered 2015-11-21 – 2015-11-22 (×2): 325 mg via ORAL
  Filled 2015-11-20 (×2): qty 1

## 2015-11-20 MED ORDER — OXYCODONE HCL 5 MG PO TABS
10.0000 mg | ORAL_TABLET | ORAL | Status: DC | PRN
  Administered 2015-11-20 – 2015-11-22 (×8): 10 mg via ORAL
  Filled 2015-11-20 (×8): qty 2

## 2015-11-20 MED ORDER — SIMETHICONE 80 MG PO CHEW: 80.0000 mg | CHEWABLE_TABLET | ORAL | Status: DC | PRN

## 2015-11-20 MED ORDER — TETANUS-DIPHTH-ACELL PERTUSSIS 5-2.5-18.5 LF-MCG/0.5 IM SUSP: 0.5000 mL | Freq: Once | INTRAMUSCULAR | Status: DC

## 2015-11-20 MED ORDER — ONDANSETRON HCL 4 MG PO TABS: 4.0000 mg | ORAL_TABLET | ORAL | Status: DC | PRN

## 2015-11-20 MED ORDER — LIDOCAINE-EPINEPHRINE (PF) 1.5 %-1:200000 IJ SOLN
INTRAMUSCULAR | Status: DC | PRN
  Administered 2015-11-20: 3 mL via PERINEURAL

## 2015-11-20 NOTE — Progress Notes (Signed)
L&D Note (late note)  Subjective: Comfortable now after epidural placed.  Feeling rectal pressure.  Objective: Temp 98.5, HR 66, BP 101/54 General: Appears comfortable EFM: Baseline 130, moderate variability, accels to 140's, no decelerations Ctx: Irregular, with some triplets, pitocin at 4 milliunits Cervix: 5/70/0, BOW intact  Assessment: Progressing FWB: Category 1  Plan: Plan on AROM with next check.  Anticipate SVD.  Continue pitocin.  Tonye PearsonLauren Friedman SNM/Eileene Kisling, Jill SideOLLEEN, CNM

## 2015-11-20 NOTE — Anesthesia Preprocedure Evaluation (Signed)
Anesthesia Evaluation  Patient identified by MRN, date of birth, ID band Patient awake    Reviewed: Allergy & Precautions, NPO status , Patient's Chart, lab work & pertinent test results  Airway Mallampati: II       Dental no notable dental hx.    Pulmonary asthma , Current Smoker,    Pulmonary exam normal        Cardiovascular hypertension, Normal cardiovascular exam     Neuro/Psych  Headaches, PSYCHIATRIC DISORDERS Anxiety Depression Bipolar Disorder    GI/Hepatic negative GI ROS, Neg liver ROS,   Endo/Other  negative endocrine ROS  Renal/GU negative Renal ROS  negative genitourinary   Musculoskeletal negative musculoskeletal ROS (+)   Abdominal Normal abdominal exam  (+)   Peds negative pediatric ROS (+)  Hematology negative hematology ROS (+)   Anesthesia Other Findings   Reproductive/Obstetrics (+) Pregnancy                             Anesthesia Physical Anesthesia Plan  ASA: II  Anesthesia Plan: Epidural   Post-op Pain Management:    Induction: Intravenous  Airway Management Planned: Natural Airway  Additional Equipment:   Intra-op Plan:   Post-operative Plan:   Informed Consent: I have reviewed the patients History and Physical, chart, labs and discussed the procedure including the risks, benefits and alternatives for the proposed anesthesia with the patient or authorized representative who has indicated his/her understanding and acceptance.   Dental advisory given  Plan Discussed with: CRNA and Surgeon  Anesthesia Plan Comments:         Anesthesia Quick Evaluation

## 2015-11-20 NOTE — Anesthesia Procedure Notes (Signed)
Epidural Patient location during procedure: OB Start time: 11/20/2015 8:59 AM End time: 11/20/2015 9:12 AM  Staffing Anesthesiologist: Yves DillARROLL, Amerigo Mcglory Performed: anesthesiologist   Preanesthetic Checklist Completed: patient identified, site marked, surgical consent, pre-op evaluation, timeout performed, IV checked, risks and benefits discussed and monitors and equipment checked  Epidural Patient position: sitting Prep: Betadine and site prepped and draped Patient monitoring: heart rate, cardiac monitor, continuous pulse ox and blood pressure Approach: midline Location: L3-L4 Injection technique: LOR air  Needle:  Needle type: Tuohy  Needle gauge: 17 G Needle length: 9 cm Catheter type: closed end flexible Catheter size: 19 Gauge Test dose: negative and 1.5% lidocaine with Epi 1:200 K  Assessment Sensory level: T8  Additional Notes Time out called.  Patient placed in sitting position.  Back prepped and draped in sterile fashion.  A skin wheal was made in the L3-L4 interspace using 1% Lidocaine plain.  A 17 G Tuohy needle was guided into the epidural space by a loss of resistance technique.  The epidural catheter threaded easily about 3 cm into the epidural space and the test dose was negative.  No blood or paresthesias.  The catheter was affixed to the back in sterile fashion.  The patient tolerated the procedure well.

## 2015-11-20 NOTE — Discharge Instructions (Signed)
Vaginal Delivery, Care After Refer to this sheet in the next few weeks. These discharge instructions provide you with information on caring for yourself after delivery. Your caregiver may also give you specific instructions. Your treatment has been planned according to the most current medical practices available, but problems sometimes occur. Call your caregiver if you have any problems or questions after you go home. HOME CARE INSTRUCTIONS 1. Take over-the-counter or prescription medicines only as directed by your caregiver or pharmacist. 2. Do not drink alcohol, especially if you are breastfeeding or taking medicine to relieve pain. 3. Do not smoke tobacco. 4. Continue to use good perineal care. Good perineal care includes: 1. Wiping your perineum from back to front 2. Keeping your perineum clean. 3. You can do sitz baths twice a day, to help keep this area clean 5. Do not use tampons, douche or have sex until your caregiver says it is okay. 6. Shower only and avoid sitting in submerged water, aside from sitz baths 7. Wear a well-fitting bra that provides breast support. 8. Eat healthy foods. 9. Drink enough fluids to keep your urine clear or pale yellow. 10. Eat high-fiber foods such as whole grain cereals and breads, brown rice, beans, and fresh fruits and vegetables every day. These foods may help prevent or relieve constipation. 11. Avoid constipation with high fiber foods or medications, such as miralax or metamucil 12. Follow your caregiver's recommendations regarding resumption of activities such as climbing stairs, driving, lifting, exercising, or traveling. 13. Talk to your caregiver about resuming sexual activities. Resumption of sexual activities is dependent upon your risk of infection, your rate of healing, and your comfort and desire to resume sexual activity. 14. Try to have someone help you with your household activities and your newborn for at least a few days after you leave  the hospital. 15. Rest as much as possible. Try to rest or take a nap when your newborn is sleeping. 16. Increase your activities gradually. 17. Keep all of your scheduled postpartum appointments. It is very important to keep your scheduled follow-up appointments. At these appointments, your caregiver will be checking to make sure that you are healing physically and emotionally. SEEK MEDICAL CARE IF:   You are passing large clots from your vagina. Save any clots to show your caregiver.  You have a foul smelling discharge from your vagina.  You have trouble urinating.  You are urinating frequently.  You have pain when you urinate.  You have a change in your bowel movements.  You have increasing redness, pain, or swelling near your vaginal incision (episiotomy) or vaginal tear.  You have pus draining from your episiotomy or vaginal tear.  Your episiotomy or vaginal tear is separating.  You have painful, hard, or reddened breasts.  You have a severe headache.  You have blurred vision or see spots.  You feel sad or depressed.  You have thoughts of hurting yourself or your newborn.  You have questions about your care, the care of your newborn, or medicines.  You are dizzy or light-headed.  You have a rash.  You have nausea or vomiting.  You were breastfeeding and have not had a menstrual period within 12 weeks after you stopped breastfeeding.  You are not breastfeeding and have not had a menstrual period by the 12th week after delivery.  You have a fever. SEEK IMMEDIATE MEDICAL CARE IF:   You have persistent pain.  You have chest pain.  You have shortness of breath.    You faint.  You have leg pain.  You have stomach pain.  Your vaginal bleeding saturates two or more sanitary pads in 1 hour. MAKE SURE YOU:   Understand these instructions.  Will watch your condition.  Will get help right away if you are not doing well or get worse. Document Released:  01/28/2000 Document Revised: 06/16/2013 Document Reviewed: 09/27/2011 ExitCare Patient Information 2015 [employer]. This information is not intended to replace advice given to you by your health care provider. Make sure you discuss any questions you have with your health care provider.  Sitz Bath A sitz bath is a warm water bath taken in the sitting position. The water covers only the hips and butt (buttocks). We recommend using one that fits in the toilet, to help with ease of use and cleanliness. It may be used for either healing or cleaning purposes. Sitz baths are also used to relieve pain, itching, or muscle tightening (spasms). The water may contain medicine. Moist heat will help you heal and relax.  HOME CARE  Take 3 to 4 sitz baths a day. 18. Fill the bathtub half-full with warm water. 19. Sit in the water and open the drain a little. 20. Turn on the warm water to keep the tub half-full. Keep the water running constantly. 21. Soak in the water for 15 to 20 minutes. 22. After the sitz bath, pat the affected area dry. GET HELP RIGHT AWAY IF: You get worse instead of better. Stop the sitz baths if you get worse. MAKE SURE YOU:  Understand these instructions.  Will watch your condition.  Will get help right away if you are not doing well or get worse. Document Released: 03/09/2004 Document Revised: 10/25/2011 Document Reviewed: 05/30/2010 ExitCare Patient Information 2015 [employer]. This information is not intended to replace advice given to you by your health care provider. Make sure you discuss any questions you have with your health care provider.    
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
## 2015-11-20 NOTE — Plan of Care (Signed)
Problem: Nutrition: Goal: Adequate nutrition will be maintained Outcome: Progressing Will be NPO after Midnight for Tubal

## 2015-11-21 ENCOUNTER — Encounter: Payer: Self-pay | Admitting: Anesthesiology

## 2015-11-21 ENCOUNTER — Inpatient Hospital Stay: Payer: Medicaid Other | Admitting: Certified Registered"

## 2015-11-21 ENCOUNTER — Encounter
Admission: AD | Disposition: A | Discharge status: Home / Self Care | Payer: Self-pay | Source: Ambulatory Visit | Attending: Obstetrics and Gynecology

## 2015-11-21 DIAGNOSIS — Z9851 Tubal ligation status: Secondary | ICD-10-CM

## 2015-11-21 HISTORY — DX: Tubal ligation status: Z98.51

## 2015-11-21 HISTORY — PX: TUBAL LIGATION: SHX77

## 2015-11-21 LAB — CBC
HEMATOCRIT: 35.7 % (ref 35.0–47.0)
Hemoglobin: 12.2 g/dL (ref 12.0–16.0)
MCH: 31.1 pg (ref 26.0–34.0)
MCHC: 34.2 g/dL (ref 32.0–36.0)
MCV: 90.9 fL (ref 80.0–100.0)
PLATELETS: 264 10*3/uL (ref 150–440)
RBC: 3.93 MIL/uL (ref 3.80–5.20)
RDW: 13.5 % (ref 11.5–14.5)
WBC: 16.9 10*3/uL — AB (ref 3.6–11.0)

## 2015-11-21 SURGERY — LIGATION, FALLOPIAN TUBE, POSTPARTUM
Anesthesia: Epidural | Laterality: Bilateral | Wound class: Clean Contaminated

## 2015-11-21 MED ORDER — LACTATED RINGERS IV SOLN: INTRAVENOUS | Status: DC

## 2015-11-21 MED ORDER — PROPOFOL 10 MG/ML IV BOLUS
INTRAVENOUS | Status: DC | PRN
  Administered 2015-11-21: 150 mg via INTRAVENOUS

## 2015-11-21 MED ORDER — FENTANYL CITRATE (PF) 100 MCG/2ML IJ SOLN
INTRAMUSCULAR | Status: AC
  Filled 2015-11-21: qty 2

## 2015-11-21 MED ORDER — FENTANYL CITRATE (PF) 100 MCG/2ML IJ SOLN
INTRAMUSCULAR | Status: AC
  Administered 2015-11-21: 25 ug via INTRAVENOUS
  Filled 2015-11-21: qty 2

## 2015-11-21 MED ORDER — MIDAZOLAM HCL 2 MG/2ML IJ SOLN
INTRAMUSCULAR | Status: DC | PRN
  Administered 2015-11-21: 2 mg via INTRAVENOUS

## 2015-11-21 MED ORDER — ONDANSETRON HCL 4 MG/2ML IJ SOLN: 4.0000 mg | Freq: Once | INTRAMUSCULAR | Status: DC | PRN

## 2015-11-21 MED ORDER — FENTANYL CITRATE (PF) 100 MCG/2ML IJ SOLN
25.0000 ug | INTRAMUSCULAR | Status: DC | PRN
  Administered 2015-11-21 (×4): 25 ug via INTRAVENOUS

## 2015-11-21 MED ORDER — ONDANSETRON HCL 4 MG/2ML IJ SOLN
INTRAMUSCULAR | Status: DC | PRN
  Administered 2015-11-21: 4 mg via INTRAVENOUS

## 2015-11-21 MED ORDER — LACTATED RINGERS IV SOLN
INTRAVENOUS | Status: DC | PRN
  Administered 2015-11-21: 11:00:00 via INTRAVENOUS

## 2015-11-21 MED ORDER — FENTANYL CITRATE (PF) 100 MCG/2ML IJ SOLN
INTRAMUSCULAR | Status: DC | PRN
  Administered 2015-11-21: 25 ug via INTRAVENOUS
  Administered 2015-11-21: 100 ug via INTRAVENOUS
  Administered 2015-11-21: 25 ug via INTRAVENOUS

## 2015-11-21 MED ORDER — BUPIVACAINE-EPINEPHRINE (PF) 0.25% -1:200000 IJ SOLN
INTRAMUSCULAR | Status: DC | PRN
  Administered 2015-11-21: 4 mL

## 2015-11-21 MED ORDER — DEXAMETHASONE SODIUM PHOSPHATE 10 MG/ML IJ SOLN
INTRAMUSCULAR | Status: DC | PRN
  Administered 2015-11-21: 4 mg via INTRAVENOUS

## 2015-11-21 MED ORDER — SUCCINYLCHOLINE CHLORIDE 20 MG/ML IJ SOLN
INTRAMUSCULAR | Status: DC | PRN
  Administered 2015-11-21: 80 mg via INTRAVENOUS

## 2015-11-21 MED ORDER — HYDROMORPHONE HCL 1 MG/ML IJ SOLN
1.0000 mg | INTRAMUSCULAR | Status: DC | PRN
  Administered 2015-11-21 (×2): 1 mg via INTRAVENOUS
  Filled 2015-11-21 (×2): qty 1

## 2015-11-21 MED ORDER — LIDOCAINE HCL (CARDIAC) 20 MG/ML IV SOLN
INTRAVENOUS | Status: DC | PRN
  Administered 2015-11-21: 80 mg via INTRAVENOUS

## 2015-11-21 MED ORDER — BUPIVACAINE-EPINEPHRINE (PF) 0.25% -1:200000 IJ SOLN
INTRAMUSCULAR | Status: AC
  Filled 2015-11-21: qty 30

## 2015-11-21 SURGICAL SUPPLY — 28 items
BLADE SURG SZ11 CARB STEEL (BLADE) ×3 IMPLANT
CHLORAPREP W/TINT 26ML (MISCELLANEOUS) ×3 IMPLANT
DRAPE LAPAROTOMY 100X77 ABD (DRAPES) ×3 IMPLANT
ELECT CAUTERY BLADE 6.4 (BLADE) ×3 IMPLANT
ELECT REM PT RETURN 9FT ADLT (ELECTROSURGICAL) ×3
ELECTRODE REM PT RTRN 9FT ADLT (ELECTROSURGICAL) ×1 IMPLANT
GLOVE BIO SURGEON STRL SZ7 (GLOVE) ×7 IMPLANT
GLOVE BIOGEL PI IND STRL 7.5 (GLOVE) ×1 IMPLANT
GLOVE BIOGEL PI INDICATOR 7.5 (GLOVE) ×4
GOWN STRL REUS W/ TWL LRG LVL3 (GOWN DISPOSABLE) ×2 IMPLANT
GOWN STRL REUS W/ TWL XL LVL3 (GOWN DISPOSABLE) ×1 IMPLANT
GOWN STRL REUS W/TWL LRG LVL3 (GOWN DISPOSABLE) ×6
GOWN STRL REUS W/TWL XL LVL3 (GOWN DISPOSABLE)
KIT RM TURNOVER CYSTO AR (KITS) ×3 IMPLANT
LABEL OR SOLS (LABEL) ×3 IMPLANT
LIQUID BAND (GAUZE/BANDAGES/DRESSINGS) ×3 IMPLANT
NDL SAFETY 22GX1.5 (NEEDLE) ×3 IMPLANT
NS IRRIG 500ML POUR BTL (IV SOLUTION) ×3 IMPLANT
PACK BASIN MINOR ARMC (MISCELLANEOUS) ×3 IMPLANT
RETRACTOR WOUND ALXS 18CM SML (MISCELLANEOUS) IMPLANT
RTRCTR WOUND ALEXIS O 18CM SML (MISCELLANEOUS) ×3
SPONGE LAP 4X18 5PK (MISCELLANEOUS) ×2 IMPLANT
SUT MNCRL 4-0 (SUTURE) ×3
SUT MNCRL 4-0 27XMFL (SUTURE) ×1
SUT PLAIN GUT 0 (SUTURE) ×6 IMPLANT
SUT VIC AB 2-0 UR6 27 (SUTURE) ×3 IMPLANT
SUTURE MNCRL 4-0 27XMF (SUTURE) IMPLANT
SYRINGE 10CC LL (SYRINGE) ×3 IMPLANT

## 2015-11-21 NOTE — Anesthesia Procedure Notes (Signed)
Procedure Name: Intubation Performed by: Trisa Cranor Pre-anesthesia Checklist: Patient identified, Patient being monitored, Timeout performed, Emergency Drugs available and Suction available Patient Re-evaluated:Patient Re-evaluated prior to inductionOxygen Delivery Method: Circle system utilized Preoxygenation: Pre-oxygenation with 100% oxygen Intubation Type: IV induction, Cricoid Pressure applied and Rapid sequence Ventilation: Mask ventilation without difficulty Laryngoscope Size: Mac and 3 Grade View: Grade I Tube type: Oral Tube size: 7.0 mm Number of attempts: 1 Airway Equipment and Method: Stylet Placement Confirmation: ETT inserted through vocal cords under direct vision,  positive ETCO2 and breath sounds checked- equal and bilateral Secured at: 22 cm Tube secured with: Tape Dental Injury: Teeth and Oropharynx as per pre-operative assessment        

## 2015-11-21 NOTE — Op Note (Signed)
  Operative Note    Pre-Op Diagnosis: multiparity, desires permanent sterilization  Post-Op Diagnosis: multiparity, desires permanent sterilization  Procedures:  Postpartum bilateral tubal ligation via Pomeroy technique  Primary Surgeon: Thomasene MohairStephen Dayanis Bergquist, MD   EBL: 10 mL   IVF: 400 mL   Specimens: segment of right and left fallopian tubes  Drains: None  Complications: None   Disposition: PACU   Condition: Stable   Findings: normal appearing uterus and bilateral fallopian tubes  Procedure Summary:  The patient was taken to the operating room where general anesthesia is administered and found to be adequate. After timeout was called a small transverse, infraumbilical incision was made with the scalpel. The incision was carried down through the fascia until the peritoneum was identified and entered. The peritoneum was noted to be free of any adhesions and the incision was then extended.  The small Alexis retractor was placed for improved visualization.  The inner ring was inspected and found to be clear of bowel and other tissue.    The patient's right fallopian tube was identified, brought incision, and grasped with a Babcock clamp. The tube was then followed out to the fimbria. The Babcock clamp was then used to grasp the tube approximately 4 cm from the cornual region. A 3 cm segment of tube was then ligated with the 2 free ties of plain gut, and excised. Good hemostasis was noted and the tube was returned to the abdomen. The left fallopian tube was then ligated, and a 3 cm segment excised in a similar fashion. Excellent hemostasis was noted, and the tube returned to the abdomen.    The Alexis retractor was then removed without difficulty.   The peritoneum and fascia were closed in a single layer using 3-0 Vicryl. The skin was closed in a subcuticular fashion using 3-0 vicryl, undyed. The closure was also closed with Dermabond.  The patient tolerated the procedure well.   Sponge, lap, needle, and instrument counts were correct x 2.  VTE prophylaxis: SCDs. Antibiotic prophylaxis: None given nor indicated. She was awakened in the operating room and was taken to the PACU in stable condition.   Thomasene MohairStephen Amirr Achord, MD 11/21/2015 12:13 PM

## 2015-11-21 NOTE — Transfer of Care (Signed)
Immediate Anesthesia Transfer of Care Note  Patient: [name]  Procedure(s) Performed: Procedure(s): POST PARTUM TUBAL LIGATION (Bilateral)  Patient Location: PACU  Anesthesia Type:General  Level of Consciousness: sedated and responds to stimulation  Airway & Oxygen Therapy: Patient Spontanous Breathing and Patient connected to face mask oxygen  Post-op Assessment: Report given to RN and Post -op Vital signs reviewed and stable  Post vital signs: Reviewed and stable  Last Vitals:  Vitals:   11/21/15 0736 11/21/15 1221  BP: 112/62 121/70  Pulse: 70 (!) 109  Resp: 16 (!) 26  Temp: 36.6 C     Last Pain:  Vitals:   11/21/15 0800  TempSrc:   PainSc: 5          Complications: No apparent anesthesia complications

## 2015-11-21 NOTE — Progress Notes (Signed)
Patient ID: Flossie BuffyHeather L Giacobbe, female   DOB: 07-26-1990, 26 y.o.   MRN: [id number] Obstetric Postpartum Daily Progress Note Subjective:  25 y.o. [id number] postpartum day #1 status post vaginal delivery.  She is ambulating, is tolerating po however she has been NPO since midnight last night, is voiding spontaneously.  Her pain is well controlled on PO pain medications. Her lochia is less than menses.   Medications SCHEDULED MEDICATIONS  . ferrous sulfate  325 mg Oral Q breakfast  . prenatal multivitamin  1 tablet Oral Q1200  . senna-docusate  2 tablet Oral Daily  . Tdap  0.5 mL Intramuscular Once    MEDICATION INFUSIONS     PRN MEDICATIONS  benzocaine-Menthol, coconut oil, witch hazel-glycerin **AND** dibucaine, ibuprofen, ondansetron **OR** ondansetron (ZOFRAN) IV, oxyCODONE, oxyCODONE, simethicone    Objective:   Vitals:   11/21/15 0038 11/21/15 0443 11/21/15 0453 11/21/15 0736  BP: (!) 117/56 (!) 99/56 117/72 112/62  Pulse: 82 79 87 70  Resp:    16  Temp: 98.5 F (36.9 C) 98.4 F (36.9 C)  97.9 F (36.6 C)  TempSrc: Oral Oral  Oral  SpO2: 98% 97%  100%  Weight:      Height:        Current Vital Signs 24h Vital Sign Ranges  T 97.9 F (36.6 C) Temp  Avg: 98.3 F (36.8 C)  Min: 97.8 F (36.6 C)  Max: 98.7 F (37.1 C)  BP 112/62 BP  Min: 83/51  Max: 138/106  HR 70 Pulse  Avg: 77.8  Min: 59  Max: 168  RR 16 Resp  Avg: 17  Min: 16  Max: 18  SaO2 100 %   SpO2  Avg: 98.4 %  Min: 97 %  Max: 100 %       24 Hour I/O Current Shift I/O  Time Ins Outs 10/07 0701 - 10/08 0700 In: 956 [I.V.:956] Out: 1750 [Urine:1400] No intake/output data recorded.  General: NAD Pulmonary: no increased work of breathing Abdomen: non-distended, non-tender, fundus firm at level of umbilicus Extremities: no edema, no erythema, no tenderness  Labs:   Recent Labs Lab 11/19/15 0836 11/20/15 0749 11/21/15 0531  WBC 15.8* 16.6* 16.9*  HGB 12.8 12.8 12.2  HCT 36.2 37.7 35.7  PLT 275 297 264      Assessment:   24 y.o. [id number] postpartum day # 1 status post SVD, doing well.  Desires permanent sterilization.  Plan:   1) Desires permanent sterility: reviewed alternative forms of contraception both permanent and non-permanent.  Reviewed the risks, benefits of each, including risk of failure of 3-5 in every 1,000 tubals each year.  She would like to proceed. I personally reviewed the risks of the surgery, including; bleeding, infection, damage to nearby organs such as bowel, bladder, nerves, and blood vessels.  Will proceed this AM when OR is ready.  2) A POS / Rubella  / Varicella Immune  3) TDAP status up to date  4) breast and formula feeding. Instructed to discuss with anesthesia regarding pumping and dumping breast milk after her surgery /Contraception = bilateral tubal ligation planned this AM  5) Disposition: home today or tomorrow.  Thomasene MohairStephen Broadus Costilla, MD 11/21/2015 10:18 AM

## 2015-11-21 NOTE — Clinical Social Work Note (Signed)
CSW received consult that patient needed to discuss resources for food and clothing. Patient was in surgery when CSW visited bedside. CSW placed resource list with chart and will attempt to revisit post-natal. Primary CSW will be alerted via handoff to situation. CSW following.  Argentina PonderKaren Martha Nikol Bowman, MSW, LCSW-A [phone]

## 2015-11-21 NOTE — Progress Notes (Signed)
Iv flushed by RN prior to leaving for OR, iv site wnl

## 2015-11-21 NOTE — OR Nursing (Signed)
Pillow across abd when patient coughs

## 2015-11-21 NOTE — Plan of Care (Signed)
Problem: Pain Managment: Goal: General experience of comfort will improve Outcome: Not Progressing Post op Pain req.'g Roxi and Dilaudid  Problem: Pain Management: Goal: General experience of comfort will improve and pain level will decrease Outcome: Not Progressing Post Op Tubal

## 2015-11-21 NOTE — Anesthesia Postprocedure Evaluation (Signed)
Anesthesia Post Note  Patient: [name]  Procedure(s) Performed: Procedure(s) (LRB): POST PARTUM TUBAL LIGATION (Bilateral)  Patient location during evaluation: PACU Anesthesia Type: General Level of consciousness: awake and alert and oriented Pain management: pain level controlled Vital Signs Assessment: post-procedure vital signs reviewed and stable Respiratory status: spontaneous breathing Cardiovascular status: blood pressure returned to baseline Anesthetic complications: no    Last Vitals:  Vitals:   11/21/15 1221 11/21/15 1307  BP: 121/70 124/81  Pulse: (!) 109 93  Resp: (!) 26 14  Temp: 36.7 C 36.8 C    Last Pain:  Vitals:   11/21/15 1307  TempSrc:   PainSc: 4                  Babbie Dondlinger

## 2015-11-21 NOTE — Anesthesia Postprocedure Evaluation (Signed)
Anesthesia Post Note  Patient: [name]  Procedure(s) Performed: * No procedures listed *  Patient location during evaluation: Mother Baby Anesthesia Type: Epidural Level of consciousness: awake and alert and oriented Pain management: pain level controlled Vital Signs Assessment: post-procedure vital signs reviewed and stable Respiratory status: spontaneous breathing Postop Assessment: no headache, no backache and patient able to bend at knees Anesthetic complications: no    Last Vitals:  Vitals:   11/21/15 1221 11/21/15 1307  BP: 121/70 124/81  Pulse: (!) 109 93  Resp: (!) 26 14  Temp: 36.7 C 36.8 C    Last Pain:  Vitals:   11/21/15 1307  TempSrc:   PainSc: 4                  Okey Zelek

## 2015-11-21 NOTE — Anesthesia Preprocedure Evaluation (Signed)
Anesthesia Evaluation  Patient identified by MRN, date of birth, ID band Patient awake    Reviewed: Allergy & Precautions, NPO status , Patient's Chart, lab work & pertinent test results  Airway Mallampati: II       Dental no notable dental hx.    Pulmonary asthma , Current Smoker,    Pulmonary exam normal        Cardiovascular hypertension, Normal cardiovascular exam     Neuro/Psych  Headaches, PSYCHIATRIC DISORDERS Anxiety Depression Bipolar Disorder    GI/Hepatic negative GI ROS, Neg liver ROS,   Endo/Other  negative endocrine ROS  Renal/GU negative Renal ROS  negative genitourinary   Musculoskeletal negative musculoskeletal ROS (+)   Abdominal Normal abdominal exam  (+)   Peds negative pediatric ROS (+)  Hematology negative hematology ROS (+)   Anesthesia Other Findings   Reproductive/Obstetrics (+) Pregnancy                             Anesthesia Physical Anesthesia Plan  ASA: II  Anesthesia Plan: Epidural   Post-op Pain Management:    Induction: Intravenous  Airway Management Planned: Natural Airway  Additional Equipment:   Intra-op Plan:   Post-operative Plan:   Informed Consent: I have reviewed the patients History and Physical, chart, labs and discussed the procedure including the risks, benefits and alternatives for the proposed anesthesia with the patient or authorized representative who has indicated his/her understanding and acceptance.   Dental advisory given  Plan Discussed with: CRNA and Surgeon  Anesthesia Plan Comments:         Anesthesia Quick Evaluation  

## 2015-11-22 ENCOUNTER — Encounter: Payer: Self-pay | Admitting: Obstetrics and Gynecology

## 2015-11-22 MED ORDER — OXYCODONE HCL 5 MG PO TABS: 5.0000 mg | ORAL_TABLET | ORAL | 0 refills | Status: DC | PRN

## 2015-11-22 NOTE — Progress Notes (Signed)
D/C order from MD.  Reviewed d/c instructions and prescriptions with patient and answered any questions.  Patient d/c home with infant via wheelchair by nursing/auxillary. 

## 2015-11-23 LAB — SURGICAL PATHOLOGY

## 2015-11-26 ENCOUNTER — Emergency Department
Admission: EM | Admit: 2015-11-26 | Discharge: 2015-11-26 | Disposition: A | Discharge status: Home / Self Care | Payer: Medicaid Other | Attending: Emergency Medicine | Admitting: Emergency Medicine

## 2015-11-26 ENCOUNTER — Emergency Department: Payer: Medicaid Other

## 2015-11-26 ENCOUNTER — Other Ambulatory Visit: Payer: Self-pay | Admitting: Certified Nurse Midwife

## 2015-11-26 ENCOUNTER — Encounter: Payer: Self-pay | Admitting: *Deleted

## 2015-11-26 ENCOUNTER — Ambulatory Visit: Payer: Medicaid Other

## 2015-11-26 DIAGNOSIS — Z79899 Other long term (current) drug therapy: Secondary | ICD-10-CM | POA: Insufficient documentation

## 2015-11-26 DIAGNOSIS — F17210 Nicotine dependence, cigarettes, uncomplicated: Secondary | ICD-10-CM | POA: Insufficient documentation

## 2015-11-26 DIAGNOSIS — I10 Essential (primary) hypertension: Secondary | ICD-10-CM | POA: Insufficient documentation

## 2015-11-26 DIAGNOSIS — O9989 Other specified diseases and conditions complicating pregnancy, childbirth and the puerperium: Secondary | ICD-10-CM | POA: Diagnosis not present

## 2015-11-26 DIAGNOSIS — R1031 Right lower quadrant pain: Secondary | ICD-10-CM | POA: Diagnosis not present

## 2015-11-26 DIAGNOSIS — R1030 Lower abdominal pain, unspecified: Secondary | ICD-10-CM

## 2015-11-26 DIAGNOSIS — Z9851 Tubal ligation status: Secondary | ICD-10-CM

## 2015-11-26 DIAGNOSIS — F909 Attention-deficit hyperactivity disorder, unspecified type: Secondary | ICD-10-CM | POA: Insufficient documentation

## 2015-11-26 LAB — CBC
HCT: 41.7 % (ref 35.0–47.0)
Hemoglobin: 13.9 g/dL (ref 12.0–16.0)
MCH: 30.7 pg (ref 26.0–34.0)
MCHC: 33.3 g/dL (ref 32.0–36.0)
MCV: 92.3 fL (ref 80.0–100.0)
PLATELETS: 387 10*3/uL (ref 150–440)
RBC: 4.52 MIL/uL (ref 3.80–5.20)
RDW: 13.5 % (ref 11.5–14.5)
WBC: 13.5 10*3/uL — ABNORMAL HIGH (ref 3.6–11.0)

## 2015-11-26 LAB — URINALYSIS COMPLETE WITH MICROSCOPIC (ARMC ONLY)
BILIRUBIN URINE: NEGATIVE
GLUCOSE, UA: NEGATIVE mg/dL
Ketones, ur: NEGATIVE mg/dL
Nitrite: NEGATIVE
PH: 5 (ref 5.0–8.0)
Protein, ur: 30 mg/dL — AB
Specific Gravity, Urine: 1.023 (ref 1.005–1.030)

## 2015-11-26 LAB — COMPREHENSIVE METABOLIC PANEL
ALT: 21 U/L (ref 14–54)
AST: 20 U/L (ref 15–41)
Albumin: 3.4 g/dL — ABNORMAL LOW (ref 3.5–5.0)
Alkaline Phosphatase: 91 U/L (ref 38–126)
Anion gap: 8 (ref 5–15)
BUN: 17 mg/dL (ref 6–20)
CHLORIDE: 104 mmol/L (ref 101–111)
CO2: 26 mmol/L (ref 22–32)
CREATININE: 0.74 mg/dL (ref 0.44–1.00)
Calcium: 9.5 mg/dL (ref 8.9–10.3)
GFR calc Af Amer: >60 mL/min (ref >60)
GFR calc non Af Amer: >60 mL/min (ref >60)
Glucose, Bld: 97 mg/dL (ref 65–99)
Potassium: 4 mmol/L (ref 3.5–5.1)
SODIUM: 138 mmol/L (ref 135–145)
Total Bilirubin: <0.1 mg/dL — ABNORMAL LOW (ref 0.3–1.2)
Total Protein: 7.1 g/dL (ref 6.5–8.1)

## 2015-11-26 LAB — LIPASE, BLOOD: LIPASE: 20 U/L (ref 11–51)

## 2015-11-26 MED ORDER — TRAMADOL HCL 50 MG PO TABS: 50.0000 mg | ORAL_TABLET | Freq: Four times a day (QID) | ORAL | 0 refills | Status: DC | PRN

## 2015-11-26 MED ORDER — KETOROLAC TROMETHAMINE 30 MG/ML IJ SOLN
30.0000 mg | Freq: Once | INTRAMUSCULAR | Status: AC
  Administered 2015-11-26: 30 mg via INTRAMUSCULAR
  Filled 2015-11-26: qty 1

## 2015-11-26 NOTE — Discharge Instructions (Signed)
Please return immediately if condition worsens. Please contact her primary physician or the physician you were given for referral. If you have any specialist physicians involved in her treatment and plan please also contact them. Thank you for using [facility]. ° °

## 2015-11-26 NOTE — ED Notes (Signed)
Pt alert and oriented X4, active, cooperative, pt in NAD. RR even and unlabored, color WNL.  Pt informed to return if any life threatening symptoms occur.   

## 2015-11-26 NOTE — ED Notes (Signed)
MD at bedside. 

## 2015-11-26 NOTE — ED Notes (Signed)
Patient transported to CT 

## 2015-11-26 NOTE — ED Triage Notes (Signed)
Pt to triage via wheelchair.  Pt is pp 6 days.  Today pt has right side abd pain after vaginal delivery and BTL.  Pt delivered at [facility] on 11/20/15 and had BTL on 11/21/15.  Pt alert.

## 2015-11-26 NOTE — ED Notes (Signed)
Pt back from CT

## 2015-11-26 NOTE — ED Notes (Signed)
Right sided abdominal pain and tenderness since having her tubes tied X 5 days ago. Pt surgical scar WDL. Pt states that she was sent here for possible CT scan. Vaginal delivery on 11/21/15. Pt alert and oriented X4, active, cooperative, pt in NAD. RR even and unlabored, color WNL.

## 2015-11-27 NOTE — ED Provider Notes (Signed)
Time Seen: Approximately 2130  I have reviewed the triage notes  Chief Complaint: Abdominal Pain   History of Present Illness: [name] is a 25 y.o. female who is 6 days postpartum who had a normal vaginal delivery. She states that she had minimal labor and did not require any episiotomy. Patient still has some mild vaginal bleeding though she states it seems to be decreasing. She was actually at [redacted] weeks gestation. Patient also received a tubal ligation. She was seen and evaluated as a palpation at the OB/GYN office and there was discussion about possible? Pancreatitis. Patient describes some mild right lower quadrant abdominal discomfort. She denies any fever or heavy vaginal bleeding. She denies any abnormal vaginal discharge.   Past Medical History:  Diagnosis Date  . ADD (attention deficit disorder)   . Anxiety   . Bipolar 1 disorder (HCC)   . Depression   . HA (headache)   . Hypertension   . PTSD (post-traumatic stress disorder)   . Status post tubal ligation 11/21/2015   Postpartum    Patient Active Problem List   Diagnosis Date Noted  . Status post tubal ligation 11/21/2015  . Postpartum care following vaginal delivery 11/20/2015  . Post term pregnancy at [redacted] weeks gestation 11/19/2015  . PTSD (post-traumatic stress disorder)   . Depression   . Bipolar 1 disorder (HCC)   . Major depression 02/16/2013  . Alcohol abuse 02/16/2013  . Anxiety state, unspecified 02/16/2013    Past Surgical History:  Procedure Laterality Date  . MOUTH SURGERY    . TUBAL LIGATION Bilateral 11/21/2015   Procedure: POST PARTUM TUBAL LIGATION;  Surgeon: Conard Novak, MD;  Location: [facility] ORS;  Service: Gynecology;  Laterality: Bilateral;    Past Surgical History:  Procedure Laterality Date  . MOUTH SURGERY    . TUBAL LIGATION Bilateral 11/21/2015   Procedure: POST PARTUM TUBAL LIGATION;  Surgeon: Conard Novak, MD;  Location: [facility] ORS;  Service: Gynecology;  Laterality:  Bilateral;    Current Outpatient Rx  . Order #: [id number] Class: Print  . Order #: [id number] Class: Print  . Order #: [id number] Class: Historical Med  . Order #: [id number] Class: Print    Allergies:  Review of patient's allergies indicates no known allergies.  Family History: Family History  Problem Relation Age of Onset  . Alcohol abuse Mother   . Depression Mother   . Alcohol abuse Father   . Diabetes Father     Social History: Social History  Substance Use Topics  . Smoking status: Current Every Day Smoker    Packs/day: 0.50    Types: Cigarettes  . Smokeless tobacco: Never Used  . Alcohol use No     Comment: Patient endorses drinking occassionally. Amount unspecified      Review of Systems:   10 point review of systems was performed and was otherwise negative:  Constitutional: No fever Eyes: No visual disturbances ENT: No sore throat, ear pain Cardiac: No chest pain Respiratory: No shortness of breath, wheezing, or stridor Abdomen: She points primarily to the lower right quadrant without nausea, vomiting or diarrhea. Endocrine: No weight loss, No night sweats Extremities: No peripheral edema, cyanosis Skin: No rashes, easy bruising Neurologic: No focal weakness, trouble with speech or swollowing Urologic: No dysuria, some hematuria secondary to vaginal bleeding   Physical Exam:  ED Triage Vitals  Enc Vitals Group     BP 11/26/15 1959 (!) 103/59     Pulse Rate 11/26/15 1959 80  Resp 11/26/15 1959 20     Temp 11/26/15 1959 98.7 F (37.1 C)     Temp Source 11/26/15 1959 Oral     SpO2 11/26/15 1959 99 %     Weight 11/26/15 2001 165 lb (74.8 kg)     Height 11/26/15 2001 5\' 3"  (1.6 m)     Head Circumference --      Peak Flow --      Pain Score 11/26/15 2001 10     Pain Loc --      Pain Edu? --      Excl. in GC? --     General: Awake , Alert , and Oriented times 3; GCS 15 Head: Normal cephalic , atraumatic Eyes: Pupils equal , round, reactive to  light Nose/Throat: No nasal drainage, patent upper airway without erythema or exudate.  Neck: Supple, Full range of motion, No anterior adenopathy or palpable thyroid masses Lungs: Clear to ascultation without wheezes , rhonchi, or rales Heart: Regular rate, regular rhythm without murmurs , gallops , or rubs Abdomen: Mild tenderness right lower quadrant without rebound, guarding , or rigidity; bowel sounds positive and symmetric in all 4 quadrants. No organomegaly .        Extremities: 2 plus symmetric pulses. No edema, clubbing or cyanosis Neurologic: normal ambulation, Motor symmetric without deficits, sensory intact Skin: warm, dry, no rashes   Labs:   All laboratory work was reviewed including any pertinent negatives or positives listed below:  Labs Reviewed  COMPREHENSIVE METABOLIC PANEL - Abnormal; Notable for the following:       Result Value   Albumin 3.4 (*)    Total Bilirubin <0.1 (*)    All other components within normal limits  CBC - Abnormal; Notable for the following:    WBC 13.5 (*)    All other components within normal limits  URINALYSIS COMPLETEWITH MICROSCOPIC ([facility] ONLY) - Abnormal; Notable for the following:    Color, Urine YELLOW (*)    APPearance CLEAR (*)    Hgb urine dipstick 3+ (*)    Protein, ur 30 (*)    Leukocytes, UA 2+ (*)    Bacteria, UA RARE (*)    Squamous Epithelial / LPF 0-5 (*)    All other components within normal limits  URINE CULTURE  LIPASE, BLOOD  Laboratory work appears to be within normal limits given the patient's post partum state.   Radiology:  "Ct Renal Stone Study  Result Date: 11/26/2015 CLINICAL DATA:  Vaginal delivery 11/20/2015. Bilateral tubal ligation 11/21/2015. Lower abdominal pain. EXAM: CT ABDOMEN AND PELVIS WITHOUT CONTRAST TECHNIQUE: Multidetector CT imaging of the abdomen and pelvis was performed following the standard protocol without IV contrast. COMPARISON:  None. FINDINGS: Lower chest: Lung bases clear.  Hepatobiliary: Liver is normal. Contracted gallbladder. Bile ducts nondilated. Pancreas: Negative Spleen: Negative Adrenals/Urinary Tract: 2 mm nonobstructing stone left lower pole. No other renal calculi. No renal obstruction or mass. Urinary bladder empty. Stomach/Bowel: Negative for bowel obstruction. No bowel mass or edema. Normal appendix. Vascular/Lymphatic: Negative Reproductive: Enlarged uterus due to recent delivery. High-density within the uterine cavity compatible with blood from recent delivery. Other: No free fluid. Gas in the soft tissues around the umbilicus from recent tubal ligation procedure. No hematoma. Musculoskeletal: Negative IMPRESSION: 2 mm nonobstructing stone left lower pole Postsurgical changes below the umbilicus from recent tubal ligation procedure. No hematoma or free fluid. Enlarged uterus due to recent delivery. Electronically Signed   By: Marlan Palau M.D.   On: 11/26/2015  21:29  "  I personally reviewed the radiologic studies    ED Course:  Patient's stay here was uneventful and patient did not appear to have any surgical findings or obvious postoperative issues. Does not appear to be any endometriosis or signs of significant vaginal bleeding at this time. Not sure the patient's nature of her discomfort at this time. The patient was given 30 mg IM Toradol was sleeping comfortably and repeat exam of her abdomen again shows no peritoneal findings. I reviewed the case briefly with Dr. Jean RosenthalJackson who is her OB/GYN that performed the tubal ligation. We agree with outpatient management at this time and I prescribed her Ultram and recommended bed rest and drink plenty of fluids and return here for fever, increased pain, abnormal vaginal discharge or bleeding or any other new concerns. Clinical Course      Final Clinical Impression: * Postpartum Status post tubal ligation Final diagnoses:  Lower abdominal pain  Right lower quadrant abdominal pain     Plan:   Outpatient Patient was advised to return immediately if condition worsens. Patient was advised to follow up with their primary care physician or other specialized physicians involved in their outpatient care. The patient and/or family member/power of attorney had laboratory results reviewed at the bedside. All questions and concerns were addressed and appropriate discharge instructions were distributed by the nursing staff.             Jennye MoccasinBrian S Quigley, MD 11/27/15 [phone]

## 2015-11-28 LAB — URINE CULTURE

## 2015-12-14 IMAGING — CR DG CHEST 2V
2 series · 2 of 2 positions shown · non-contrast
Comparison: 12/01/2012

CLINICAL DATA: Wheezing, cough, shortness of breath. Patient is
pregnant and was shielded.

EXAM:
CHEST  2 VIEW

[w chest pa]
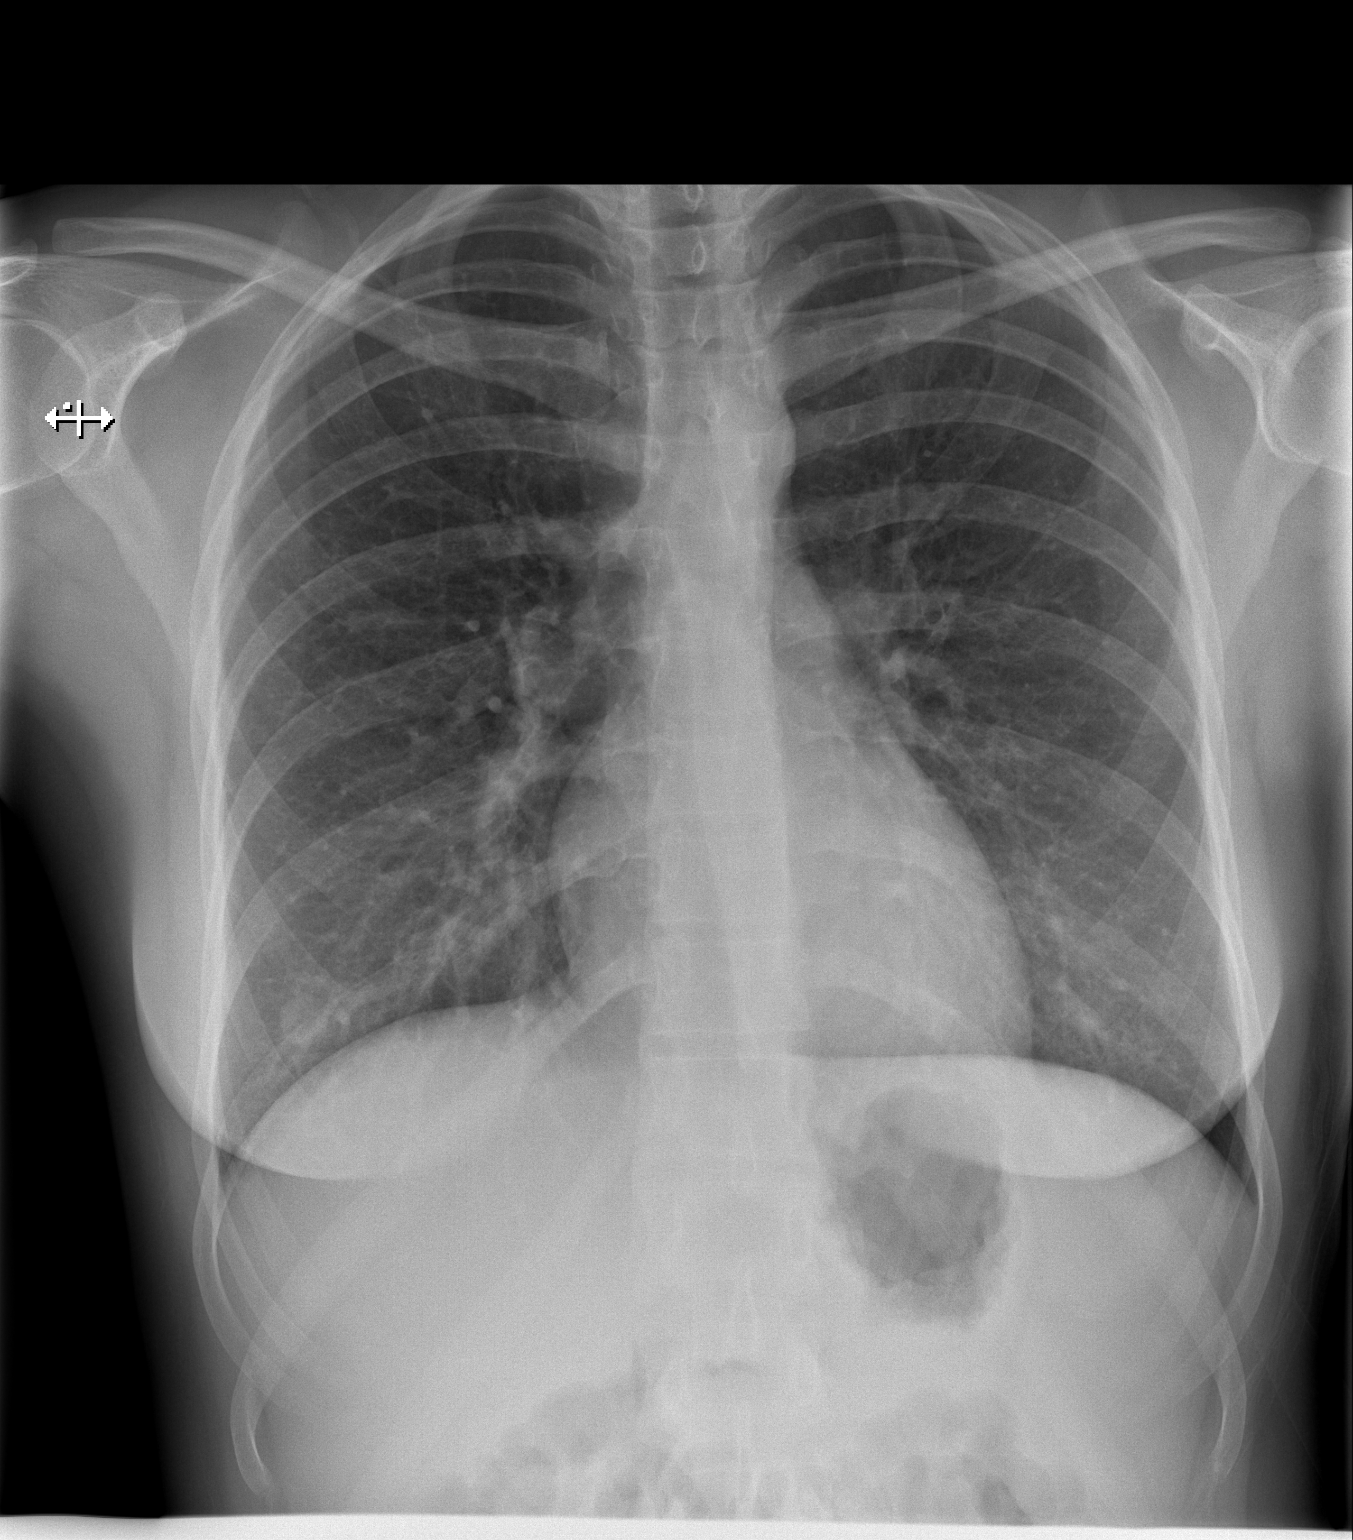

[w chest lat]
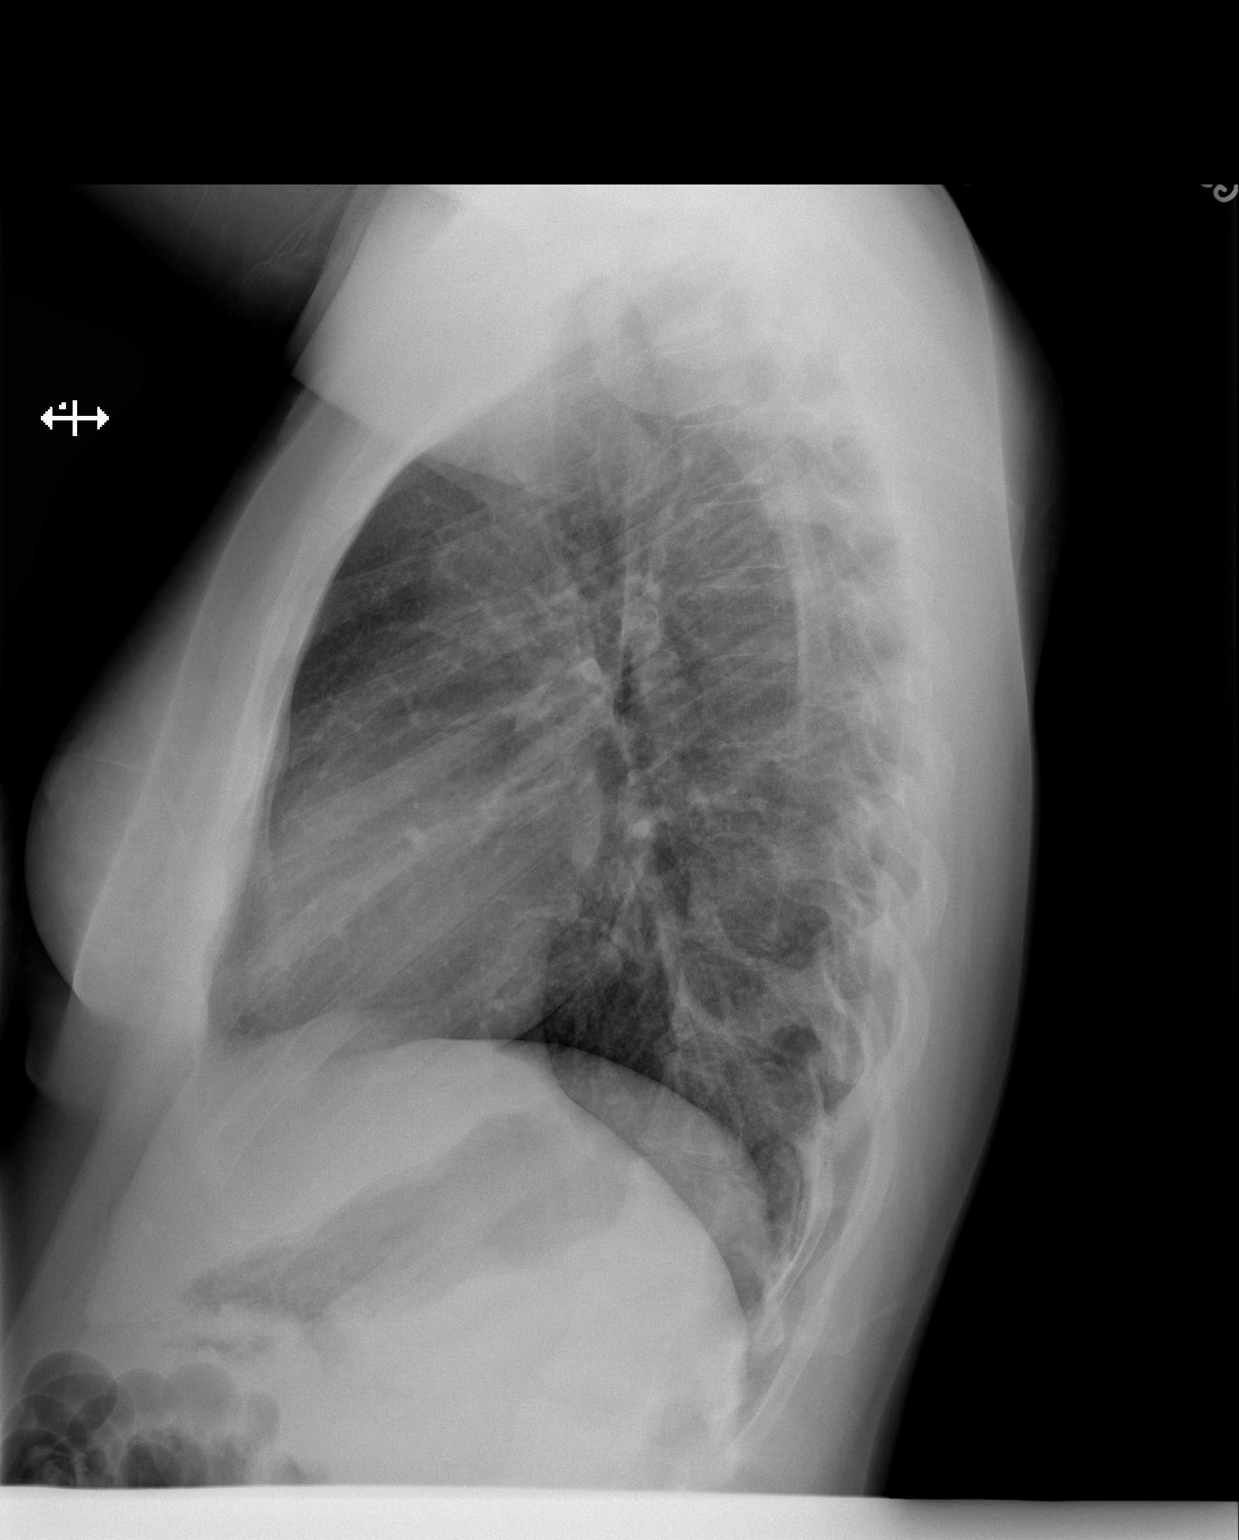

[2 of 2 positions shown; findings below may reference images not displayed]

FINDINGS: The heart size and mediastinal contours are within normal limits.
Both lungs are clear. The visualized skeletal structures are
unremarkable. Nodular opacities over the lung bases consistent with
prominent nipple shadows.
IMPRESSION: No active cardiopulmonary disease.

## 2016-09-13 ENCOUNTER — Ambulatory Visit (HOSPITAL_COMMUNITY)
Admission: EM | Admit: 2016-09-13 | Discharge: 2016-09-13 | Disposition: A | Discharge status: Nursing Home (Includes ICF) | Payer: No Typology Code available for payment source

## 2016-09-13 ENCOUNTER — Encounter (HOSPITAL_COMMUNITY): Payer: Self-pay | Admitting: *Deleted

## 2016-09-13 ENCOUNTER — Inpatient Hospital Stay (HOSPITAL_COMMUNITY)
Admission: AD | Admit: 2016-09-13 | Discharge: 2016-09-13 | Disposition: A | Discharge status: Home / Self Care | Payer: Medicaid Other | Source: Ambulatory Visit | Attending: Family Medicine | Admitting: Family Medicine

## 2016-09-13 DIAGNOSIS — N926 Irregular menstruation, unspecified: Secondary | ICD-10-CM | POA: Diagnosis not present

## 2016-09-13 DIAGNOSIS — R1033 Periumbilical pain: Secondary | ICD-10-CM | POA: Diagnosis not present

## 2016-09-13 DIAGNOSIS — Z3202 Encounter for pregnancy test, result negative: Secondary | ICD-10-CM | POA: Diagnosis not present

## 2016-09-13 DIAGNOSIS — F17210 Nicotine dependence, cigarettes, uncomplicated: Secondary | ICD-10-CM | POA: Diagnosis not present

## 2016-09-13 DIAGNOSIS — Z9851 Tubal ligation status: Secondary | ICD-10-CM | POA: Diagnosis not present

## 2016-09-13 DIAGNOSIS — R109 Unspecified abdominal pain: Secondary | ICD-10-CM | POA: Insufficient documentation

## 2016-09-13 LAB — HCG, SERUM, QUALITATIVE: PREG SERUM: NEGATIVE

## 2016-09-13 LAB — URINALYSIS, ROUTINE W REFLEX MICROSCOPIC
BILIRUBIN URINE: NEGATIVE
Glucose, UA: NEGATIVE mg/dL
Hgb urine dipstick: NEGATIVE
KETONES UR: NEGATIVE mg/dL
Nitrite: NEGATIVE
PH: 8 (ref 5.0–8.0)
Protein, ur: NEGATIVE mg/dL
Specific Gravity, Urine: 1.017 (ref 1.005–1.030)

## 2016-09-13 LAB — POCT PREGNANCY, URINE: PREG TEST UR: NEGATIVE

## 2016-09-13 NOTE — Discharge Instructions (Signed)

## 2016-09-13 NOTE — MAU Note (Signed)
Pain at belly button. Started about 2 wks ago.  +HPT 4 days.  Having a lot of bloating,cravings.  Has had a tubal ligation

## 2016-09-13 NOTE — MAU Provider Note (Signed)
History     CSN: [id number]  Arrival date and time: 09/13/16 1433   First Provider Initiated Contact with Patient 09/13/16 1647      Chief Complaint  Patient presents with  . Abdominal Pain  . Possible Pregnancy   HPI Ms. [name] is a 26 y.o. [id number] who presents to MAU today with complaint of lower abdominal bloating and occasional sharp pains. She states LMP 07/28/16 and usually regular. She denies fever, N/V/D or UTI symptoms. She states +HPT and has had BTL in the past.   OB History    Gravida Para Term Preterm AB Living   5 4 4   1 4    SAB TAB Ectopic Multiple Live Births   1     0 4      Past Medical History:  Diagnosis Date  . ADD (attention deficit disorder)   . Anxiety   . Bipolar 1 disorder (HCC)   . Depression   . HA (headache)   . Hypertension   . PTSD (post-traumatic stress disorder)   . Status post tubal ligation 11/21/2015   Postpartum    Past Surgical History:  Procedure Laterality Date  . MOUTH SURGERY    . TUBAL LIGATION Bilateral 11/21/2015   Procedure: POST PARTUM TUBAL LIGATION;  Surgeon: Conard NovakStephen D Jackson, MD;  Location: [facility] OR[facility];  Service: Gynecology;  Laterality: Bilateral;    Family History  Problem Relation Age of Onset  . Alcohol abuse Mother   . Depression Mother   . Alcohol abuse Father   . Diabetes Father     Social History  Substance Use Topics  . Smoking status: Current Every Day Smoker    Packs/day: 0.50    Types: Cigarettes  . Smokeless tobacco: Never Used  . Alcohol use No     Comment: Patient endorses drinking occassionally. Amount unspecified     Allergies: No Known Allergies  No prescriptions prior to admission.    Review of Systems  Constitutional: Negative for fever.  Gastrointestinal: Positive for abdominal pain. Negative for constipation, diarrhea, nausea and vomiting.  Genitourinary: Positive for pelvic pain. Negative for dysuria, frequency, urgency, vaginal bleeding and vaginal discharge.    Physical Exam   Blood pressure 113/65, pulse 63, temperature 99 F (37.2 C), temperature source Oral, resp. rate 16, weight 128 lb 8 oz (58.3 kg), last menstrual period 07/28/2016, SpO2 100 %, currently breastfeeding.  Physical Exam  Nursing note and vitals reviewed. Constitutional: She is oriented to person, place, and time. She appears well-developed and well-nourished. No distress.  HENT:  Head: Normocephalic and atraumatic.  Cardiovascular: Normal rate.   Respiratory: Effort normal.  GI: Soft. She exhibits no distension and no mass. There is tenderness (very mild tenderness to palpation of the left lateral periumbilical region). There is no rebound and no guarding.  Neurological: She is alert and oriented to person, place, and time.  Skin: Skin is warm and dry. No erythema.  Psychiatric: She has a normal mood and affect.    Results for orders placed or performed during the hospital encounter of 09/13/16 (from the past 24 hour(s))  Urinalysis, Routine w reflex microscopic     Status: Abnormal   Collection Time: 09/13/16  2:54 PM  Result Value Ref Range   Color, Urine YELLOW YELLOW   APPearance CLOUDY (A) CLEAR   Specific Gravity, Urine 1.017 1.005 - 1.030   pH 8.0 5.0 - 8.0   Glucose, UA NEGATIVE NEGATIVE mg/dL   Hgb urine dipstick NEGATIVE  NEGATIVE   Bilirubin Urine NEGATIVE NEGATIVE   Ketones, ur NEGATIVE NEGATIVE mg/dL   Protein, ur NEGATIVE NEGATIVE mg/dL   Nitrite NEGATIVE NEGATIVE   Leukocytes, UA TRACE (A) NEGATIVE   RBC / HPF 0-5 0 - 5 RBC/hpf   WBC, UA 0-5 0 - 5 WBC/hpf   Bacteria, UA FEW (A) NONE SEEN   Squamous Epithelial / LPF 0-5 (A) NONE SEEN   Mucous PRESENT    Amorphous Crystal PRESENT   Pregnancy, urine POC     Status: None   Collection Time: 09/13/16  3:11 PM  Result Value Ref Range   Preg Test, Ur NEGATIVE NEGATIVE  hCG, serum, qualitative     Status: None   Collection Time: 09/13/16  3:43 PM  Result Value Ref Range   Preg, Serum NEGATIVE  NEGATIVE    MAU Course  Procedures None  MDM UPT - negative Qualitative hCG drawn - negative UA today  Urine culture ordered   Assessment and Plan  A: Abdominal pain Negative pregnancy test Irregular period Abdominal pain, MSK   P: Discharge home Ibuprofen PRN for pain Moderation of activity advised Patient advised to follow-up with PCP of choice Patient may return to MAU as needed or if her condition were to change or worsen   Vonzella NippleJulie Wenzel, PA-C 09/13/2016, 8:08 PM

## 2016-10-26 ENCOUNTER — Encounter (HOSPITAL_COMMUNITY): Payer: Self-pay | Admitting: Emergency Medicine

## 2016-10-26 DIAGNOSIS — Y999 Unspecified external cause status: Secondary | ICD-10-CM | POA: Diagnosis not present

## 2016-10-26 DIAGNOSIS — S1010XA Unspecified superficial injuries of throat, initial encounter: Secondary | ICD-10-CM | POA: Diagnosis not present

## 2016-10-26 DIAGNOSIS — Z5321 Procedure and treatment not carried out due to patient leaving prior to being seen by health care provider: Secondary | ICD-10-CM | POA: Insufficient documentation

## 2016-10-26 DIAGNOSIS — Y929 Unspecified place or not applicable: Secondary | ICD-10-CM | POA: Insufficient documentation

## 2016-10-26 DIAGNOSIS — Y939 Activity, unspecified: Secondary | ICD-10-CM | POA: Diagnosis not present
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
## 2016-10-26 NOTE — ED Notes (Signed)
Per Dr. Jeraldine LootsLockwood no orders at this time.

## 2016-10-27 ENCOUNTER — Emergency Department (HOSPITAL_COMMUNITY)
Admission: EM | Admit: 2016-10-27 | Discharge: 2016-10-27 | Disposition: A | Discharge status: Home / Self Care | Payer: Medicaid Other | Attending: Emergency Medicine | Admitting: Emergency Medicine

## 2016-12-06 ENCOUNTER — Emergency Department (HOSPITAL_COMMUNITY)
Admission: EM | Admit: 2016-12-06 | Discharge: 2016-12-06 | Disposition: A | Discharge status: Home / Self Care | Payer: Medicaid Other | Attending: Emergency Medicine | Admitting: Emergency Medicine

## 2016-12-06 ENCOUNTER — Encounter (HOSPITAL_COMMUNITY): Payer: Self-pay | Admitting: Emergency Medicine

## 2016-12-06 DIAGNOSIS — F1110 Opioid abuse, uncomplicated: Secondary | ICD-10-CM | POA: Insufficient documentation

## 2016-12-06 DIAGNOSIS — I10 Essential (primary) hypertension: Secondary | ICD-10-CM | POA: Insufficient documentation

## 2016-12-06 DIAGNOSIS — F17210 Nicotine dependence, cigarettes, uncomplicated: Secondary | ICD-10-CM | POA: Insufficient documentation

## 2016-12-06 DIAGNOSIS — Z79899 Other long term (current) drug therapy: Secondary | ICD-10-CM | POA: Diagnosis not present

## 2016-12-06 DIAGNOSIS — R42 Dizziness and giddiness: Secondary | ICD-10-CM | POA: Diagnosis present

## 2016-12-06 LAB — I-STAT BETA HCG BLOOD, ED (MC, WL, AP ONLY)

## 2016-12-06 MED ORDER — PROMETHAZINE HCL 25 MG PO TABS: 25.0000 mg | ORAL_TABLET | Freq: Four times a day (QID) | ORAL | 0 refills | Status: DC | PRN

## 2016-12-06 MED ORDER — CLONIDINE HCL 0.1 MG PO TABS: 0.1000 mg | ORAL_TABLET | Freq: Three times a day (TID) | ORAL | 0 refills | Status: DC | PRN

## 2016-12-06 MED ORDER — ZOLPIDEM TARTRATE 5 MG PO TABS: 5.0000 mg | ORAL_TABLET | Freq: Every evening | ORAL | 0 refills | Status: DC | PRN
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
## 2016-12-06 NOTE — ED Triage Notes (Signed)
Pt complaint of dizziness, hot/could, nausea for a few hours related to heroin use; last use 6-8 hours ago.

## 2016-12-06 NOTE — ED Provider Notes (Signed)
[facility]-EMERGENCY DEPT Provider Note   CSN: [id number] Arrival date & time: 12/06/16  0911     History   Chief Complaint Chief Complaint  Patient presents with  . Withdrawal    HPI [name] is a 26 y.o. female.  HPI Patient presents to the emergency department requesting assistance with heroin withdrawal.  She states her last use was 6-8 hours ago.  She states she is having hot flashes and feels dizzy and tired.  She denies fevers and chills.  No syncope.  No preceding chest pain or palpitations.  She is requesting inpatient detox for opioid abuse and withdrawal.  She states she also injects crystal meth.  No homicidal or suicidal ideation.    Past Medical History:  Diagnosis Date  . ADD (attention deficit disorder)   . Anxiety   . Bipolar 1 disorder (HCC)   . Depression   . HA (headache)   . Hypertension   . PTSD (post-traumatic stress disorder)   . Status post tubal ligation 11/21/2015   Postpartum    Patient Active Problem List   Diagnosis Date Noted  . Status post tubal ligation 11/21/2015  . Postpartum care following vaginal delivery 11/20/2015  . Post term pregnancy at [redacted] weeks gestation 11/19/2015  . PTSD (post-traumatic stress disorder)   . Depression   . Bipolar 1 disorder (HCC)   . Major depression 02/16/2013  . Alcohol abuse 02/16/2013  . Anxiety state, unspecified 02/16/2013    Past Surgical History:  Procedure Laterality Date  . MOUTH SURGERY    . TUBAL LIGATION Bilateral 11/21/2015   Procedure: POST PARTUM TUBAL LIGATION;  Surgeon: Conard NovakStephen D Jackson, MD;  Location: [facility] ORS;  Service: Gynecology;  Laterality: Bilateral;    OB History    Gravida Para Term Preterm AB Living   5 4 4   1 4    SAB TAB Ectopic Multiple Live Births   1     0 4       Home Medications    Prior to Admission medications   Medication Sig Start Date End Date Taking? Authorizing Provider  albuterol (PROVENTIL HFA;VENTOLIN HFA) 108 (90  Base) MCG/ACT inhaler Inhale 2 puffs into the lungs every 6 (six) hours as needed for wheezing or shortness of breath. 03/01/15   Beers, Charmayne Sheerharles M, PA-C  cloNIDine (CATAPRES) 0.1 MG tablet Take 1 tablet (0.1 mg total) by mouth every 8 (eight) hours as needed (withdrawl symptoms). 12/06/16   Azalia Bilisampos, Caeden Foots, MD  Prenatal Vit-Fe Fumarate-FA (PRENATAL MULTIVITAMIN) TABS tablet Take 1 tablet by mouth daily at 12 noon.    [provider]  promethazine (PHENERGAN) 25 MG tablet Take 1 tablet (25 mg total) by mouth every 6 (six) hours as needed for nausea or vomiting. 12/06/16   Azalia Bilisampos, Tyreese Thain, MD  zolpidem (AMBIEN) 5 MG tablet Take 1 tablet (5 mg total) by mouth at bedtime as needed for sleep. 12/06/16   Azalia Bilisampos, Axxel Gude, MD    Family History Family History  Problem Relation Age of Onset  . Alcohol abuse Mother   . Depression Mother   . Alcohol abuse Father   . Diabetes Father     Social History Social History  Substance Use Topics  . Smoking status: Current Every Day Smoker    Packs/day: 0.50    Types: Cigarettes  . Smokeless tobacco: Never Used  . Alcohol use No     Comment: Patient endorses drinking occassionally. Amount unspecified      Allergies  Patient has no known allergies.   Review of Systems Review of Systems  All other systems reviewed and are negative.    Physical Exam Updated Vital Signs BP 96/79 (BP Location: Left Arm)   Pulse 91   Temp 97.6 F (36.4 C) (Oral)   Resp 16   Ht 5\' 3"  (1.6 m)   Wt 54.4 kg (120 lb)   SpO2 94%   BMI 21.26 kg/m   Physical Exam  Constitutional: She is oriented to person, place, and time. She appears well-developed and well-nourished.  HENT:  Head: Normocephalic.  Eyes: EOM are normal.  Neck: Normal range of motion.  Cardiovascular: Normal rate.   Pulmonary/Chest: Effort normal.  Abdominal: She exhibits no distension.  Musculoskeletal: Normal range of motion.  Neurological: She is alert and oriented to person, place, and  time.  Skin: Skin is warm.  Psychiatric: She has a normal mood and affect.  Nursing note and vitals reviewed.    ED Treatments / Results  Labs (all labs ordered are listed, but only abnormal results are displayed)   EKG  EKG Interpretation None       Radiology No results found.  Procedures Procedures (including critical care time)  Medications Ordered in ED Medications - No data to display   Initial Impression / Assessment and Plan / ED Course  I have reviewed the triage vital signs and the nursing notes.  Pertinent labs & imaging results that were available during my care of the patient were reviewed by me and considered in my medical decision making (see chart for details).     The patient presents today with narcotic abuse.  There is no indication for involuntary commitment for inpatient treatment.  I think the patient is best managed as an outpatient for his/her opioid abuse.  The patient will be discharged home with a prescription for clonidine, Ambien, and antiemitics.    Final Clinical Impressions(s) / ED Diagnoses   Final diagnoses:  Heroin abuse (HCC)    New Prescriptions New Prescriptions   CLONIDINE (CATAPRES) 0.1 MG TABLET    Take 1 tablet (0.1 mg total) by mouth every 8 (eight) hours as needed (withdrawl symptoms).   PROMETHAZINE (PHENERGAN) 25 MG TABLET    Take 1 tablet (25 mg total) by mouth every 6 (six) hours as needed for nausea or vomiting.   ZOLPIDEM (AMBIEN) 5 MG TABLET    Take 1 tablet (5 mg total) by mouth at bedtime as needed for sleep.     Azalia Bilis, MD 12/06/16 1003

## 2016-12-13 ENCOUNTER — Emergency Department (HOSPITAL_COMMUNITY)
Admission: EM | Admit: 2016-12-13 | Discharge: 2016-12-14 | Disposition: A | Discharge status: Other Acute Inpatient Hospital | Payer: Medicaid Other | Attending: Emergency Medicine | Admitting: Emergency Medicine

## 2016-12-13 ENCOUNTER — Encounter (HOSPITAL_COMMUNITY): Payer: Self-pay | Admitting: Emergency Medicine

## 2016-12-13 DIAGNOSIS — F319 Bipolar disorder, unspecified: Secondary | ICD-10-CM | POA: Insufficient documentation

## 2016-12-13 DIAGNOSIS — F17210 Nicotine dependence, cigarettes, uncomplicated: Secondary | ICD-10-CM | POA: Diagnosis not present

## 2016-12-13 DIAGNOSIS — F332 Major depressive disorder, recurrent severe without psychotic features: Secondary | ICD-10-CM | POA: Diagnosis not present

## 2016-12-13 DIAGNOSIS — R000 Tachycardia, unspecified: Secondary | ICD-10-CM | POA: Diagnosis not present

## 2016-12-13 DIAGNOSIS — F909 Attention-deficit hyperactivity disorder, unspecified type: Secondary | ICD-10-CM | POA: Insufficient documentation

## 2016-12-13 DIAGNOSIS — I10 Essential (primary) hypertension: Secondary | ICD-10-CM | POA: Insufficient documentation

## 2016-12-13 DIAGNOSIS — F1110 Opioid abuse, uncomplicated: Secondary | ICD-10-CM | POA: Insufficient documentation

## 2016-12-13 DIAGNOSIS — R45851 Suicidal ideations: Secondary | ICD-10-CM | POA: Diagnosis not present

## 2016-12-13 DIAGNOSIS — F1910 Other psychoactive substance abuse, uncomplicated: Secondary | ICD-10-CM

## 2016-12-13 DIAGNOSIS — F419 Anxiety disorder, unspecified: Secondary | ICD-10-CM | POA: Diagnosis present

## 2016-12-13 LAB — CBC
HCT: 45.6 % (ref 36.0–46.0)
Hemoglobin: 15.4 g/dL — ABNORMAL HIGH (ref 12.0–15.0)
MCH: 31.5 pg (ref 26.0–34.0)
MCHC: 33.8 g/dL (ref 30.0–36.0)
MCV: 93.3 fL (ref 78.0–100.0)
PLATELETS: 218 10*3/uL (ref 150–400)
RBC: 4.89 MIL/uL (ref 3.87–5.11)
RDW: 12.5 % (ref 11.5–15.5)
WBC: 8.5 10*3/uL (ref 4.0–10.5)

## 2016-12-13 LAB — COMPREHENSIVE METABOLIC PANEL
ALT: 120 U/L — ABNORMAL HIGH (ref 14–54)
ANION GAP: 13 (ref 5–15)
AST: 79 U/L — ABNORMAL HIGH (ref 15–41)
Albumin: 4.2 g/dL (ref 3.5–5.0)
Alkaline Phosphatase: 60 U/L (ref 38–126)
BILIRUBIN TOTAL: 1.1 mg/dL (ref 0.3–1.2)
BUN: 10 mg/dL (ref 6–20)
CO2: 26 mmol/L (ref 22–32)
Calcium: 9.8 mg/dL (ref 8.9–10.3)
Chloride: 102 mmol/L (ref 101–111)
Creatinine, Ser: 0.88 mg/dL (ref 0.44–1.00)
Glucose, Bld: 84 mg/dL (ref 65–99)
POTASSIUM: 3.6 mmol/L (ref 3.5–5.1)
Sodium: 141 mmol/L (ref 135–145)
TOTAL PROTEIN: 8.1 g/dL (ref 6.5–8.1)

## 2016-12-13 LAB — I-STAT BETA HCG BLOOD, ED (MC, WL, AP ONLY)

## 2016-12-13 LAB — RAPID URINE DRUG SCREEN, HOSP PERFORMED
Amphetamines: POSITIVE — AB
BENZODIAZEPINES: NOT DETECTED
Barbiturates: NOT DETECTED
COCAINE: POSITIVE — AB
Opiates: POSITIVE — AB
Tetrahydrocannabinol: POSITIVE — AB

## 2016-12-13 LAB — SALICYLATE LEVEL

## 2016-12-13 LAB — ACETAMINOPHEN LEVEL

## 2016-12-13 LAB — ETHANOL

## 2016-12-13 MED ORDER — LOPERAMIDE HCL 2 MG PO CAPS: 2.0000 mg | ORAL_CAPSULE | ORAL | Status: DC | PRN

## 2016-12-13 MED ORDER — NAPROXEN 500 MG PO TABS
500.0000 mg | ORAL_TABLET | Freq: Two times a day (BID) | ORAL | Status: DC | PRN
  Administered 2016-12-13: 500 mg via ORAL
  Filled 2016-12-13: qty 1

## 2016-12-13 MED ORDER — ACETAMINOPHEN 325 MG PO TABS
650.0000 mg | ORAL_TABLET | Freq: Four times a day (QID) | ORAL | Status: DC | PRN
  Administered 2016-12-13: 650 mg via ORAL
  Filled 2016-12-13: qty 2

## 2016-12-13 MED ORDER — SODIUM CHLORIDE 0.9 % IV BOLUS (SEPSIS)
1000.0000 mL | Freq: Once | INTRAVENOUS | Status: AC
  Administered 2016-12-13: 1000 mL via INTRAVENOUS

## 2016-12-13 MED ORDER — METHOCARBAMOL 500 MG PO TABS
500.0000 mg | ORAL_TABLET | Freq: Three times a day (TID) | ORAL | Status: DC | PRN
  Administered 2016-12-13: 500 mg via ORAL
  Filled 2016-12-13: qty 1

## 2016-12-13 MED ORDER — LORAZEPAM 2 MG/ML IJ SOLN
1.0000 mg | Freq: Once | INTRAMUSCULAR | Status: AC
  Administered 2016-12-13: 1 mg via INTRAVENOUS
  Filled 2016-12-13: qty 1

## 2016-12-13 MED ORDER — LORAZEPAM 2 MG/ML IJ SOLN
2.0000 mg | Freq: Once | INTRAMUSCULAR | Status: AC
  Administered 2016-12-13: 2 mg via INTRAMUSCULAR
  Filled 2016-12-13: qty 1

## 2016-12-13 MED ORDER — CLONIDINE HCL 0.1 MG PO TABS: 0.1000 mg | ORAL_TABLET | ORAL | Status: DC

## 2016-12-13 MED ORDER — HALOPERIDOL LACTATE 5 MG/ML IJ SOLN
5.0000 mg | Freq: Once | INTRAMUSCULAR | Status: AC
  Administered 2016-12-13: 5 mg via INTRAMUSCULAR
  Filled 2016-12-13: qty 1

## 2016-12-13 MED ORDER — ONDANSETRON 4 MG PO TBDP
4.0000 mg | ORAL_TABLET | Freq: Three times a day (TID) | ORAL | Status: DC | PRN
  Administered 2016-12-13: 4 mg via ORAL
  Filled 2016-12-13: qty 1

## 2016-12-13 MED ORDER — CLONIDINE HCL 0.1 MG PO TABS
0.1000 mg | ORAL_TABLET | Freq: Four times a day (QID) | ORAL | Status: DC
  Administered 2016-12-13: 0.1 mg via ORAL
  Filled 2016-12-13 (×2): qty 1

## 2016-12-13 MED ORDER — CLONIDINE HCL 0.1 MG PO TABS: 0.1000 mg | ORAL_TABLET | Freq: Every day | ORAL | Status: DC

## 2016-12-13 MED ORDER — DICYCLOMINE HCL 20 MG PO TABS
20.0000 mg | ORAL_TABLET | Freq: Four times a day (QID) | ORAL | Status: DC | PRN
  Administered 2016-12-13: 20 mg via ORAL
  Filled 2016-12-13: qty 1

## 2016-12-13 MED ORDER — HYDROXYZINE HCL 25 MG PO TABS
25.0000 mg | ORAL_TABLET | Freq: Four times a day (QID) | ORAL | Status: DC | PRN
  Administered 2016-12-13: 25 mg via ORAL
  Filled 2016-12-13: qty 1

## 2016-12-13 NOTE — ED Triage Notes (Signed)
Pt states she is having suicidal thoughts and tried to kill herself tonight by doing heroin  Pt states she does not think it was heroin because she feels like it may have been speed because she feels anxious   Pt states she is homeless

## 2016-12-13 NOTE — ED Provider Notes (Signed)
[facility]-EMERGENCY DEPT Provider Note   CSN: [id number] Arrival date & time: 12/13/16  0156     History   Chief Complaint Chief Complaint  Patient presents with  . Suicidal    HPI [name] is a 26 y.o. female.  HPI  This is a 26 year old female with history of bipolar disorder, polysubstance abuse, ADD who presents with suicidal ideation.  Patient reports that she feels suicidal.  She reports that she would end her life by taking too many drugs.  She last shot up 4 hours ago.  She normally uses heroin.  However, she states that she thinks her heroin was laced.  She reports anxiety and nauseousness.  Denies any alcohol use.  Reports history of cutting behavior.  Past Medical History:  Diagnosis Date  . ADD (attention deficit disorder)   . Anxiety   . Bipolar 1 disorder (HCC)   . Depression   . HA (headache)   . Hypertension   . PTSD (post-traumatic stress disorder)   . Status post tubal ligation 11/21/2015   Postpartum    Patient Active Problem List   Diagnosis Date Noted  . Status post tubal ligation 11/21/2015  . Postpartum care following vaginal delivery 11/20/2015  . Post term pregnancy at [redacted] weeks gestation 11/19/2015  . PTSD (post-traumatic stress disorder)   . Depression   . Bipolar 1 disorder (HCC)   . Major depression 02/16/2013  . Alcohol abuse 02/16/2013  . Anxiety state, unspecified 02/16/2013    Past Surgical History:  Procedure Laterality Date  . MOUTH SURGERY    . TUBAL LIGATION Bilateral 11/21/2015   Procedure: POST PARTUM TUBAL LIGATION;  Surgeon: Conard Novak, MD;  Location: [facility] ORS;  Service: Gynecology;  Laterality: Bilateral;    OB History    Gravida Para Term Preterm AB Living   5 4 4   1 4    SAB TAB Ectopic Multiple Live Births   1     0 4       Home Medications    Prior to Admission medications   Not on File    Family History Family History  Problem Relation Age of Onset  . Alcohol  abuse Mother   . Depression Mother   . Heart attack Mother   . Alcohol abuse Father   . Diabetes Father     Social History Social History  Substance Use Topics  . Smoking status: Current Every Day Smoker    Packs/day: 0.50    Types: Cigarettes  . Smokeless tobacco: Never Used  . Alcohol use No     Comment: Patient endorses drinking occassionally. Amount unspecified      Allergies   Patient has no known allergies.   Review of Systems Review of Systems  Constitutional: Negative for fever.  Respiratory: Negative for shortness of breath.   Cardiovascular: Positive for palpitations.  Gastrointestinal: Positive for nausea and vomiting. Negative for abdominal distention and diarrhea.  Genitourinary: Negative for dysuria.  Psychiatric/Behavioral: Positive for self-injury and suicidal ideas.  All other systems reviewed and are negative.    Physical Exam Updated Vital Signs BP (!) 140/110 (BP Location: Right Arm)   Pulse (!) 113   Temp 98.5 F (36.9 C) (Oral)   Resp 16   LMP 11/20/2016 (Exact Date)   SpO2 100%   Physical Exam  Constitutional: She is oriented to person, place, and time. No distress.  Disheveled appearing, no acute distress  HENT:  Head: Normocephalic and atraumatic.  Eyes: Pupils are equal, round, and reactive to light.  Cardiovascular: Regular rhythm and normal heart sounds.   Tachycardia  Pulmonary/Chest: Effort normal. No respiratory distress. She has no wheezes.  Abdominal: Soft. There is no tenderness.  Neurological: She is alert and oriented to person, place, and time.  Skin: Skin is warm and dry.  Track marks bilateral upper extremities  Psychiatric: She has a normal mood and affect.  Nursing note and vitals reviewed.    ED Treatments / Results  Labs (all labs ordered are listed, but only abnormal results are displayed) Labs Reviewed  COMPREHENSIVE METABOLIC PANEL - Abnormal; Notable for the following:       Result Value   AST 79 (*)      ALT 120 (*)    All other components within normal limits  ACETAMINOPHEN LEVEL - Abnormal; Notable for the following:    Acetaminophen (Tylenol), Serum <10 (*)    All other components within normal limits  CBC - Abnormal; Notable for the following:    Hemoglobin 15.4 (*)    All other components within normal limits  ETHANOL  SALICYLATE LEVEL  RAPID URINE DRUG SCREEN, HOSP PERFORMED  I-STAT BETA HCG BLOOD, ED ([facility], [facility], [facility] ONLY)    EKG  EKG Interpretation  Date/Time:  Wednesday December 13 2016 05:46:03 EDT Ventricular Rate:  98 PR Interval:    QRS Duration: 83 QT Interval:  357 QTC Calculation: 456 R Axis:   77 Text Interpretation:  Sinus rhythm Consider right atrial enlargement Baseline wander in lead(s) III aVF Confirmed by Ross MarcusHorton, Adanna Zuckerman ([id number]) on 12/13/2016 6:02:43 AM Also confirmed by Ross MarcusHorton, Trisa Cranor ([id number]), editor Elita QuickWatlington, Beverly ([id number])  on 12/13/2016 7:19:39 AM       Radiology No results found.  Procedures Procedures (including critical care time)  Medications Ordered in ED Medications  LORazepam (ATIVAN) injection 1 mg (not administered)  sodium chloride 0.9 % bolus 1,000 mL (not administered)  LORazepam (ATIVAN) injection 1 mg (1 mg Intravenous Given 12/13/16 0536)  sodium chloride 0.9 % bolus 1,000 mL (0 mLs Intravenous Stopped 12/13/16 0701)     Initial Impression / Assessment and Plan / ED Course  I have reviewed the triage vital signs and the nursing notes.  Pertinent labs & imaging results that were available during my care of the patient were reviewed by me and considered in my medical decision making (see chart for details).  Clinical Course as of Dec 14 719  Wed Dec 13, 2016  0720 She was persistent tachycardia to 120.  Continuing to report palpitations but otherwise nontoxic-appearing.  Patient given 1 additional liter of fluids and 1 additional milligram of Ativan.  [CH]    Clinical Course User Index [CH] Khyson Sebesta, Mayer Maskerourtney F, MD     Patient presents with suicidal ideation and drug abuse.  She is nontoxic appearing on exam.  Generally disheveled.  She is tachycardic but otherwise vital signs are reassuring.  Patient was given fluids and Ativan.  EKG shows sinus tachycardia.  Laboratory workup is otherwise reassuring.  See reassessment above.  Patient is persistently tachycardic.  She was redosed fluids and Ativan.  She will need recheck prior to medical clearance.  Final Clinical Impressions(s) / ED Diagnoses   Final diagnoses:  Suicidal ideation  Polysubstance abuse (HCC)  Tachycardia    New Prescriptions New Prescriptions   No medications on file     Shon BatonHorton, Dabney Schanz F, MD 12/13/16 [phone]

## 2016-12-13 NOTE — ED Notes (Signed)
Dr Silverio LayYao filling out IVC paperwork because Pt attempted to leave.    Pt had reported to this writer that she couldn't stay and would not be going to [facility].  Pt asking for belongings and reported "you don't know me like that..I will get them."  This writer attempted to explain IVC process and informed her that we would be IVCing her, if she attempted to leave.  When asked if she had any other questions, Pt sts "I guess, this is goodbye.  I won't be staying."  When this writer left to inform psych NP, Pt attempted to leave.  Security escort Pt back to room.

## 2016-12-13 NOTE — ED Notes (Signed)
Pt reports feeling better after receiving medication earlier.  Will attempt to move to [facility].
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
## 2016-12-13 NOTE — BH Assessment (Addendum)
[facility]  Per Juanetta BeetsJacqueline Norman, DO, this pt requires psychiatric hospitalization at this time.  [facility] 16[facility]01 [facility]  Pt has been accepted to their facility by [facility].  Laveda AbbeLaurie Britton Parks, FNP, concurs with this disposition.  [facility] requests that pt sign their consent for admission form, which she has fax[facility].   Pt has signed form and it has been faxed back to [phone].  Pt's nurse, Aram BeechamCynthia, has been notified, and agrees to call report to [phone].  [facility] with original signed form.  Doylene Canninghomas Saivion Goettel, MA Triage Specialist [phone]   Addendum:  After pt signed consent for admission, she reportedly asked to leave [facility].  ED staff will pursue IVC.  [facility] been notified.  [facility]  Doylene Canninghomas Gladis Soley, MA Triage Specialist [phone]

## 2016-12-13 NOTE — ED Provider Notes (Signed)
  Physical Exam  BP 117/85 (BP Location: Right Arm)   Pulse 97   Temp 98.2 F (36.8 C) (Oral)   Resp 16   LMP 11/20/2016 (Exact Date)   SpO2 100%   Physical Exam  ED Course  Procedures  MDM Patient was seen by psych and recommend [facility]. She is agitated and refused to go. Unable to contract to safety. Will fill out IVC paperwork.       Charlynne PanderYao, Amairani Shuey Hsienta, MD 12/13/16 [phone]

## 2016-12-13 NOTE — ED Notes (Signed)
Bed: WA26 Expected date: 12/12/16 Expected time:  Means of arrival:  Comments: 

## 2016-12-13 NOTE — ED Notes (Addendum)
Pt asking if she can go outside to smoke a cigarette.  Sts "this little room is worse than jail."  Pt informed, again, about plan of care.  Sts  "would this be faster if I came back in the morning?"  Pt informed she would have to start the process over, if she left.  Pt becoming increasingly restless.   TT[facility].

## 2016-12-13 NOTE — ED Provider Notes (Signed)
  Physical Exam  BP (!) 142/99 (BP Location: Right Arm)   Pulse 73   Temp 98.5 F (36.9 C) (Oral)   Resp 18   LMP 11/20/2016 (Exact Date)   SpO2 100%   Physical Exam  ED Course  Procedures  MDM Patient's heart rate has normalized.  Now medically cleared.  To be seen by TTS.       Benjiman CorePickering, Arvind Mexicano, MD 12/13/16 1043

## 2016-12-13 NOTE — ED Notes (Signed)
Patient unable to keep po Zofran down.  MD notified.

## 2016-12-13 NOTE — ED Notes (Signed)
Peer Specialists at bedside.

## 2016-12-13 NOTE — ED Notes (Signed)
Pt resting at present, no distress noted, calm at present.  Monitoring for safety, Q 15 min checks in effect.  Pending [facility] in the am.

## 2016-12-13 NOTE — ED Notes (Signed)
Bed: WTR7 Expected date:  Expected time:  Means of arrival:  Comments: 

## 2016-12-13 NOTE — ED Notes (Signed)
Patient c/o nausea and a headache.  Dr. Rubin PayorPickering notified.  Zofran and tylenol ordered.

## 2016-12-13 NOTE — ED Notes (Signed)
Sp[facility] with [facility] to update them on [facility]. Pt is asleep after receiving the Ativan and Haldol and Boneta LucksJenny feels it might be better to wait for a bed until morning. She is also considering an acute bed because of pt's delusional-type behaviors (looking in other pt's rooms for someone named Jill AlexandersJustin).

## 2016-12-13 NOTE — ED Notes (Signed)
After vomiting a large amount patient able to keep Zofran down.

## 2016-12-13 NOTE — ED Notes (Signed)
Introduced self to patient. Pt oriented to unit expectations.  Assessed pt for:  A) Anxiety &/or agitation: Pt brought to the [facility] after IVC initiated. Per report she was stating that she was going to leave the hospital. On admission to the [facility] she appears to be somewhat disorganized in her thinking and perhaps having delusions. She thinks that someone is here who is not here and she has looked into other patient rooms to find that person. She has redirected back to her room. Haldol and Ativan given and pt allowed it to be given IM in her right ventrogluteal. She was then offered food and drink.   S) Safety: Safety maintained with q-15-minute checks and hourly rounds by staff.  A) ADLs: Pt able to perform ADLs independently.  P) Pick-Up (room cleanliness): Pt's room clean and free of clutter.

## 2016-12-13 NOTE — ED Notes (Signed)
[facility] Expected date:  Expected time:  Means of arrival:  Comments: Hold for room 26

## 2016-12-13 NOTE — ED Notes (Signed)
Pt asked for urine specimen  Unable to provide at this time  Pt given a sprite

## 2016-12-13 NOTE — ED Notes (Signed)
Pt rewanded by Security and [facility].

## 2016-12-14 DIAGNOSIS — F332 Major depressive disorder, recurrent severe without psychotic features: Secondary | ICD-10-CM | POA: Diagnosis present

## 2016-12-14 NOTE — ED Notes (Signed)
[facility]   Pt was in no distress.  Pt was very irritable.  All belongings were returned to patient.

## 2016-12-14 NOTE — ED Notes (Signed)
[facility] REQUESTED.

## 2017-01-19 ENCOUNTER — Emergency Department (HOSPITAL_COMMUNITY)
Admission: EM | Admit: 2017-01-19 | Discharge: 2017-01-20 | Payer: Medicaid Other | Attending: Emergency Medicine | Admitting: Emergency Medicine

## 2017-01-19 ENCOUNTER — Encounter (HOSPITAL_COMMUNITY): Payer: Self-pay | Admitting: Internal Medicine

## 2017-01-19 DIAGNOSIS — I10 Essential (primary) hypertension: Secondary | ICD-10-CM | POA: Insufficient documentation

## 2017-01-19 DIAGNOSIS — T401X1A Poisoning by heroin, accidental (unintentional), initial encounter: Secondary | ICD-10-CM

## 2017-01-19 DIAGNOSIS — F1910 Other psychoactive substance abuse, uncomplicated: Secondary | ICD-10-CM | POA: Diagnosis not present

## 2017-01-19 DIAGNOSIS — F17210 Nicotine dependence, cigarettes, uncomplicated: Secondary | ICD-10-CM | POA: Diagnosis not present

## 2017-01-19 LAB — COMPREHENSIVE METABOLIC PANEL
ALT: 289 U/L — AB (ref 14–54)
ANION GAP: 8 (ref 5–15)
AST: 186 U/L — ABNORMAL HIGH (ref 15–41)
Albumin: 4.2 g/dL (ref 3.5–5.0)
Alkaline Phosphatase: 68 U/L (ref 38–126)
BILIRUBIN TOTAL: 0.4 mg/dL (ref 0.3–1.2)
BUN: 18 mg/dL (ref 6–20)
CO2: 28 mmol/L (ref 22–32)
CREATININE: 0.85 mg/dL (ref 0.44–1.00)
Calcium: 9.4 mg/dL (ref 8.9–10.3)
Chloride: 107 mmol/L (ref 101–111)
GFR calc non Af Amer: >60 mL/min (ref >60)
Glucose, Bld: 86 mg/dL (ref 65–99)
Potassium: 3.4 mmol/L — ABNORMAL LOW (ref 3.5–5.1)
SODIUM: 143 mmol/L (ref 135–145)
TOTAL PROTEIN: 8 g/dL (ref 6.5–8.1)

## 2017-01-19 LAB — CBC WITH DIFFERENTIAL/PLATELET
Basophils Absolute: 0.1 10*3/uL (ref 0.0–0.1)
Basophils Relative: 1 %
EOS ABS: 0.2 10*3/uL (ref 0.0–0.7)
Eosinophils Relative: 2 %
HEMATOCRIT: 42.5 % (ref 36.0–46.0)
HEMOGLOBIN: 14.3 g/dL (ref 12.0–15.0)
LYMPHS ABS: 3 10*3/uL (ref 0.7–4.0)
LYMPHS PCT: 31 %
MCH: 31.9 pg (ref 26.0–34.0)
MCHC: 33.6 g/dL (ref 30.0–36.0)
MCV: 94.9 fL (ref 78.0–100.0)
MONOS PCT: 7 %
Monocytes Absolute: 0.7 10*3/uL (ref 0.1–1.0)
NEUTROS ABS: 5.6 10*3/uL (ref 1.7–7.7)
NEUTROS PCT: 59 %
Platelets: 249 10*3/uL (ref 150–400)
RBC: 4.48 MIL/uL (ref 3.87–5.11)
RDW: 13.2 % (ref 11.5–15.5)
WBC: 9.5 10*3/uL (ref 4.0–10.5)

## 2017-01-19 LAB — ETHANOL

## 2017-01-19 LAB — SALICYLATE LEVEL

## 2017-01-19 LAB — I-STAT BETA HCG BLOOD, ED (MC, WL, AP ONLY)

## 2017-01-19 LAB — ACETAMINOPHEN LEVEL

## 2017-01-19 MED ORDER — SODIUM CHLORIDE 0.9 % IV BOLUS (SEPSIS)
1000.0000 mL | Freq: Once | INTRAVENOUS | Status: AC
  Administered 2017-01-19: 1000 mL via INTRAVENOUS

## 2017-01-19 MED ORDER — NALOXONE HCL 0.4 MG/ML IJ SOLN
0.2000 mg | Freq: Once | INTRAMUSCULAR | Status: AC
  Administered 2017-01-19: 0.2 mg via INTRAVENOUS
  Filled 2017-01-19: qty 1

## 2017-01-19 NOTE — ED Notes (Signed)
[facility] Expected date:  Expected time:  Means of arrival:  Comments: 26 yo OD (A&O, narcan given)

## 2017-01-19 NOTE — ED Triage Notes (Signed)
[facility]. Patient reports she took heroin and ecstasy this morning. On EMS arrival her pupils were pin point. She is easily aroused and was given 4mg  narcan intranasal. Pt is currently alert and oriented x4 and is following all commands.

## 2017-01-20 NOTE — ED Notes (Signed)
[facility]. Back to jail.

## 2017-01-20 NOTE — ED Provider Notes (Signed)
[facility]-EMERGENCY DEPT Provider Note   CSN: [id number] Arrival date & time: 01/19/17  1633     History   Chief Complaint Chief Complaint  Patient presents with  . Drug Overdose    HPI [name] is a 26 y.o. female.  HPI   26 year old female with a history of bipolar, PTSD, depression, presents from jail with concern for overdose on heroin and ecstasy.  Patient denies suicidal ideation, homicidal ideation or hallucinations.  EMS gave her 4 mg of intranasal Narcan, patient was alert and oriented following commands.  In the emergency department, she is sleepy, however will awaken, follow commands, immediately fall back to sleep.  She denies any other concerns at this time, but history limited by somnolence.   Past Medical History:  Diagnosis Date  . ADD (attention deficit disorder)   . Anxiety   . Bipolar 1 disorder (HCC)   . Depression   . HA (headache)   . Hypertension   . PTSD (post-traumatic stress disorder)   . Status post tubal ligation 11/21/2015   Postpartum    Patient Active Problem List   Diagnosis Date Noted  . 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 Status post tubal ligation 11/21/2015  . Postpartum care following vaginal delivery 11/20/2015  . Post term pregnancy at [redacted] weeks gestation 11/19/2015  . PTSD (post-traumatic stress disorder)   . Depression   . Bipolar 1 disorder (HCC)   . Major depression 02/16/2013  . Alcohol abuse 02/16/2013  . Anxiety state, unspecified 02/16/2013    Past Surgical History:  Procedure Laterality Date  . MOUTH SURGERY    . TUBAL LIGATION Bilateral 11/21/2015   Procedure: POST PARTUM TUBAL LIGATION;  Surgeon: Conard NovakStephen D Jackson, MD;  Location: [facility] ORS;  Service: Gynecology;  Laterality: Bilateral;    OB History    Gravida Para Term Preterm AB Living   5 4 4   1 4    SAB TAB Ectopic Multiple Live Births   1     0 4       Home  Medications    Prior to Admission medications   Not on File    Family History Family History  Problem Relation Age of Onset  . Alcohol abuse Mother   . Depression Mother   . Heart attack Mother   . Alcohol abuse Father   . Diabetes Father     Social History Social History   Tobacco Use  . Smoking status: Current Every Day Smoker    Packs/day: 0.50    Types: Cigarettes  . Smokeless tobacco: Never Used  Substance Use Topics  . Alcohol use: No    Alcohol/week: 0.0 oz    Comment: Patient endorses drinking occassionally. Amount unspecified   . Drug use: Yes    Types: Methamphetamines, Heroin, Amphetamines, Marijuana    Comment: heroin     Allergies   Patient has no known allergies.   Review of Systems Review of Systems  Unable to perform ROS: Mental status change  Constitutional: Positive for fatigue.  Psychiatric/Behavioral: Negative for self-injury and suicidal ideas.     Physical Exam Updated Vital Signs BP 104/66   Pulse 64   Temp 97.7 F (36.5 C)   Resp 14   Ht 5\' 3"  (1.6 m)   Wt 52.2 kg (115 lb)   SpO2 98%   BMI 20.37 kg/m   Physical Exam  Constitutional: She appears well-developed and well-nourished. She appears listless. No  distress.  HENT:  Head: Normocephalic and atraumatic.  Eyes: Conjunctivae and EOM are normal.  miosis  Neck: Normal range of motion.  Cardiovascular: Normal rate, regular rhythm, normal heart sounds and intact distal pulses. Exam reveals no gallop and no friction rub.  No murmur heard. Pulmonary/Chest: Effort normal and breath sounds normal. No respiratory distress. She has no wheezes. She has no rales.  Abdominal: Soft. She exhibits no distension. There is no tenderness. There is no guarding.  Musculoskeletal: She exhibits no edema or tenderness.  Neurological: She appears listless.  Awakens to sternal rub, following commands, answers some questions then falls back to sleep  Skin: Skin is warm and dry. No rash noted. She  is not diaphoretic. No erythema.  Nursing note and vitals reviewed.    ED Treatments / Results  Labs (all labs ordered are listed, but only abnormal results are displayed) Labs Reviewed  COMPREHENSIVE METABOLIC PANEL - Abnormal; Notable for the following components:      Result Value   Potassium 3.4 (*)    AST 186 (*)    ALT 289 (*)    All other components within normal limits  ACETAMINOPHEN LEVEL - Abnormal; Notable for the following components:   Acetaminophen (Tylenol), Serum <10 (*)    All other components within normal limits  CBC WITH DIFFERENTIAL/PLATELET  SALICYLATE LEVEL  ETHANOL  I-STAT BETA HCG BLOOD, ED ([facility], [facility], [facility] ONLY)    EKG  EKG Interpretation  Date/Time:  Friday January 19 2017 18:31:57 EST Ventricular Rate:  82 PR Interval:    QRS Duration: 92 QT Interval:  403 QTC Calculation: 471 R Axis:   54 Text Interpretation:  Sinus rhythm No significant change since last tracing Confirmed by Melene PlanFloyd, Dan [id number]) on 01/20/2017 12:54:12 AM       Radiology No results found.  Procedures Procedures (including critical care time)  Medications Ordered in ED Medications  sodium chloride 0.9 % bolus 1,000 mL (0 mLs Intravenous Stopped 01/19/17 2029)  sodium chloride 0.9 % bolus 1,000 mL (0 mLs Intravenous Stopped 01/19/17 2029)  naloxone [facility](NARCAN) injection 0.2 mg (0.2 mg Intravenous Given 01/19/17 2032)  sodium chloride 0.9 % bolus 1,000 mL (0 mLs Intravenous Stopped 01/19/17 2210)     Initial Impression / Assessment and Plan / ED Course  I have reviewed the triage vital signs and the nursing notes.  Pertinent labs & imaging results that were available during my care of the patient were reviewed by me and considered in my medical decision making (see chart for details).      26 year old female with a history of bipolar, PTSD, depression, presents from jail with concern for overdose on heroin and ecstasy.  On my initial evaluation, she is somnolent however is  protecting her airway, awakening with sternal rub. Easily awoken after .1mg  of narcan.  Continued to monitor with patient protecting airway.  Blood pressures lower likely secondary to overdose and suspect low baseline blood pressures.  She was given fluids, improvement in blood pressures.  Her mental status 6hr later is not at baseline, however is significantly improved, awaking to voice, answering questions more clearly.  Given her significant improvement, do not feel admission for overdose is indicated at this time but do feel she requires more time to continue to metabolize, will ambulate, have patient eat, drink, and may discharge when stable.    Denies SI, and does not have signs of other ingestions.  Care signed out to Dr. Adela LankFloyd with reeval pending.  Final Clinical  Impressions(s) / ED Diagnoses   Final diagnoses:  Accidental overdose of heroin, initial encounter [facility])  Polysubstance abuse [facility])    ED Discharge Orders    None       Alvira Monday, MD 01/20/17 [phone]

## 2017-01-20 NOTE — ED Provider Notes (Signed)
I received this patient in signout from Dr. Dalene SeltzerSchlossman.  Briefly the patient had a multiple drug overdose.  There is been some difficulty arousing her to ensure that she was safe for discharge.  The patient was observed longer in the emergency department.  She was able to ambulate to the bathroom on her own and was able to eat.  I will [facility] back [facility].   Melene PlanFloyd, Ryan Palermo, DO 01/20/17 [phone]

## 2017-01-20 NOTE — Discharge Instructions (Signed)
There is help if you need it.  Please do not use dirty needles, this could cause you a severe infection to your skin, heart or spinal cord.  This could kill you or leave you permanently disabled.   ° °[place] [facility]id Problem ([facility]) °Fixed; mobile; peer-based °[name] °([phone] °[email or web address] °Fixed site exchange at [employer], Wednesdays (2-5pm) and Thursdays (3-8pm). °[address] °[place], [place] [address] °Call or text to arrange mobile and peer exchange, Mondays (1-4pm) and Fridays (4-7pm). °Serving [place] ° °

## 2017-01-20 NOTE — ED Notes (Addendum)
Pt. Assisted to bathroom, noted with unsteady gait, pt. Voided with cloudy yellow urine. assited back to bed ,meal offered , PO fluids tolerated. Pt. Still appeared sleepy.

## 2017-05-24 ENCOUNTER — Other Ambulatory Visit: Payer: Self-pay

## 2017-05-24 ENCOUNTER — Emergency Department (HOSPITAL_COMMUNITY)
Admission: EM | Admit: 2017-05-24 | Discharge: 2017-05-24 | Disposition: A | Discharge status: Home / Self Care | Payer: Medicaid Other | Attending: Emergency Medicine | Admitting: Emergency Medicine

## 2017-05-24 ENCOUNTER — Encounter (HOSPITAL_COMMUNITY): Payer: Self-pay | Admitting: Emergency Medicine

## 2017-05-24 DIAGNOSIS — H7290 Unspecified perforation of tympanic membrane, unspecified ear: Secondary | ICD-10-CM | POA: Insufficient documentation

## 2017-05-24 DIAGNOSIS — Z5321 Procedure and treatment not carried out due to patient leaving prior to being seen by health care provider: Secondary | ICD-10-CM | POA: Insufficient documentation

## 2017-05-24 MED ORDER — ACETAMINOPHEN 325 MG PO TABS
650.0000 mg | ORAL_TABLET | Freq: Once | ORAL | Status: AC
  Administered 2017-05-24: 650 mg via ORAL
  Filled 2017-05-24: qty 2

## 2017-05-24 NOTE — ED Triage Notes (Signed)
Pt states that she had a rupture ear drum 4 months ago and was seen in [facility] after antibiotics she states she was supposed to follow up with and ENT but never did. She states that approx 2 days ago she started having increasing pain and swelling on the left side and states that she can hardly hear anything from it. Report that ibuprofen at home helped minimally.

## 2017-05-24 NOTE — ED Notes (Signed)
No response when called to room.  

## 2017-06-10 ENCOUNTER — Encounter (HOSPITAL_COMMUNITY): Payer: Self-pay | Admitting: *Deleted

## 2017-06-10 ENCOUNTER — Emergency Department (HOSPITAL_COMMUNITY)
Admission: EM | Admit: 2017-06-10 | Discharge: 2017-06-11 | Disposition: A | Discharge status: Home / Self Care | Payer: Medicaid Other | Attending: Emergency Medicine | Admitting: Emergency Medicine

## 2017-06-10 ENCOUNTER — Other Ambulatory Visit: Payer: Self-pay

## 2017-06-10 DIAGNOSIS — I10 Essential (primary) hypertension: Secondary | ICD-10-CM | POA: Insufficient documentation

## 2017-06-10 DIAGNOSIS — H9202 Otalgia, left ear: Secondary | ICD-10-CM | POA: Diagnosis not present

## 2017-06-10 DIAGNOSIS — F17210 Nicotine dependence, cigarettes, uncomplicated: Secondary | ICD-10-CM | POA: Diagnosis not present

## 2017-06-10 NOTE — ED Triage Notes (Signed)
Pt states she was diagnosed at [facility] with a ruptured ear drum in November. Continues to have pain in ear; reports more recent pain in neck and face

## 2017-06-11 MED ORDER — IBUPROFEN 600 MG PO TABS: 600.0000 mg | ORAL_TABLET | Freq: Four times a day (QID) | ORAL | 0 refills | Status: DC | PRN

## 2017-06-11 MED ORDER — NEOMYCIN-POLYMYXIN-HC 3.5-10000-1 OT SUSP
3.0000 [drp] | Freq: Four times a day (QID) | OTIC | Status: DC
  Administered 2017-06-11: 3 [drp] via OTIC
  Filled 2017-06-11: qty 10

## 2017-06-11 MED ORDER — IBUPROFEN 400 MG PO TABS
600.0000 mg | ORAL_TABLET | Freq: Once | ORAL | Status: AC
  Administered 2017-06-11: 600 mg via ORAL
  Filled 2017-06-11: qty 1

## 2017-06-11 NOTE — ED Provider Notes (Signed)
[facility] EMERGENCY DEPARTMENT Provider Note   CSN: [id number] Arrival date & time: 06/10/17  2304     History   Chief Complaint Chief Complaint  Patient presents with  . Otalgia    HPI [name] is a 27 y.o. female.  Patient presents with recurrent left ear pain that is severe that started this episode over the last 1-2 days. No fever. She reports ruptured ear drum November of 2018 and has had chronic pain since that time with off and on infections. No current drainage or bleeding from the ear. No fever. No difficulty swallowing. She has not taken anything for pain prior to arrival.      Past Medical History:  Diagnosis Date  . ADD (attention deficit disorder)   . Anxiety   . Bipolar 1 disorder (HCC)   . Depression   . HA (headache)   . Hypertension   . PTSD (post-traumatic stress disorder)   . Status post tubal ligation 11/21/2015   Postpartum    Patient Active Problem List   Diagnosis Date Noted  . 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 Status post tubal ligation 11/21/2015  . Postpartum care following vaginal delivery 11/20/2015  . Post term pregnancy at [redacted] weeks gestation 11/19/2015  . PTSD (post-traumatic stress disorder)   . Depression   . Bipolar 1 disorder (HCC)   . Major depression 02/16/2013  . Alcohol abuse 02/16/2013  . Anxiety state, unspecified 02/16/2013    Past Surgical History:  Procedure Laterality Date  . MOUTH SURGERY    . TUBAL LIGATION Bilateral 11/21/2015   Procedure: POST PARTUM TUBAL LIGATION;  Surgeon: Conard Novak, MD;  Location: [facility] ORS;  Service: Gynecology;  Laterality: Bilateral;     OB History    Gravida  5   Para  4   Term  4   Preterm      AB  1   Living  4     SAB  1   TAB      Ectopic      Multiple  0   Live Births  4            Home Medications    Prior to Admission medications   Not on File    Family  History Family History  Problem Relation Age of Onset  . Alcohol abuse Mother   . Depression Mother   . Heart attack Mother   . Alcohol abuse Father   . Diabetes Father     Social History Social History   Tobacco Use  . Smoking status: Current Every Day Smoker    Packs/day: 0.50    Types: Cigarettes  . Smokeless tobacco: Never Used  Substance Use Topics  . Alcohol use: No    Alcohol/week: 0.0 oz    Comment: Patient endorses drinking occassionally. Amount unspecified   . Drug use: Yes    Types: Methamphetamines, Heroin, Amphetamines, Marijuana    Comment: heroin     Allergies   Patient has no known allergies.   Review of Systems Review of Systems  Constitutional: Negative for fever.  HENT: Positive for ear pain. Negative for congestion, facial swelling, sore throat and trouble swallowing.   Respiratory: Negative for cough.   Gastrointestinal: Negative for vomiting.  Musculoskeletal: Negative for neck stiffness.     Physical Exam Updated Vital Signs BP 110/74 (BP Location: Right Arm)   Pulse 85   Temp 98.3 F (36.8  C) (Oral)   Resp 16   LMP 04/12/2017   SpO2 100%   Physical Exam  Constitutional: She is oriented to person, place, and time. She appears well-developed and well-nourished.  HENT:  Left external ear is unremarkable in appearance. There is pain with ear movement. No redness, swelling or visualized abscess. No pre- or post-auricular lymph node swelling. External canal is swollen without discharge. Visualized TM is clear, no hemotympanum or evidence of rupture.   Neck: Normal range of motion.  Pulmonary/Chest: Effort normal.  Neurological: She is alert and oriented to person, place, and time.  Skin: Skin is warm and dry.     ED Treatments / Results  Labs (all labs ordered are listed, but only abnormal results are displayed) Labs Reviewed - No data to display  EKG None  Radiology No results found.  Procedures Procedures (including  critical care time)  Medications Ordered in ED Medications - No data to display   Initial Impression / Assessment and Plan / ED Course  I have reviewed the triage vital signs and the nursing notes.  Pertinent labs & imaging results that were available during my care of the patient were reviewed by me and considered in my medical decision making (see chart for details).     1. Left otalgia, chronic  Patient here with persistent/recurrent ear pain since ruptured TM 5 months ago. She has not followed up outpatient. No evidence of recurrent rupture now. Will provide cortisporin otic as the external canal is moderately swollen. Ibuprofen for pain.   She is encouraged to see ENT in the outpatient setting for management of chronic/recurrent pain.   Final Clinical Impressions(s) / ED Diagnoses   Final diagnoses:  None    ED Discharge Orders    None       Elpidio Anis, PA-C 06/11/17 0510    Geoffery Lyons, MD 06/11/17 [phone]

## 2017-08-15 DIAGNOSIS — L02413 Cutaneous abscess of right upper limb: Secondary | ICD-10-CM

## 2017-08-15 NOTE — ED Notes (Signed)
Pt states abscess to right arm from IV drug use.

## 2017-08-15 NOTE — ED Notes (Signed)
Pt c/o of severe pain at the site of the abscess, rating it at 10.

## 2017-08-15 NOTE — ED Notes (Signed)
Pt c/o of severe pain at the site of the abscess, rating it at 10.

## 2017-08-15 NOTE — ED Triage Notes (Signed)
Pt states abscess to right arm from IV drug use.

## 2017-08-16 ENCOUNTER — Inpatient Hospital Stay
Admit: 2017-08-16 | Discharge: 2017-08-16 | Disposition: A | Discharge status: Home / Self Care | Payer: Self-pay | Attending: Emergency Medicine

## 2017-08-16 MED ORDER — IBUPROFEN 400 MG TAB
400 mg | ORAL | Status: AC
Start: 2017-08-16 — End: 2017-08-16
  Administered 2017-08-16: 04:00:00 via ORAL

## 2017-08-16 MED ORDER — LIDOCAINE/EPINEPHRINE/TETRACAINE/NA METABISULFITE TOPICAL SOLUTION
CUTANEOUS | Status: AC
Start: 2017-08-16 — End: 2017-08-16
  Administered 2017-08-16: 05:00:00 via TOPICAL

## 2017-08-16 MED ORDER — ACETAMINOPHEN 325 MG TABLET
325 mg | ORAL | Status: AC
Start: 2017-08-16 — End: 2017-08-16
  Administered 2017-08-16: 04:00:00 via ORAL

## 2017-08-16 MED ORDER — TRIMETHOPRIM-SULFAMETHOXAZOLE 160 MG-800 MG TAB
160-800 mg | ORAL_TABLET | Freq: Two times a day (BID) | ORAL | 0 refills | Status: AC
Start: 2017-08-16 — End: 2017-08-23

## 2017-08-16 MED ORDER — IBUPROFEN 600 MG TAB
600 mg | ORAL_TABLET | Freq: Four times a day (QID) | ORAL | 0 refills | Status: AC | PRN
Start: 2017-08-16 — End: ?

## 2017-08-16 MED FILL — TYLENOL 325 MG TABLET: 325 mg | ORAL | Qty: 3

## 2017-08-16 MED FILL — IBUPROFEN 200 MG TAB: 200 mg | ORAL | Qty: 1

## 2017-08-16 MED FILL — LIDOCAINE/EPINEPHRINE/TETRACAINE/NA METABISULFITE TOPICAL SOLUTION: CUTANEOUS | Qty: 6

## 2017-08-16 NOTE — ED Notes (Signed)
Pt c/o being anxious about the procedure (drainage of abscess) to be done. She requests for anxiety med. PA Ashley notified

## 2017-08-16 NOTE — ED Provider Notes (Signed)
ED Provider Notes by Dan Humphreys, PA-C at 08/16/17 0031                Author: Dan Humphreys, PA-C  Service: EMERGENCY  Author Type: Physician Assistant       Filed: 08/16/17 0246  Date of Service: 08/16/17 0031  Status: Attested           Editor: Foye Clock (Physician Assistant)  Cosigner: Jerilynn Som, MD at 08/30/17 [phone]            Procedure Orders        1. I&D Abcess Simple [[id number]] ordered by Dan Humphreys, PA-C                         Attestation signed by Jerilynn Som, MD at 08/30/17 [phone]          Patient's pertinent history, physical exam, labs, radiographic studies and plan were reviewed with advanced practice provider Ms Davonna Belling  I have fully participated  in the care of this patient and agree with the managment.  Lindie Spruce MD, saw and examined the patient finding abscess forearm.  Lungs clear, heart RRR, abdomen soft NT.        Patient has forearm abscess.  Abscess drained.  Signs and symptoms to return to ED discussed with patient.  Patient comfortable with discharge and had no further questions.      Dianna Rossetti, MD      Dragon medical dictation software was used for portions of this report. Unintended voice recognition errors may occur.                                   [facility]   Emergency Department Treatment Report          Patient: Anne Bailey  Age: 27 y.o.  Sex: female          Date of Birth: 04-11-1990  Admit Date: 08/15/2017  PCP: None     MRN: [id number]   CSN: [id number]   Attending: Jerilynn Som, MD         Room: [id number]  Time Dictated: 12:31 AM  APP:  Dan Humphreys, PA-C        Chief Complaint    Abscess      History of Present Illness     27 y.o. female  presents complaining of red, swollen, painful bump to her right forearm which is been getting progressively bigger over the past 2 weeks.  Patient states she was "shooting up meth and I missed my vein" 2 weeks ago and symptoms started after.  Denies  fever, chills, nausea or  vomiting.        Review of Systems        Constitutional: No fever, chills   Respiratory: No cough, dyspnea or wheezing.   Cardiovascular: No chest pain, palpitations,    Gastrointestinal: No vomiting, diarrhea or abdominal pain.   Genitourinary: No dysuria or  frequency   Musculoskeletal: No lower extremity swelling   Integumentary: Positive red, swollen, painful bump to right forearm   Neurological: No headaches   Denies complaints in all other systems.        Past Medical/Surgical History          Past Medical History:        Diagnosis  Date         ?  Liver disease            Hep C         ?  Psychiatric disorder            anxiety, PTSD          Past Surgical History:         Procedure  Laterality  Date          ?  HX GYN              Tubal ligation          ?  HX HEENT                 Social History          Social History          Socioeconomic History         ?  Marital status:  SINGLE              Spouse name:  Not on file         ?  Number of children:  Not on file     ?  Years of education:  Not on file     ?  Highest education level:  Not on file       Tobacco Use         ?  Smoking status:  Current Some Day Smoker              Years:  2.00         ?  Smokeless tobacco:  Current User       Substance and Sexual Activity         ?  Alcohol use:  Not Currently     ?  Drug use:  Yes              Types:  Methamphetamines, Heroin, Marijuana             Family History     History reviewed. No pertinent family history.        Home Medications          None             Allergies     No Known Allergies        Physical Exam          ED Triage Vitals     ED Encounter Vitals Group            BP  08/15/17 2336  (!) 129/91        Pulse (Heart Rate)  08/15/17 2336  (!) 140        Resp Rate  08/15/17 2336  19        Temp  08/15/17 2336  98.7 ??F (37.1 ??C)        Temp src  --          O2 Sat (%)  08/15/17 2336  100 %        Weight  08/15/17 2339  115 lb            Height  --             Constitutional: Patient appears well  developed and well nourished. Appearance and behavior are age and situation appropriate.   Musculoskeletal: No bony tenderness to palpation over the right upper extremity and full range of motion of joints are intact.  Radial pulse  2+  and nailbeds pink with prompt capillary refill.   Integumentary: Large proximates 3 x 3 cm erythematous, fluctuant abscess that is heated and tender to palpation-no active drainage,  no surrounding erythema or edema noted   Neurologic: Grip strength and light touch sensation over the right upper extremity is intact     Impression and Management Plan     27 y.o. female  presents for evaluation of an abscess to her right forearm. Patient is afebrile and overall well-appearing, nontoxic. She appears very anxious and she is tachycardic on exam, rest her vital signs are stable.  We will incise and drain the abscess at this  time.  Patient needs close follow-up with her primary care physician.  Patient advised to return to the ED in 48 hours for wound recheck and packing removal.  Patient advised to return to the ED sooner for new or worsening symptoms.        Diagnostic Studies     Lab:    No results found for this or any previous visit (from the past 12 hour(s)).        ED Course/Medical Decision Making      I&D Abcess Simple  Date/Time:  08/16/2017 2:45 AM   Performed by:  Dan Humphreys, PA-C   Authorized by:  Dan Humphreys, PA-C       Consent:      Consent obtained:  Verbal     Consent given by:  Patient     Risks discussed:  Bleeding, incomplete drainage and pain   Location:      Type:  Abscess     Location:  Upper extremity     Upper extremity location:  Arm     Arm location:  R lower arm   Pre-procedure details:      Skin preparation:  Betadine   Anesthesia (see MAR for exact dosages):      Anesthesia method:  Local infiltration     Local anesthetic:  Lidocaine 2% w/o epi   Procedure type:      Complexity:  Simple   Procedure details:     Needle aspiration: no       Incision  types:  Single straight     Incision depth:  Dermal     Scalpel blade:  11     Wound management:  Probed and deloculated and irrigated with saline     Drainage:  Bloody and purulent     Drainage amount:  Moderate     Packing materials:  1/4 in gauze   Post-procedure details:      Patient tolerance of procedure:  Tolerated well, no immediate complications           Medications       acetaminophen (TYLENOL) tablet 975 mg (975 mg Oral Given 08/16/17 0017)     ibuprofen (MOTRIN) tablet 600 mg (600 mg Oral Given 08/16/17 0017)       lidocaine/EPINEPHrine/tetracaine (LET) topical solution 5 mL (5 mL Topical Given 08/16/17 0050)          Final Diagnosis                  ICD-10-CM  ICD-9-CM          1.  Abscess of right forearm  L02.413  682.3             Disposition     Patient stable for discharge home           Discharge Medication  List as of 08/16/2017  1:29 AM              START taking these medications          Details        trimethoprim-sulfamethoxazole (BACTRIM DS) 160-800 mg per tablet  Take 1 Tab by mouth two (2) times a day for 7 days., Print, Disp-14 Tab, R-0               ibuprofen (MOTRIN) 600 mg tablet  Take 1 Tab by mouth every six (6) hours as needed for Pain., Print, Disp-20 Tab, R-0                      The patient was fully discussed with [facility], Oleta Mouse, MD who agrees with the above assessment and plan         Luna Fuse PA-C   August 16, 2017      My signature above authenticates this document and my orders, the final ??   diagnosis (es), discharge prescription (s), and instructions in the Epic ??   record.   If you have any questions please contact [phone].   ??   Nursing notes have been reviewed by the physician/ advanced practice ??   Clinician.

## 2017-08-16 NOTE — ED Notes (Signed)
Pt c/o being anxious about the procedure (drainage of abscess) to be done. She requests for anxiety med. PA Morrie SheldonAshley notified

## 2017-08-16 NOTE — ED Notes (Signed)
1:39 AM  08/16/17     Discharge instructions given to Anne Bailey (name) with verbalization of understanding. Patient accompanied by friend.  Patient discharged with the following prescriptions bactrim and motrin. Patient discharged to home (destination).      Chad Cordial, [place]

## 2017-08-16 NOTE — ED Provider Notes (Signed)
[facility]  Emergency Department Treatment Report    Patient: Anne Bailey Age: 27 y.o. Sex: female    Date of Birth: 1990/12/25 Admit Date: 08/15/2017 PCP: None   MRN: [id number]  CSN: [id number]  Attending: Jerilynn Som, MD   Room: [phone] Time Dictated: 12:31 AM APP:  Dan Humphreys, PA-C     Chief Complaint   Abscess   History of Present Illness   27 y.o. female presents complaining of red, swollen, painful bump to her right forearm which is been getting progressively bigger over the past 2 weeks.  Patient states she was "shooting up meth and I missed my vein" 2 weeks ago and symptoms started after.  Denies fever, chills, nausea or vomiting.    Review of Systems     Constitutional: No fever, chills  Respiratory: No cough, dyspnea or wheezing.  Cardiovascular: No chest pain, palpitations,   Gastrointestinal: No vomiting, diarrhea or abdominal pain.  Genitourinary: No dysuria or  frequency  Musculoskeletal: No lower extremity swelling  Integumentary: Positive red, swollen, painful bump to right forearm  Neurological: No headaches  Denies complaints in all other systems.    Past Medical/Surgical History     Past Medical History:   Diagnosis Date   ??? Liver disease     Hep C   ??? Psychiatric disorder     anxiety, PTSD     Past Surgical History:   Procedure Laterality Date   ??? HX GYN      Tubal ligation   ??? HX HEENT         Social History     Social History     Socioeconomic History   ??? Marital status: SINGLE     Spouse name: Not on file   ??? Number of children: Not on file   ??? Years of education: Not on file   ??? Highest education level: Not on file   Tobacco Use   ??? Smoking status: Current Some Day Smoker     Years: 2.00   ??? Smokeless tobacco: Current User   Substance and Sexual Activity   ??? Alcohol use: Not Currently   ??? Drug use: Yes     Types: Methamphetamines, Heroin, Marijuana       Family History   History reviewed. No pertinent family history.    Home Medications     None       Allergies    No Known Allergies    Physical Exam     ED Triage Vitals   ED Encounter Vitals Group      BP 08/15/17 2336 (!) 129/91      Pulse (Heart Rate) 08/15/17 2336 (!) 140      Resp Rate 08/15/17 2336 19      Temp 08/15/17 2336 98.7 ??F (37.1 ??C)      Temp src --       O2 Sat (%) 08/15/17 2336 100 %      Weight 08/15/17 2339 115 lb      Height --        Constitutional: Patient appears well developed and well nourished. Appearance and behavior are age and situation appropriate.  Musculoskeletal: No bony tenderness to palpation over the right upper extremity and full range of motion of joints are intact.  Radial pulse 2+ and nailbeds pink with prompt capillary refill.  Integumentary: Large proximates 3 x 3 cm erythematous, fluctuant abscess that is heated and tender to palpation???no active drainage, no surrounding erythema  or edema noted  Neurologic: Grip strength and light touch sensation over the right upper extremity is intact  Impression and Management Plan   27 y.o. female presents for evaluation of an abscess to her right forearm. Patient is afebrile and overall well-appearing, nontoxic. She appears very anxious and she is tachycardic on exam, rest her vital signs are stable.  We will incise and drain the abscess at this time.  Patient needs close follow-up with her primary care physician.  Patient advised to return to the ED in 48 hours for wound recheck and packing removal.  Patient advised to return to the ED sooner for new or worsening symptoms.    Diagnostic Studies   Lab:   No results found for this or any previous visit (from the past 12 hour(s)).    ED Course/Medical Decision Making   I&D Abcess Simple  Date/Time: 08/16/2017 2:45 AM  Performed by: Dan HumphreysHutton, Jed Kutch B, PA-C  Authorized by: Dan HumphreysHutton, Akash Winski B, PA-C     Consent:     Consent obtained:  Verbal    Consent given by:  Patient    Risks discussed:  Bleeding, incomplete drainage and pain  Location:     Type:  Abscess    Location:  Upper extremity     Upper extremity location:  Arm    Arm location:  R lower arm  Pre-procedure details:     Skin preparation:  Betadine  Anesthesia (see MAR for exact dosages):     Anesthesia method:  Local infiltration    Local anesthetic:  Lidocaine 2% w/o epi  Procedure type:     Complexity:  Simple  Procedure details:     Needle aspiration: no      Incision types:  Single straight    Incision depth:  Dermal    Scalpel blade:  11    Wound management:  Probed and deloculated and irrigated with saline    Drainage:  Bloody and purulent    Drainage amount:  Moderate    Packing materials:  1/4 in gauze  Post-procedure details:     Patient tolerance of procedure:  Tolerated well, no immediate complications      Medications   acetaminophen (TYLENOL) tablet 975 mg (975 mg Oral Given 08/16/17 0017)   ibuprofen (MOTRIN) tablet 600 mg (600 mg Oral Given 08/16/17 0017)   lidocaine/EPINEPHrine/tetracaine (LET) topical solution 5 mL (5 mL Topical Given 08/16/17 0050)     Final Diagnosis        ICD-10-CM ICD-9-CM   1. Abscess of right forearm L02.413 682.3       Disposition   Patient stable for discharge home      Discharge Medication List as of 08/16/2017  1:29 AM      START taking these medications    Details   trimethoprim-sulfamethoxazole (BACTRIM DS) 160-800 mg per tablet Take 1 Tab by mouth two (2) times a day for 7 days., Print, Disp-14 Tab, R-0      ibuprofen (MOTRIN) 600 mg tablet Take 1 Tab by mouth every six (6) hours as needed for Pain., Print, Disp-20 Tab, R-0           The patient was fully discussed with [facility], Oleta MouseHELEN H, MD who agrees with the above assessment and plan      Luna Fuseshley Danice Dippolito PA-C  August 16, 2017    My signature above authenticates this document and my orders, the final ??  diagnosis (es), discharge prescription (s), and instructions in the Epic ??  record.  If you have any questions please contact [phone].  ??  Nursing notes have been reviewed by the physician/ advanced practice ??  Clinician.

## 2017-08-16 NOTE — ED Notes (Signed)
1:39 AM  08/16/17     Discharge instructions given to Brett (name) with verbalization of understanding. Patient accompanied by friend.  Patient discharged with the following prescriptions bactrim and motrin. Patient discharged to home (destination).      Summer Carmichael, RN

## 2018-05-30 ENCOUNTER — Emergency Department (HOSPITAL_COMMUNITY)
Admission: EM | Admit: 2018-05-30 | Discharge: 2018-05-31 | Disposition: A | Discharge status: Other Acute Inpatient Hospital | Payer: Medicaid Other | Attending: Emergency Medicine | Admitting: Emergency Medicine

## 2018-05-30 ENCOUNTER — Ambulatory Visit (HOSPITAL_COMMUNITY)
Admission: RE | Admit: 2018-05-30 | Discharge: 2018-05-30 | Disposition: A | Discharge status: Home / Self Care | Payer: Medicaid Other | Attending: Psychiatry | Admitting: Psychiatry

## 2018-05-30 ENCOUNTER — Other Ambulatory Visit: Payer: Self-pay

## 2018-05-30 ENCOUNTER — Encounter (HOSPITAL_COMMUNITY): Payer: Self-pay | Admitting: *Deleted

## 2018-05-30 DIAGNOSIS — F17210 Nicotine dependence, cigarettes, uncomplicated: Secondary | ICD-10-CM | POA: Insufficient documentation

## 2018-05-30 DIAGNOSIS — F329 Major depressive disorder, single episode, unspecified: Secondary | ICD-10-CM | POA: Insufficient documentation

## 2018-05-30 DIAGNOSIS — F419 Anxiety disorder, unspecified: Secondary | ICD-10-CM | POA: Insufficient documentation

## 2018-05-30 DIAGNOSIS — R443 Hallucinations, unspecified: Secondary | ICD-10-CM | POA: Insufficient documentation

## 2018-05-30 DIAGNOSIS — R450 Nervousness: Secondary | ICD-10-CM | POA: Insufficient documentation

## 2018-05-30 DIAGNOSIS — M79672 Pain in left foot: Secondary | ICD-10-CM | POA: Insufficient documentation

## 2018-05-30 DIAGNOSIS — Z7289 Other problems related to lifestyle: Secondary | ICD-10-CM | POA: Insufficient documentation

## 2018-05-30 DIAGNOSIS — R45851 Suicidal ideations: Secondary | ICD-10-CM | POA: Insufficient documentation

## 2018-05-30 DIAGNOSIS — F22 Delusional disorders: Secondary | ICD-10-CM | POA: Insufficient documentation

## 2018-05-30 DIAGNOSIS — Z046 Encounter for general psychiatric examination, requested by authority: Secondary | ICD-10-CM | POA: Insufficient documentation

## 2018-05-30 DIAGNOSIS — I10 Essential (primary) hypertension: Secondary | ICD-10-CM | POA: Insufficient documentation

## 2018-05-30 DIAGNOSIS — R440 Auditory hallucinations: Secondary | ICD-10-CM | POA: Insufficient documentation

## 2018-05-30 DIAGNOSIS — M79671 Pain in right foot: Secondary | ICD-10-CM | POA: Insufficient documentation

## 2018-05-30 LAB — RAPID URINE DRUG SCREEN, HOSP PERFORMED
Amphetamines: POSITIVE — AB
Barbiturates: NOT DETECTED
Benzodiazepines: NOT DETECTED
Cocaine: POSITIVE — AB
Opiates: POSITIVE — AB
Tetrahydrocannabinol: POSITIVE — AB

## 2018-05-30 LAB — CBC
HCT: 42.2 % (ref 36.0–46.0)
Hemoglobin: 14 g/dL (ref 12.0–15.0)
MCH: 32.3 pg (ref 26.0–34.0)
MCHC: 33.2 g/dL (ref 30.0–36.0)
MCV: 97.2 fL (ref 80.0–100.0)
Platelets: 218 10*3/uL (ref 150–400)
RBC: 4.34 MIL/uL (ref 3.87–5.11)
RDW: 13 % (ref 11.5–15.5)
WBC: 10.8 10*3/uL — ABNORMAL HIGH (ref 4.0–10.5)
nRBC: 0 % (ref 0.0–0.2)

## 2018-05-30 LAB — ETHANOL: Alcohol, Ethyl (B): <10 mg/dL (ref <10)

## 2018-05-30 LAB — COMPREHENSIVE METABOLIC PANEL
ALT: 36 U/L (ref 0–44)
AST: 36 U/L (ref 15–41)
Albumin: 4.5 g/dL (ref 3.5–5.0)
Alkaline Phosphatase: 48 U/L (ref 38–126)
Anion gap: 12 (ref 5–15)
BUN: 23 mg/dL — ABNORMAL HIGH (ref 6–20)
CO2: 22 mmol/L (ref 22–32)
Calcium: 9.1 mg/dL (ref 8.9–10.3)
Chloride: 105 mmol/L (ref 98–111)
Creatinine, Ser: 0.86 mg/dL (ref 0.44–1.00)
GFR calc Af Amer: >60 mL/min (ref >60)
GFR calc non Af Amer: >60 mL/min (ref >60)
Glucose, Bld: 68 mg/dL — ABNORMAL LOW (ref 70–99)
Potassium: 3.3 mmol/L — ABNORMAL LOW (ref 3.5–5.1)
Sodium: 139 mmol/L (ref 135–145)
Total Bilirubin: 1 mg/dL (ref 0.3–1.2)
Total Protein: 7.9 g/dL (ref 6.5–8.1)

## 2018-05-30 LAB — VALPROIC ACID LEVEL: Valproic Acid Lvl: <10 ug/mL — ABNORMAL LOW (ref 50.0–100.0)

## 2018-05-30 LAB — ACETAMINOPHEN LEVEL: Acetaminophen (Tylenol), Serum: <10 ug/mL — ABNORMAL LOW (ref 10–30)

## 2018-05-30 LAB — I-STAT BETA HCG BLOOD, ED (MC, WL, AP ONLY): I-stat hCG, quantitative: <5 m[IU]/mL (ref <5)

## 2018-05-30 LAB — SALICYLATE LEVEL: Salicylate Lvl: <7 mg/dL (ref 2.8–30.0)

## 2018-05-30 MED ORDER — ACETAMINOPHEN 325 MG PO TABS
650.0000 mg | ORAL_TABLET | Freq: Once | ORAL | Status: AC
  Administered 2018-05-30: 650 mg via ORAL
  Filled 2018-05-30: qty 2

## 2018-05-30 NOTE — BH Assessment (Signed)
Assessment Note  [name] is a 28 y.o. female [facility] detox and rehab treatment.  Pt stated "I been out of jail for 3 weeks and I want to get myself together so I get my kids."  Pt reports that she started back using unknown amounts of cocaine, meth, and cannabis when she was released from jail.  Pt said she was in jail for possessions charges but does not know if she is currently on probation.  Pt denies SI/HI/A/V-hallucinations.    Pt resides with family and friends and reports that she can return at discharge.  Pt reports having 4 children (2 with family members, 2 in foster care).  Pt denies physical, sexual, and verbal abuse.  Pt report being inpatient in 2015 when her mother died.  Pt denies outpatient MH treatment.  Pt states she wants outpatient SA treatment.  Patient was wearing casual clothes and appeared appropriately groomed.  Pt was alert throughout the assessment.  Patient made good eye contact and had normal psychomotor activity.  Patient spoke in a normal voice without pressured speech.  Pt's affect appeared dramatic and euphoric and congruent with mood. Pt's thought process was tangential.  Pt presented with partial insight and judgement.  Pt did not appear to be responding to internal stimuli. Pt was able to contract for safety.  Disposition: [facility] discussed case with Bh provider, Assunta FoundShuvon Rankin, NP who recommended that the pt discharge with community resources for substance abuse.  TTS provided pt with substance abuse resources.  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 ADD (attention deficit disorder)   . Anxiety   . Bipolar 1 disorder (HCC)   . Depression   . HA (headache)   . Hypertension   . PTSD (post-traumatic stress disorder)   . Status post tubal ligation 11/21/2015   Postpartum    Past Surgical History:  Procedure Laterality Date  . MOUTH SURGERY    . TUBAL LIGATION Bilateral 11/21/2015   Procedure: POST PARTUM TUBAL LIGATION;  Surgeon: Conard NovakStephen D Jackson, MD;  Location: [facility] ORS;  Service: Gynecology;  Laterality: Bilateral;    Family History:  Family History  Problem Relation Age of Onset  . Alcohol abuse Mother   . Depression Mother   . Heart attack Mother   . Alcohol abuse Father   . Diabetes Father     Social History:  reports that she has been smoking cigarettes. She has been smoking about 0.50 packs per day. She has never used smokeless tobacco. She reports current drug use. Drugs: Methamphetamines, Heroin, Amphetamines, and Marijuana. She reports that she does not drink alcohol.  Additional Social History:  Alcohol / Drug Use Pain Medications: See MARs Prescriptions: See MARs Over the Counter: See MARs History of alcohol / drug use?: Yes Longest period of sobriety (when/how long): unknown Negative Consequences of Use: Personal relationships, Financial, Armed forces operational officerLegal, Work / School Substance #1 Name of Substance 1: Cannabis 1 - Age of First Use: unknown 1 - Amount (size/oz): unknown 1 - Frequency: unknown 1 - Duration: ongoing 1 - Last Use / Amount: PTA Substance #2 Name of Substance 2: Cocaine 2 - Age of First Use: unknown 2 - Amount (size/oz): unknown 2 - Frequency: unknown 2 -  Duration: unknown 2 - Last Use / Amount: PTA Substance #3 Name of Substance 3: Amphetamine 3 - Age of First Use: unknown 3 - Amount (size/oz): unknown 3 - Frequency: unknown 3 - Duration: unknown 3 - Last Use / Amount: PTA  CIWA:   COWS:    Allergies: No Known Allergies  Home Medications: (Not in a hospital admission)   OB/GYN Status:  No LMP recorded.  General Assessment Data Location of Assessment: [facility] Assessment Services TTS Assessment: In system Is this a Tele or Face-to-Face Assessment?: Face-to-Face Is this an Initial Assessment or a  Re-assessment for this encounter?: Initial Assessment Patient Accompanied by:: N/A Language Other than English: No Living Arrangements: Other (Comment) What gender do you identify as?: Female Marital status: Single Maiden name: [place] Pregnancy Status: Unknown Living Arrangements: Other (Comment) Can pt return to current living arrangement?: Yes Admission Status: Voluntary Is patient capable of signing voluntary admission?: Yes Referral Source: Self/Family/Friend  Medical Screening Exam [facility] Walk-in ONLY) Medical Exam completed: Yes  Crisis Care Plan Living Arrangements: Other (Comment) Name of Psychiatrist: None Name of Therapist: None  Education Status Is patient currently in school?: No Is the patient employed, unemployed or receiving disability?: Unemployed  Risk to self with the past 6 months Suicidal Ideation: No Has patient been a risk to self within the past 6 months prior to admission? : No Suicidal Intent: No Has patient had any suicidal intent within the past 6 months prior to admission? : No Is patient at risk for suicide?: No Suicidal Plan?: No Has patient had any suicidal plan within the past 6 months prior to admission? : No Access to Means: No What has been your use of drugs/alcohol within the last 12 months?: Cocaine, Meth, Cannabis Previous Attempts/Gestures: No Triggers for Past Attempts: None known Intentional Self Injurious Behavior: None Family Suicide History: Unknown Recent stressful life event(s): Other (Comment)(Pt is seeking detox) Persecutory voices/beliefs?: No Depression: No Depression Symptoms: Insomnia Substance abuse history and/or treatment for substance abuse?: Yes Suicide prevention information given to non-admitted patients: Not applicable  Risk to Others within the past 6 months Homicidal Ideation: No Does patient have any lifetime risk of violence toward others beyond the six months prior to admission? : No Thoughts of Harm to  Others: No Current Homicidal Intent: No Current Homicidal Plan: No Access to Homicidal Means: No History of harm to others?: No Assessment of Violence: None Noted Does patient have access to weapons?: No Criminal Charges Pending?: No Does patient have a court date: Yes Court Date: (unknown) Is patient on probation?: Unknown(pt couldn't remember)  Psychosis Hallucinations: None noted Delusions: None noted  Mental Status Report Appearance/Hygiene: Unremarkable Eye Contact: Poor Motor Activity: Hyperactivity Speech: Incoherent Level of Consciousness: Restless Mood: Suspicious Affect: Euphoric Anxiety Level: None Thought Processes: Irrelevant, Flight of Ideas Judgement: Impaired Orientation: Person, Place Obsessive Compulsive Thoughts/Behaviors: None  Cognitive Functioning Concentration: Normal Memory: Recent Impaired, Remote Impaired Is patient IDD: No Insight: Poor Impulse Control: Poor Appetite: Fair Have you had any weight changes? : No Change Sleep: Decreased Total Hours of Sleep: 0 Vegetative Symptoms: None  ADLScreening [facility] Assessment Services) Patient's cognitive ability adequate to safely complete daily activities?: Yes Patient able to express need for assistance with ADLs?: Yes Independently performs ADLs?: Yes (appropriate for developmental age)  Prior Inpatient Therapy Prior Inpatient Therapy: Yes Prior Therapy Dates: 2015 Prior Therapy Facilty/Provider(s): [facility] Reason for Treatment: "my mom died"  Prior Outpatient Therapy Prior Outpatient Therapy: No Does patient have an ACCT  team?: No Does patient have Intensive In-House Services?  : No Does patient have [facility] services? : No Does patient have P4CC services?: No  ADL Screening (condition at time of admission) Patient's cognitive ability adequate to safely complete daily activities?: Yes Is the patient deaf or have difficulty hearing?: No Does the patient have difficulty seeing, even when  wearing glasses/contacts?: No Does the patient have difficulty concentrating, remembering, or making decisions?: No Patient able to express need for assistance with ADLs?: Yes Does the patient have difficulty dressing or bathing?: No Independently performs ADLs?: Yes (appropriate for developmental age) Does the patient have difficulty walking or climbing stairs?: No Weakness of Legs: None Weakness of Arms/Hands: None  Home Assistive Devices/Equipment Home Assistive Devices/Equipment: None    Abuse/Neglect Assessment (Assessment to be complete while patient is alone) Abuse/Neglect Assessment Can Be Completed: Yes Physical Abuse: Denies Verbal Abuse: Denies Sexual Abuse: Denies Exploitation of patient/patient's resources: Denies Self-Neglect: Denies Values / Beliefs Cultural Requests During Hospitalization: None Spiritual Requests During Hospitalization: None   Advance Directives (For Healthcare) Does Patient Have a Medical Advance Directive?: No Would patient like information on creating a medical advance directive?: No - Patient declined          Disposition: [facility] discussed case with Bh provider, Assunta Found, NP who recommended that the pt discharge with community resources for substance abuse.  TTS provided pt with substance abuse resources.  Disposition Initial Assessment Completed for this Encounter: Yes Disposition of Patient: Discharge Patient refused recommended treatment: No Mode of transportation if patient is discharged/movement?: Bus Patient referred to: [facility], Outpatient clinic referral  On Site Evaluation by:   Reviewed with Physician:    [name] 05/30/2018 6:58 PM
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
## 2018-05-30 NOTE — Patient Outreach (Signed)
ED Peer Support Specialist Patient Intake (Complete at intake & 30-60 Day Follow-up)  Name: [name]  MRN: [id number]  Age: 28 y.o.   Date of Admission: 05/30/2018  Intake: Initial Comments:      Primary Reason Admitted: Substance Abuse   Lab values: Alcohol/ETOH: Negative Positive UDS? Yes Amphetamines: Yes Barbiturates: No Benzodiazepines: No Cocaine: Yes Opiates: Yes Cannabinoids: Yes  Demographic information: Gender: Female Ethnicity: White Marital Status: Single Insurance Status: Uninsured/Self-pay Control and instrumentation engineer (Work Engineer, agricultural, Sales executive, etc.: No Lives with: Alone Living situation: Homeless  Reported Patient History: Patient reported health conditions: Anxiety disorders, Bipolar disorder, Depression, Schizophrenia Patient aware of HIV and hepatitis status: Yes (comment)(Hep C)  In past year, has patient visited ED for any reason? Yes  Number of ED visits:    Reason(s) for visit: various reasons   In past year, has patient been hospitalized for any reason? No  Number of hospitalizations:    Reason(s) for hospitalization:    In past year, has patient been arrested? Yes  Number of arrests: 1  Reason(s) for arrest: POS  In past year, has patient been incarcerated? No  Number of incarcerations:    Reason(s) for incarceration:    In past year, has patient received medication-assisted treatment? No  In past year, patient received the following treatments:    In past year, has patient received any harm reduction services? No  Did this include any of the following?    In past year, has patient received care from a mental health provider for diagnosis other than SUD? No  In past year, is this first time patient has overdosed? No  Number of past overdoses:    In past year, is this first time patient has been hospitalized for an overdose? No  Number of hospitalizations for overdose(s):    Is patient currently  receiving treatment for a mental health diagnosis? No  Patient reports experiencing difficulty participating in SUD treatment: No    Most important reason(s) for this difficulty?    Has patient received prior services for treatment? No  In past, patient has received services from following agencies:    Plan of Care:  Suggested follow up at these agencies/treatment centers: (Inpatient services)  Other information: Pt stated that she wants inpatient help and that she does not want to continue to live her life like this. CPSS will follow up with Pt in the AM to assist Pt with seeking treatment facilities.   Arlys John Obrien Huskins, CPSS  05/30/2018 11:32 PM

## 2018-05-30 NOTE — ED Triage Notes (Signed)
Pt says she is hearing voices that are telling her to hurt herself. +meth and +cocaine use this morning. Denies specific plans to hurt herself, or anyone else.

## 2018-05-30 NOTE — ED Provider Notes (Addendum)
[facility]-EMERGENCY DEPT Provider Note   CSN: [id number] Arrival date & time: 05/30/18  2012    History   Chief Complaint Chief Complaint  Patient presents with  . Paranoid    HPI [name] is a 28 y.o. female presenting for evaluation of suicidal thoughts, auditory hallucinations, and paranoia.  Patient states she is having thoughts about wanting to hurt herself, and she is hearing voices telling her to hurt herself.  She states these are not new, but today are worse than normal.  She is not on any of her medications, which apparently included Depakote, Lamictal, and another unknown 1.  Patient reports meth use, last use today.  She does not want to have a lack of cigarettes a day.  Reports occasional alcohol use.  Patient states she uses other drugs, but cannot name them.  Patient states she is been feeling very down today, worse than normal.  She has bruises on her arms, which she states are from her hurting herself.  She denies abuse.  She denies HI or visual hallucinations.  Additional history obtained per chart review.  Patient has a history of bipolar, PTSD, hypertension, depression, ADD, and anxiety.  She was seen at behavioral health today, but she was felt to be safe for discharge at that time.  It appears that since then, patient has had worsening SI.     HPI  Past Medical History:  Diagnosis Date  . ADD (attention deficit disorder)   . Anxiety   . Bipolar 1 disorder (HCC)   . Depression   . HA (headache)   . Hypertension   . PTSD (post-traumatic stress disorder)   . Status post tubal ligation 11/21/2015   Postpartum    Patient Active Problem List   Diagnosis Date Noted  . 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 Status post tubal ligation 11/21/2015  . Postpartum care following vaginal delivery 11/20/2015  . Post term pregnancy at [redacted] weeks gestation 11/19/2015  . PTSD (post-traumatic  stress disorder)   . Depression   . Bipolar 1 disorder (HCC)   . Major depression 02/16/2013  . Alcohol abuse 02/16/2013  . Anxiety state, unspecified 02/16/2013    Past Surgical History:  Procedure Laterality Date  . MOUTH SURGERY    . TUBAL LIGATION Bilateral 11/21/2015   Procedure: POST PARTUM TUBAL LIGATION;  Surgeon: Conard Novak, MD;  Location: [facility] ORS;  Service: Gynecology;  Laterality: Bilateral;     OB History    Gravida  5   Para  4   Term  4   Preterm      AB  1   Living  4     SAB  1   TAB      Ectopic      Multiple  0   Live Births  4            Home Medications    Prior to Admission medications   Medication Sig Start Date End Date Taking? Authorizing Provider  ibuprofen (ADVIL,MOTRIN) 600 MG tablet Take 1 tablet (600 mg total) by mouth every 6 (six) hours as needed. Patient not taking: Reported on 05/30/2018 06/11/17   Elpidio Anis, PA-C    Family History Family History  Problem Relation Age of Onset  . Alcohol abuse Mother   . Depression Mother   . Heart attack Mother   . Alcohol abuse Father   . Diabetes Father  Social History Social History   Tobacco Use  . Smoking status: Current Every Day Smoker    Packs/day: 0.50    Types: Cigarettes  . Smokeless tobacco: Never Used  Substance Use Topics  . Alcohol use: Yes    Alcohol/week: 0.0 standard drinks    Comment: Patient endorses drinking occassionally. Amount unspecified   . Drug use: Yes    Types: Methamphetamines, Heroin, Amphetamines, Marijuana, Cocaine    Comment: heroin     Allergies   Patient has no known allergies.   Review of Systems Review of Systems  Psychiatric/Behavioral: Positive for hallucinations and suicidal ideas. The patient is nervous/anxious.      Physical Exam Updated Vital Signs BP (!) 137/92 (BP Location: Left Arm)   Pulse 90   Temp 98.2 F (36.8 C) (Oral)   Resp 20   LMP 04/26/2018   SpO2 99%   Physical Exam Vitals signs  and nursing note reviewed.  Constitutional:      General: She is not in acute distress.    Appearance: She is well-developed.  HENT:     Head: Normocephalic and atraumatic.  Neck:     Musculoskeletal: Normal range of motion.  Pulmonary:     Effort: Pulmonary effort is normal.  Abdominal:     General: There is no distension.  Musculoskeletal: Normal range of motion.  Skin:    General: Skin is warm.     Findings: No rash.  Neurological:     Mental Status: She is alert and oriented to person, place, and time.  Psychiatric:        Attention and Perception: She perceives auditory hallucinations.        Mood and Affect: Mood is anxious. Affect is tearful.        Speech: Speech is rapid and pressured.        Behavior: Behavior is hyperactive.        Thought Content: Thought content includes suicidal ideation.     Comments: Patient constantly moving and hyperactive during the evaluation.  Reporting suicidal thoughts and auditory hallucinations.  She is very anxious and tearful.      ED Treatments / Results  Labs (all labs ordered are listed, but only abnormal results are displayed) Labs Reviewed  CBC - Abnormal; Notable for the following components:      Result Value   WBC 10.8 (*)    All other components within normal limits  COMPREHENSIVE METABOLIC PANEL - Abnormal; Notable for the following components:   Potassium 3.3 (*)    Glucose, Bld 68 (*)    BUN 23 (*)    All other components within normal limits  RAPID URINE DRUG SCREEN, HOSP PERFORMED - Abnormal; Notable for the following components:   Opiates POSITIVE (*)    Cocaine POSITIVE (*)    Amphetamines POSITIVE (*)    Tetrahydrocannabinol POSITIVE (*)    All other components within normal limits  ACETAMINOPHEN LEVEL - Abnormal; Notable for the following components:   Acetaminophen (Tylenol), Serum <10 (*)    All other components within normal limits  VALPROIC ACID LEVEL - Abnormal; Notable for the following components:    Valproic Acid Lvl <10 (*)    All other components within normal limits  ETHANOL  SALICYLATE LEVEL  I-STAT BETA HCG BLOOD, ED ([facility], [facility], [facility] ONLY)    EKG None  Radiology No results found.  Procedures Procedures (including critical care time)  Medications Ordered in ED Medications  acetaminophen (TYLENOL) tablet 650 mg (has  no administration in time range)     Initial Impression / Assessment and Plan / ED Course  I have reviewed the triage vital signs and the nursing notes.  Pertinent labs & imaging results that were available during my care of the patient were reviewed by me and considered in my medical decision making (see chart for details).        Patient presenting for evaluation of suicidal thoughts.  She has been seen by behavioral health earlier today, however upon my talking to her, she appears to have increased SI.  Additionally, patient has evidence of self harm, she reports she is injuring herself causing bruises along her arms. As such, will order medical clearance labs and re consult the [facility] team.   Labs show polysubstance abuse, positive for opioids, cocaine, amphetamines, and marijuana.  Glucose 68, will give her a sandwich.  Otherwise, reassuring.  At this time, patient is medically cleared for behavioral health eval.  TTS consult placed.  Behavioral health team and peer support evaluated the patient.  Recommending inpatient treatment.  No home meds to reorder.   Final Clinical Impressions(s) / ED Diagnoses   Final diagnoses:  None    ED Discharge Orders    None       Alveria Apley, PA-C 05/30/18 2250    Alveria Apley, PA-C 05/30/18 2300    Alveria Apley, PA-C 05/30/18 2341    Lorre Nick, MD 06/05/18 [phone]

## 2018-05-30 NOTE — Progress Notes (Signed)
Per Donell Sievert, PA pt is recommended for inpt treatment. TTS to seek placement due to [facility] has no available 500 hall beds per [facility]. EDP Caccavale, Sophia, PA-C and pt's nurse Montey Hora, RN have been advised.  Princess Bruins, MSW, LCSW Therapeutic Triage Specialist  [phone]

## 2018-05-30 NOTE — ED Notes (Signed)
TTS at bedside. 

## 2018-05-30 NOTE — H&P (Signed)
Behavioral Health Medical Screening Exam  [name] is an 28 y.o. female presents to [facility] as walking with complaints of hearing voices tell her to kill herself.  Patient states that she would never kill herself because of her children she just needs to get her life together.  Patient seeking rehab service.  Patient given resources and referral for short and long term.  Patient denies suicidal/homicidal ideation, and paranoia.  States that she is aware that meth use can be the cause of her auditory hallucinations.   Total Time spent with patient: 30 minutes  Psychiatric Specialty Exam: Physical Exam  Vitals reviewed. Constitutional: She is oriented to person, place, and time. She appears well-developed and well-nourished.  Neck: Normal range of motion. Neck supple.  Respiratory: Effort normal.  Musculoskeletal: Normal range of motion.  Neurological: She is alert and oriented to person, place, and time.  Skin: Skin is warm and dry.  Psychiatric: Judgment and thought content normal. Her mood appears anxious. She is actively hallucinating. Cognition and memory are normal. She exhibits a depressed mood.    Review of Systems  Psychiatric/Behavioral: Positive for depression (Stable) and substance abuse ("Meth, crack, and weed"  Last use "8-12 hours ago"). Hallucinations: States that she is hearing voices telling her to kill herself but states that she would never do it because she has to be there for her children  Suicidal ideas: States that she is not going to kill herself "I need to get myself together for my kids" The patient is nervous/anxious.   All other systems reviewed and are negative.   currently breastfeeding.There is no height or weight on file to calculate BMI.  General Appearance: Casual  Eye Contact:  Good  Speech:  Clear and Coherent and Normal Rate  Volume:  Normal  Mood:  Anxious and Depressed  Affect:  Congruent  Thought Process:  Coherent and Goal Directed   Orientation:  Full (Time, Place, and Person)  Thought Content:  WDL  Suicidal Thoughts:  No  Homicidal Thoughts:  No  Memory:  Immediate;   Good Recent;   Good Remote;   Good  Judgement:  Fair  Insight:  Fair and Present  Psychomotor Activity:  Normal  Concentration: Concentration: Fair and Attention Span: Fair  Recall:  Good  Fund of Knowledge:Fair  Language: Good  Akathisia:  No  Handed:  Right  AIMS (if indicated):     Assets:  Communication Skills Desire for Improvement Housing Social Support  Sleep:       Musculoskeletal: Strength & Muscle Tone: within normal limits Gait & Station: normal Patient leans: N/A  currently breastfeeding.  Recommendations: Outpatient psychiatric services  Based on my evaluation the patient does not appear to have an emergency medical condition.   Disposition: No evidence of imminent risk to self or others at present.   Patient does not meet criteria for psychiatric inpatient admission. Supportive therapy provided about ongoing stressors. Discussed crisis plan, support from social network, calling 911, coming to the Emergency Department, and calling Suicide Hotline.  Shuvon Rankin, NP 05/30/2018, 6:24 PM

## 2018-05-31 ENCOUNTER — Inpatient Hospital Stay (HOSPITAL_COMMUNITY)
Admission: AD | Admit: 2018-05-31 | Discharge: 2018-06-03 | DRG: 885 | Disposition: A | Discharge status: Home / Self Care | Payer: No Typology Code available for payment source | Source: Intra-hospital | Attending: Psychiatry | Admitting: Psychiatry

## 2018-05-31 ENCOUNTER — Encounter (HOSPITAL_COMMUNITY): Payer: Self-pay

## 2018-05-31 DIAGNOSIS — R45851 Suicidal ideations: Secondary | ICD-10-CM | POA: Diagnosis present

## 2018-05-31 DIAGNOSIS — F4310 Post-traumatic stress disorder, unspecified: Secondary | ICD-10-CM | POA: Diagnosis present

## 2018-05-31 DIAGNOSIS — F322 Major depressive disorder, single episode, severe without psychotic features: Principal | ICD-10-CM | POA: Diagnosis present

## 2018-05-31 DIAGNOSIS — F1994 Other psychoactive substance use, unspecified with psychoactive substance-induced mood disorder: Secondary | ICD-10-CM | POA: Diagnosis present

## 2018-05-31 DIAGNOSIS — R41843 Psychomotor deficit: Secondary | ICD-10-CM | POA: Diagnosis present

## 2018-05-31 DIAGNOSIS — I10 Essential (primary) hypertension: Secondary | ICD-10-CM | POA: Diagnosis present

## 2018-05-31 DIAGNOSIS — F17210 Nicotine dependence, cigarettes, uncomplicated: Secondary | ICD-10-CM | POA: Diagnosis present

## 2018-05-31 DIAGNOSIS — F1120 Opioid dependence, uncomplicated: Secondary | ICD-10-CM | POA: Diagnosis present

## 2018-05-31 DIAGNOSIS — F419 Anxiety disorder, unspecified: Secondary | ICD-10-CM | POA: Diagnosis present

## 2018-05-31 DIAGNOSIS — Z9851 Tubal ligation status: Secondary | ICD-10-CM | POA: Diagnosis not present

## 2018-05-31 DIAGNOSIS — Z818 Family history of other mental and behavioral disorders: Secondary | ICD-10-CM | POA: Diagnosis not present

## 2018-05-31 DIAGNOSIS — F1910 Other psychoactive substance abuse, uncomplicated: Secondary | ICD-10-CM

## 2018-05-31 DIAGNOSIS — Z590 Homelessness: Secondary | ICD-10-CM | POA: Diagnosis not present

## 2018-05-31 DIAGNOSIS — F1210 Cannabis abuse, uncomplicated: Secondary | ICD-10-CM | POA: Diagnosis present

## 2018-05-31 DIAGNOSIS — Z811 Family history of alcohol abuse and dependence: Secondary | ICD-10-CM

## 2018-05-31 DIAGNOSIS — F1520 Other stimulant dependence, uncomplicated: Secondary | ICD-10-CM | POA: Diagnosis present

## 2018-05-31 DIAGNOSIS — F988 Other specified behavioral and emotional disorders with onset usually occurring in childhood and adolescence: Secondary | ICD-10-CM | POA: Diagnosis present

## 2018-05-31 DIAGNOSIS — F1524 Other stimulant dependence with stimulant-induced mood disorder: Secondary | ICD-10-CM | POA: Diagnosis not present

## 2018-05-31 HISTORY — DX: Inflammatory liver disease, unspecified: K75.9

## 2018-05-31 MED ORDER — CLONIDINE HCL 0.1 MG PO TABS
0.1000 mg | ORAL_TABLET | Freq: Four times a day (QID) | ORAL | Status: AC
  Administered 2018-05-31 (×3): 0.1 mg via ORAL
  Filled 2018-05-31 (×8): qty 1

## 2018-05-31 MED ORDER — CLONIDINE HCL 0.1 MG PO TABS
0.1000 mg | ORAL_TABLET | ORAL | Status: DC
  Filled 2018-05-31 (×4): qty 1

## 2018-05-31 MED ORDER — IBUPROFEN 200 MG PO TABS
600.0000 mg | ORAL_TABLET | Freq: Once | ORAL | Status: AC
  Administered 2018-05-31: 01:00:00 600 mg via ORAL
  Filled 2018-05-31: qty 3

## 2018-05-31 MED ORDER — NICOTINE 14 MG/24HR TD PT24
14.0000 mg | MEDICATED_PATCH | Freq: Every day | TRANSDERMAL | Status: DC
  Administered 2018-05-31 – 2018-06-03 (×4): 14 mg via TRANSDERMAL
  Filled 2018-05-31 (×6): qty 1

## 2018-05-31 MED ORDER — NAPROXEN 500 MG PO TABS
500.0000 mg | ORAL_TABLET | Freq: Two times a day (BID) | ORAL | Status: DC | PRN
  Administered 2018-05-31: 21:00:00 500 mg via ORAL
  Filled 2018-05-31: qty 1

## 2018-05-31 MED ORDER — ALUM & MAG HYDROXIDE-SIMETH 200-200-20 MG/5ML PO SUSP: 30.0000 mL | ORAL | Status: DC | PRN

## 2018-05-31 MED ORDER — LOPERAMIDE HCL 2 MG PO CAPS: 2.0000 mg | ORAL_CAPSULE | ORAL | Status: DC | PRN

## 2018-05-31 MED ORDER — FLUOXETINE HCL 20 MG PO CAPS
20.0000 mg | ORAL_CAPSULE | Freq: Every day | ORAL | Status: DC
  Administered 2018-05-31 – 2018-06-03 (×4): 20 mg via ORAL
  Filled 2018-05-31 (×5): qty 1

## 2018-05-31 MED ORDER — ENSURE ENLIVE PO LIQD
237.0000 mL | Freq: Two times a day (BID) | ORAL | Status: DC
  Administered 2018-05-31 – 2018-06-03 (×6): 237 mL via ORAL

## 2018-05-31 MED ORDER — TRAZODONE HCL 50 MG PO TABS
50.0000 mg | ORAL_TABLET | Freq: Every evening | ORAL | Status: DC | PRN
  Administered 2018-05-31: 50 mg via ORAL
  Filled 2018-05-31 (×4): qty 1

## 2018-05-31 MED ORDER — HYDROXYZINE HCL 25 MG PO TABS
25.0000 mg | ORAL_TABLET | Freq: Four times a day (QID) | ORAL | Status: DC | PRN
  Filled 2018-05-31 (×2): qty 1

## 2018-05-31 MED ORDER — ADULT MULTIVITAMIN W/MINERALS CH
1.0000 | ORAL_TABLET | Freq: Every day | ORAL | Status: DC
  Administered 2018-05-31 – 2018-06-03 (×4): 1 via ORAL
  Filled 2018-05-31 (×5): qty 1

## 2018-05-31 MED ORDER — METHOCARBAMOL 500 MG PO TABS
500.0000 mg | ORAL_TABLET | Freq: Three times a day (TID) | ORAL | Status: DC | PRN
  Administered 2018-05-31: 15:00:00 500 mg via ORAL
  Filled 2018-05-31: qty 1

## 2018-05-31 MED ORDER — DICYCLOMINE HCL 20 MG PO TABS: 20.0000 mg | ORAL_TABLET | Freq: Four times a day (QID) | ORAL | Status: DC | PRN

## 2018-05-31 MED ORDER — ACETAMINOPHEN 325 MG PO TABS: 650.0000 mg | ORAL_TABLET | Freq: Four times a day (QID) | ORAL | Status: DC | PRN

## 2018-05-31 MED ORDER — CLONIDINE HCL 0.1 MG PO TABS
0.1000 mg | ORAL_TABLET | Freq: Every day | ORAL | Status: DC
  Filled 2018-05-31: qty 1

## 2018-05-31 MED ORDER — ONDANSETRON 4 MG PO TBDP: 4.0000 mg | ORAL_TABLET | Freq: Four times a day (QID) | ORAL | Status: DC | PRN

## 2018-05-31 MED ORDER — MAGNESIUM HYDROXIDE 400 MG/5ML PO SUSP: 30.0000 mL | Freq: Every day | ORAL | Status: DC | PRN

## 2018-05-31 MED ORDER — HYDROXYZINE HCL 25 MG PO TABS
25.0000 mg | ORAL_TABLET | Freq: Four times a day (QID) | ORAL | Status: DC | PRN
  Administered 2018-05-31 (×2): 25 mg via ORAL
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
## 2018-05-31 NOTE — ED Provider Notes (Signed)
I was requested by nursing staff to evaluate the bilateral feet in a 28 year old female who had previously been seen by [facility] and had been recommended for inpatient behavioral health treatment.  She is about to be transferred to [facility], but nursing staff has requested evaluation prior to transport.  The patient is endorsing severe, constant bilateral foot pain to the soles of the feet.  She reports that she has been walking long distances only wearing socks.  She says she does not currently own a pair shoes.  She reports pain is maximal in the interdigital space in the bilateral feet.  She denies pain to the dorsum of the foot, bilateral lower legs, or in the region of the Achilles tendon. She was given Tylenol in the ER with minimal to no improvement in her pain.  I have offered the patient ibuprofen, and she states "Are you not going to give anything stronger than that?"  On exam, patient has 2+ DP and PT pulses.  Sensation is intact and equal throughout the bilateral lower extremities.  Good strength against resistance.  There is new callus formation to the soles of the bilateral feet.  No purulent discharge, erythema, warmth, or edema.  Bilateral Achilles tendons are intact.  There is no macerated skin or wounds in the interdigital space bilaterally.  She was given ibuprofen and is medically cleared for transport to [facility] at this time.   Frederik Pear A, PA-C 05/31/18 0155    Ward, Layla Maw, DO 05/31/18 1975

## 2018-05-31 NOTE — Progress Notes (Signed)
NUTRITION ASSESSMENT RD working remotely.   Pt identified as at risk on the Malnutrition Screen Tool  INTERVENTION: - weight patient today. - continue Ensure Enlive BID, each supplement provides 350 kcal and 20 grams of protein. - continue to encourage PO intakes.    NUTRITION DIAGNOSIS: Inadequate oral intake related to acute illness, social/environmental circumstances as evidenced by patient report.   Goal: Pt to meet >/= 90% of their estimated nutrition needs.  Monitor:  PO intake  Assessment:  Patient admitted d/t complaints of auditory hallucination and substance abuse. Patient denied SI. Patient had reported stressors include not having a support system, homelessness (x2 years), and missing her children. On admission, patient was positive for opiates and THC. Notes indicate patient reported pain when walking and that she has experienced falls but she is unsure of reason for falls.   No recent weight information available. Most recent recorded weight was at [facility] on 08/16/17 when she weighed 115 lb.    28 y.o. female  Height: Ht Readings from Last 1 Encounters:  05/24/17 5\' 4"  (1.626 m)    Weight: Wt Readings from Last 1 Encounters:  05/24/17 54.4 kg    Weight Hx: Wt Readings from Last 10 Encounters:  05/24/17 54.4 kg  01/19/17 52.2 kg  12/06/16 54.4 kg  10/26/16 52.2 kg  09/13/16 58.3 kg  11/26/15 74.8 kg  11/19/15 78 kg  11/04/15 75.3 kg  08/29/15 71.7 kg  03/01/15 72.6 kg    BMI:  There is no height or weight on file to calculate BMI.   Estimated Nutritional Needs: Kcal: 25-30 kcal/kg Protein: > 1 gram protein/kg Fluid: 1 ml/kcal  Diet Order:  Diet Order            Diet regular Room service appropriate? Yes; Fluid consistency: Thin  Diet effective now             Pt is also offered choice of unit snacks mid-morning and mid-afternoon.  Pt is eating as desired.   Lab results and medications reviewed.     Trenton Gammon,  MS, RD, LDN, [facility] Inpatient Clinical Dietitian Pager # [phone] After hours/weekend pager # [phone]

## 2018-05-31 NOTE — Tx Team (Signed)
Interdisciplinary Treatment and Diagnostic Plan Update  05/31/2018 Time of Session: 9:00am [name] MRN: [id number]  Principal Diagnosis: <principal problem not specified>  Secondary Diagnoses: Active Problems:   MDD (major depressive disorder), severe (HCC)   Current Medications:  Current Facility-Administered Medications  Medication Dose Route Frequency Provider Last Rate Last Dose  . acetaminophen (TYLENOL) tablet 650 mg  650 mg Oral Q6H PRN Laverle Hobby, PA-C      . alum & mag hydroxide-simeth (MAALOX/MYLANTA) 200-200-20 MG/5ML suspension 30 mL  30 mL Oral Q4H PRN Patriciaann Clan E, PA-C      . cloNIDine (CATAPRES) tablet 0.1 mg  0.1 mg Oral QID Patriciaann Clan E, PA-C       Followed by  . [START ON 06/02/2018] cloNIDine (CATAPRES) tablet 0.1 mg  0.1 mg Oral BH-qamhs Simon, Spencer E, PA-C       Followed by  . [START ON 06/04/2018] cloNIDine (CATAPRES) tablet 0.1 mg  0.1 mg Oral QAC breakfast Laverle Hobby, PA-C      . dicyclomine (BENTYL) tablet 20 mg  20 mg Oral Q6H PRN Patriciaann Clan E, PA-C      . feeding supplement (ENSURE ENLIVE) (ENSURE ENLIVE) liquid 237 mL  237 mL Oral BID BM Cobos, Myer Peer, MD      . hydrOXYzine (ATARAX/VISTARIL) tablet 25 mg  25 mg Oral Q6H PRN Laverle Hobby, PA-C      . hydrOXYzine (ATARAX/VISTARIL) tablet 25 mg  25 mg Oral Q6H PRN Laverle Hobby, PA-C      . loperamide (IMODIUM) capsule 2-4 mg  2-4 mg Oral PRN Laverle Hobby, PA-C      . magnesium hydroxide (MILK OF MAGNESIA) suspension 30 mL  30 mL Oral Daily PRN Laverle Hobby, PA-C      . methocarbamol (ROBAXIN) tablet 500 mg  500 mg Oral Q8H PRN Laverle Hobby, PA-C      . multivitamin with minerals tablet 1 tablet  1 tablet Oral Daily Cobos, Fernando A, MD      . naproxen (NAPROSYN) tablet 500 mg  500 mg Oral BID PRN Laverle Hobby, PA-C      . nicotine (NICODERM CQ - dosed in mg/24 hours) patch 14 mg  14 mg Transdermal Daily Cobos, Fernando A, MD      . ondansetron  (ZOFRAN-ODT) disintegrating tablet 4 mg  4 mg Oral Q6H PRN Laverle Hobby, PA-C      . traZODone (DESYREL) tablet 50 mg  50 mg Oral QHS,MR X 1 Simon, Spencer E, PA-C       PTA Medications: Medications Prior to Admission  Medication Sig Dispense Refill Last Dose  . ibuprofen (ADVIL,MOTRIN) 600 MG tablet Take 1 tablet (600 mg total) by mouth every 6 (six) hours as needed. (Patient not taking: Reported on 05/30/2018) 30 tablet 0 Not Taking at Unknown time    Patient Stressors: Financial difficulties Marital or family conflict Substance abuse  Patient Strengths: Ability for insight General fund of knowledge Motivation for treatment/growth  Treatment Modalities: Medication Management, Group therapy, Case management,  1 to 1 session with clinician, Psychoeducation, Recreational therapy.   Physician Treatment Plan for Primary Diagnosis: <principal problem not specified> Long Term Goal(s):     Short Term Goals:    Medication Management: Evaluate patient's response, side effects, and tolerance of medication regimen.  Therapeutic Interventions: 1 to 1 sessions, Unit Group sessions and Medication administration.  Evaluation of Outcomes: Not Met  Physician Treatment Plan for Secondary  Diagnosis: Active Problems:   MDD (major depressive disorder), severe ([place])  Long Term Goal(s):     Short Term Goals:       Medication Management: Evaluate patient's response, side effects, and tolerance of medication regimen.  Therapeutic Interventions: 1 to 1 sessions, Unit Group sessions and Medication administration.  Evaluation of Outcomes: Not Met   RN Treatment Plan for Primary Diagnosis: <principal problem not specified> Long Term Goal(s): Knowledge of disease and therapeutic regimen to maintain health will improve  Short Term Goals: Ability to demonstrate self-control, Ability to participate in decision making will improve, Ability to identify and develop effective coping behaviors will  improve and Compliance with prescribed medications will improve  Medication Management: RN will administer medications as ordered by provider, will assess and evaluate patient's response and provide education to patient for prescribed medication. RN will report any adverse and/or side effects to prescribing provider.  Therapeutic Interventions: 1 on 1 counseling sessions, Psychoeducation, Medication administration, Evaluate responses to treatment, Monitor vital signs and CBGs as ordered, Perform/monitor CIWA, COWS, AIMS and Fall Risk screenings as ordered, Perform wound care treatments as ordered.  Evaluation of Outcomes: Not Met   LCSW Treatment Plan for Primary Diagnosis: <principal problem not specified> Long Term Goal(s): Safe transition to appropriate next level of care at discharge, Engage patient in therapeutic group addressing interpersonal concerns.  Short Term Goals: Engage patient in aftercare planning with referrals and resources, Increase social support, Identify triggers associated with mental health/substance abuse issues and Increase skills for wellness and recovery  Therapeutic Interventions: Assess for all discharge needs, 1 to 1 time with Social worker, Explore available resources and support systems, Assess for adequacy in community support network, Educate family and significant other(s) on suicide prevention, Complete Psychosocial Assessment, Interpersonal group therapy.  Evaluation of Outcomes: Not Met   Progress in Treatment: Attending groups: No. New to unit. Participating in groups: No. Taking medication as prescribed: Yes. Toleration medication: Yes. Family/Significant other contact made: No, will contact:  supports if consents are granted. Patient understands diagnosis: Yes. Discussing patient identified problems/goals with staff: Yes. Medical problems stabilized or resolved: No. Denies suicidal/homicidal ideation: Yes. Issues/concerns per patient  self-inventory: Yes.  New problem(s) identified: Yes, Describe:  legal issues, limited social supports, no income  New Short Term/Long Term Goal(s): detox, medication management for mood stabilization; elimination of SI thoughts; development of comprehensive mental wellness/sobriety plan.  Patient Goals: "I want to get myself together."  Discharge Plan or Barriers: CSW continuing to assess for appropriate referrals.   Reason for Continuation of Hospitalization: Anxiety Depression Suicidal ideation Withdrawal symptoms  Estimated Length of Stay: 3-5 days  Attendees: Patient: [name] 05/31/2018 9:03 AM  Physician:  05/31/2018 9:03 AM  Nursing:  05/31/2018 9:03 AM  RN Care Manager: 05/31/2018 9:03 AM  Social Worker: Stephanie Acre, [place] 05/31/2018 9:03 AM  Recreational Therapist:  05/31/2018 9:03 AM  Other:  05/31/2018 9:03 AM  Other:  05/31/2018 9:03 AM  Other: 05/31/2018 9:03 AM    Scribe for Treatment Team: Joellen Jersey, [place] 05/31/2018 9:03 AM

## 2018-05-31 NOTE — Tx Team (Signed)
Initial Treatment Plan 05/31/2018 2:43 AM [name] CBJ:[id number]    PATIENT STRESSORS: Financial difficulties Marital or family conflict Substance abuse   PATIENT STRENGTHS: Ability for insight General fund of knowledge Motivation for treatment/growth   PATIENT IDENTIFIED PROBLEMS: "I want my mind to be stable" "I need coping skills" "subtance abuse" "I have no support"                     DISCHARGE CRITERIA:  Need for constant or close observation no longer present Reduction of life-threatening or endangering symptoms to within safe limits  PRELIMINARY DISCHARGE PLAN: Placement in alternative living arrangements  PATIENT/FAMILY INVOLVEMENT: This treatment plan has been presented to and reviewed with the patient, [name].  The patient and family have been given the opportunity to ask questions and make suggestions.  Edwyna Perfect, RN 05/31/2018, 2:43 AM

## 2018-05-31 NOTE — Progress Notes (Signed)
Pt accepted to [facility] 304-1 to Dr. Jama Flavors, MD. Call to report [phone]. Tresa Endo, RN has been advised. Bed is ready now.  Princess Bruins, MSW, LCSW Therapeutic Triage Specialist  [phone]
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
## 2018-05-31 NOTE — Progress Notes (Signed)
Patient has a screening at [facility] for Tuesday, 06/04/2018.  She will need a 28 day supply of medication.  She will need to discharge by 7:00am on 06/04/2018 in order to arrive by 7:45am for her screening.  Enid Cutter, LCSW-A Clinical Social Worker

## 2018-05-31 NOTE — Progress Notes (Signed)
CSW attempted to complete PSA with patient, patient requested CSW come back at a later time, so that patient can sleep. CSW will follow up with patient later today.  Enid Cutter, LCSW-A Clinical Social Worker
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
## 2018-06-01 DIAGNOSIS — F1520 Other stimulant dependence, uncomplicated: Secondary | ICD-10-CM

## 2018-06-01 DIAGNOSIS — F1524 Other stimulant dependence with stimulant-induced mood disorder: Secondary | ICD-10-CM

## 2018-06-01 LAB — GLUCOSE, CAPILLARY: Glucose-Capillary: 101 mg/dL — ABNORMAL HIGH (ref 70–99)

## 2018-06-01 MED ORDER — POTASSIUM CHLORIDE CRYS ER 20 MEQ PO TBCR
20.0000 meq | EXTENDED_RELEASE_TABLET | Freq: Two times a day (BID) | ORAL | Status: AC
  Administered 2018-06-01 – 2018-06-02 (×2): 20 meq via ORAL
  Filled 2018-06-01 (×2): qty 1

## 2018-06-01 MED ORDER — HYDROXYZINE HCL 25 MG PO TABS
25.0000 mg | ORAL_TABLET | Freq: Four times a day (QID) | ORAL | Status: DC | PRN
  Administered 2018-06-01 – 2018-06-02 (×2): 25 mg via ORAL
  Filled 2018-06-01 (×2): qty 1

## 2018-06-01 NOTE — Progress Notes (Signed)
D: Pt was in the dayroom upon initial approach.  Pt presents with anxious, depressed affect and mood.  She describes her day as "rough."  Her goal is "decision making."  When asked to elaborate, she expressed ambivalence regarding her stay at [facility].  She states she has felt "uneasy" since signing her AMA discharge request earlier today and pt rescinded her request when she was informed that she can do so.  She also reports goal to "start making more positive decisions, go with my gut."  Pt denies SI/HI, denies hallucinations, denies pain.  Pt has been visible in milieu interacting with peers and staff cautiously.    A: Introduced self to pt.  Met with pt 1:1.  Actively listened to pt and offered support and encouragement.  Scheduled Clonidine was held after discussing pt's complaints of dizziness earlier today with on-site provider.  Pt requests medication for anxiety and on-site provider notified.  PRN medication for anxiety was ordered and administered.  PO fluids encouraged.  Fall prevention techniques reviewed with pt and she verbalized understanding.  Q15 minute safety checks maintained.  R: Pt is safe on the unit.  Pt is compliant with medication.  Pt verbally contracts for safety.  Will continue to monitor and assess.

## 2018-06-01 NOTE — [facility] Group Notes (Addendum)
LCSW Group Therapy Note  06/01/2018    10:00-11:00am   Type of Therapy and Topic:  Group Therapy: Early Messages Received About Anger  Participation Level:  Did Not Attend   Description of Group:   In this group, patients shared and discussed the early messages received in their lives about anger through parental or other adult modeling, teaching, repression, punishment, violence, and more.  Participants identified how those childhood lessons influence even now how they usually or often react when angered.  The group discussed that anger is a secondary emotion and what may be the underlying emotional themes that come out through anger outbursts or that are ignored through anger suppression.  Finally, as a group there was a conversation about the workbook's quote that "There is nothing wrong with anger; it is just a sign something needs to change."     Therapeutic Goals: 1. Patients will identify one or more childhood message about anger that they received and how it was taught to them. 2. Patients will discuss how these childhood experiences have influenced and continue to influence their own expression or repression of anger even today. 3. Patients will explore possible primary emotions that tend to fuel their secondary emotion of anger. 4. Patients will learn that anger itself is normal and cannot be eliminated, and that healthier coping skills can assist with resolving conflict rather than worsening situations.  Summary of Patient Progress:  Did not attend Therapeutic Modalities:   Cognitive Behavioral Therapy Motivation Interviewing  [name]  

## 2018-06-01 NOTE — Progress Notes (Signed)
D: Pt was in the hallway upon initial approach.  Pt presents with anxious, depressed affect and mood.  She reports she feels "light headed" and Probation officer encouraged pt to sit or lay down.  Her goal is to "get my kids back."  She complains of withdrawal symptoms of "chills."  Pt denies SI/HI, denies hallucinations, reports tongue pain of 8/10.  Pt has been visible in milieu interacting with peers and staff cautiously.    A: Introduced self to pt.  Met with pt 1:1.  Actively listened to pt and offered support and encouragement.  Medications administered per order.  PRN medication administered for pain and anxiety.  PO fluids encouraged and pt provided with pitcher of Gatorade.  Fall prevention techniques reviewed with pt and she verbalized understanding.  Q15 minute safety checks maintained.  R: Pt is safe on the unit.  Pt is compliant with medications.  Pt verbally contracts for safety.  Will continue to monitor and assess.

## 2018-06-01 NOTE — [facility] Group Notes (Signed)
[facility] Group Notes:  (Nursing/)  Date:  06/01/2018  Time:  7:46 PM  Type of Therapy:  Nurse Education  Participation Level:  Active  Participation Quality:  Appropriate  Affect:  Appropriate  Cognitive:  Appropriate  Insight:  Appropriate  Engagement in Group:  Engaged  Modes of Intervention:  Education  Summary of Progress/Problems: Anger Management  [name] 06/01/2018, 7:46 PM

## 2018-06-01 NOTE — Progress Notes (Signed)
Patient signed 72 hr request for discharge today 06/01/18 at 1822. She was tremulous and upset, and felt that she didn't want further treatment. She did not indicate what brought this on. I educated her on the 72hr request for discharge.

## 2018-06-01 NOTE — Plan of Care (Signed)
D: Patient presents depressed, tearful. She was lethargic this morning and c/o dizziness. She was fairly hypotensive. I encouraged fluids (gatorade) and instructed in safety precautions. BP did improve through the day. I was instructed by Cobos, MD to hold clonidine today. Patient denies SI/HI/AVH. She did sign a 72 hr request for discharge (see note).   A: Patient checked q15 min, and checks reviewed. Reviewed medication changes with patient and educated on side effects. Educated patient on importance of attending group therapy sessions and educated on several coping skills. Encouarged participation in milieu through recreation therapy and attending meals with peers. Support and encouragement provided. Fluids offered. R: Patient receptive to education on medications, and is medication compliant. Patient contracts for safety on the unit.  Problem: Education: Goal: Emotional status will improve Outcome: Not Progressing Goal: Mental status will improve Outcome: Not Progressing   Problem: Activity: Goal: Interest or engagement in activities will improve Outcome: Not Progressing Goal: Sleeping patterns will improve Outcome: Not Progressing

## 2018-06-01 NOTE — Progress Notes (Signed)
   06/01/18 0427  COVID-19 Daily Checkoff  Have you had a fever (temp > 37.80C/100F)  in the past 24 hours?  No  COVID-19 EXPOSURE  Have you traveled outside the state in the past 14 days? No  Have you been in contact with someone with a confirmed diagnosis of COVID-19 or PUI in the past 14 days without wearing appropriate PPE? No  Have you been living in the same home as a person with confirmed diagnosis of COVID-19 or a PUI (household contact)? No  Have you been diagnosed with COVID-19? No

## 2018-06-01 NOTE — Progress Notes (Addendum)
[facility] MD Progress Note  06/01/2018 9:34 AM [name]  MRN:  [id number] Subjective: Patient reports she is feeling better.  Describes feeling drowsy/sleepy.  Does not endorse other medication side effects.  Today denies suicidal ideations.  Objective: I have reviewed chart notes and have met with patient.  28 year old female, currently homeless, has 4 children whom she reports are in foster care at this time.  Presented voluntarily reporting depression, suicidal thoughts, auditory hallucinations.  History of substance abuse, describes methamphetamine as substance of choice.  Admission UDS was positive for cocaine, cannabis, opiates, amphetamines.  Today patient presents drowsy, although alertable by calling her name and able to cooperate with interview.  She is oriented to month, year, place.  States she is feeling better than she was prior to admission and today denies suicidal ideations. Currently does not endorse significant withdrawal symptoms and does not appear to be in any acute distress. She has reported dizziness and sedation.  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 ADD (attention deficit disorder)   . Anxiety   . Bipolar 1 disorder ([place])   . Depression   . HA (headache)   . Hepatitis   . Hypertension   . PTSD (post-traumatic stress disorder)   . Status post tubal ligation 11/21/2015   Postpartum    Past Surgical History:  Procedure Laterality Date  . MOUTH SURGERY    . TUBAL LIGATION Bilateral 11/21/2015   Procedure: POST PARTUM TUBAL LIGATION;  Surgeon: Will Bonnet, MD;  Location: [facility] ORS;  Service: Gynecology;  Laterality: Bilateral;   Family History:  Family History  Problem Relation Age of Onset  .  Alcohol abuse Mother   . Depression Mother   . Heart attack Mother   . Alcohol abuse Father   . Diabetes Father    Family Psychiatric  History:  Social History:  Social History   Substance and Sexual Activity  Alcohol Use Yes  . Alcohol/week: 0.0 standard drinks   Comment: Patient endorses drinking occassionally. Amount unspecified      Social History   Substance and Sexual Activity  Drug Use Yes  . Types: Methamphetamines, Heroin, Amphetamines, Marijuana, Cocaine   Comment: heroin    Social History   Socioeconomic History  . Marital status: Single    Spouse name: Not on file  . Number of children: 2  . Years of education: 10 th  . Highest education level: Not on file  Occupational History  . Not on file  Social Needs  . Financial resource strain: Not on file  . Food insecurity:    Worry: Not on file    Inability: Not on file  . Transportation needs:    Medical: Not on file    Non-medical: Not on file  Tobacco Use  . Smoking status: Current Every Day Smoker    Packs/day: 0.50    Types: Cigarettes  . Smokeless tobacco: Never Used  Substance and Sexual Activity  . Alcohol use: Yes    Alcohol/week: 0.0 standard drinks    Comment: Patient endorses drinking occassionally. Amount unspecified   . Drug use: Yes    Types: Methamphetamines, Heroin, Amphetamines, Marijuana, Cocaine    Comment: heroin  . Sexual activity: Yes    Birth control/protection: Condom, Surgical  Lifestyle  . Physical activity:    Days per week: Not on file    Minutes per session: Not on file  . Stress: Not on file  Relationships  . Social connections:    Talks on phone: Not on file    Gets together: Not on file    Attends religious service: Not on file    Active member of club or organization: Not on file    Attends meetings of clubs or organizations: Not on file    Relationship status: Not on file  Other Topics Concern  . Not on file  Social History Narrative   Patient lives at home  with friends and she is single.   Unemployed.   Education 10 th grade.   Right handed.   Caffeine Mountain dew and pepsi five or more cups daily.   Additional Social History:   Sleep: Good  Appetite:  Fair  Current Medications: Current Facility-Administered Medications  Medication Dose Route Frequency Provider Last Rate Last Dose  . acetaminophen (TYLENOL) tablet 650 mg  650 mg Oral Q6H PRN Laverle Hobby, PA-C      . alum & mag hydroxide-simeth (MAALOX/MYLANTA) 200-200-20 MG/5ML suspension 30 mL  30 mL Oral Q4H PRN Laverle Hobby, PA-C      . cloNIDine (CATAPRES) tablet 0.1 mg  0.1 mg Oral QID Patriciaann Clan E, PA-C   0.1 mg at 05/31/18 2126   Followed by  . [START ON 06/02/2018] cloNIDine (CATAPRES) tablet 0.1 mg  0.1 mg Oral BH-qamhs Simon, Spencer E, PA-C       Followed by  . [START ON 06/04/2018] cloNIDine (CATAPRES) tablet 0.1 mg  0.1 mg Oral QAC breakfast Laverle Hobby, PA-C      . dicyclomine (BENTYL) tablet 20 mg  20 mg Oral Q6H PRN Patriciaann Clan E, PA-C      . feeding supplement (ENSURE ENLIVE) (ENSURE ENLIVE) liquid 237 mL  237 mL Oral BID BM Havanah Nelms, Myer Peer, MD   237 mL at 05/31/18 1443  . FLUoxetine (PROZAC) capsule 20 mg  20 mg Oral Daily Johnn Hai, MD   20 mg at 05/31/18 1302  . hydrOXYzine (ATARAX/VISTARIL) tablet 25 mg  25 mg Oral Q6H PRN Laverle Hobby, PA-C      . hydrOXYzine (ATARAX/VISTARIL) tablet 25 mg  25 mg Oral Q6H PRN Laverle Hobby, PA-C   25 mg at 05/31/18 2130  . loperamide (IMODIUM) capsule 2-4 mg  2-4 mg Oral PRN Patriciaann Clan E, PA-C      . magnesium hydroxide (MILK OF MAGNESIA) suspension 30 mL  30 mL Oral Daily PRN Laverle Hobby, PA-C      . methocarbamol (ROBAXIN) tablet 500 mg  500 mg Oral Q8H PRN Patriciaann Clan E, PA-C   500 mg at 05/31/18 1454  . multivitamin with minerals tablet 1 tablet  1 tablet Oral Daily Odie Rauen, Myer Peer, MD   1 tablet at 05/31/18 1302  . naproxen (NAPROSYN) tablet 500 mg  500 mg Oral BID PRN Laverle Hobby, PA-C   500 mg at 05/31/18 2050  . nicotine (NICODERM CQ - dosed in mg/24 hours) patch 14 mg  14 mg Transdermal Daily Ingrid Shifrin, Myer Peer, MD   14 mg at 05/31/18 1302  . ondansetron (ZOFRAN-ODT) disintegrating tablet 4 mg  4 mg Oral Q6H PRN Laverle Hobby, PA-C      . traZODone (DESYREL) tablet 50 mg  50 mg Oral QHS,MR X 1 Laverle Hobby, PA-C  50 mg at 05/31/18 2126    Lab Results:  Results for orders placed or performed during the hospital encounter of 05/30/18 (from the past 48 hour(s))  CBC     Status: Abnormal   Collection Time: 05/30/18  9:01 PM  Result Value Ref Range   WBC 10.8 (H) 4.0 - 10.5 K/uL   RBC 4.34 3.87 - 5.11 MIL/uL   Hemoglobin 14.0 12.0 - 15.0 g/dL   HCT 42.2 36.0 - 46.0 %   MCV 97.2 80.0 - 100.0 fL   MCH 32.3 26.0 - 34.0 pg   MCHC 33.2 30.0 - 36.0 g/dL   RDW 13.0 11.5 - 15.5 %   Platelets 218 150 - 400 K/uL   nRBC 0.0 0.0 - 0.2 %    Comment: Performed at [facility], [place] [address] [place], [place] [address]  Comprehensive metabolic panel     Status: Abnormal   Collection Time: 05/30/18  9:01 PM  Result Value Ref Range   Sodium 139 135 - 145 mmol/L   Potassium 3.3 (L) 3.5 - 5.1 mmol/L   Chloride 105 98 - 111 mmol/L   CO2 22 22 - 32 mmol/L   Glucose, Bld 68 (L) 70 - 99 mg/dL   BUN 23 (H) 6 - 20 mg/dL   Creatinine, Ser 0.86 0.44 - 1.00 mg/dL   Calcium 9.1 8.9 - 10.3 mg/dL   Total Protein 7.9 6.5 - 8.1 g/dL   Albumin 4.5 3.5 - 5.0 g/dL   AST 36 15 - 41 U/L   ALT 36 0 - 44 U/L   Alkaline Phosphatase 48 38 - 126 U/L   Total Bilirubin 1.0 0.3 - 1.2 mg/dL   GFR calc non Af Amer >60 >60 mL/min   GFR calc Af Amer >60 >60 mL/min   Anion gap 12 5 - 15    Comment: Performed at [facility], [place] [address], [place], [place] [address]  Rapid urine drug screen (hospital performed)     Status: Abnormal   Collection Time: 05/30/18  9:01 PM  Result Value Ref Range   Opiates POSITIVE (A) NONE DETECTED   Cocaine  POSITIVE (A) NONE DETECTED   Benzodiazepines NONE DETECTED NONE DETECTED   Amphetamines POSITIVE (A) NONE DETECTED   Tetrahydrocannabinol POSITIVE (A) NONE DETECTED   Barbiturates NONE DETECTED NONE DETECTED    Comment: (NOTE) DRUG SCREEN FOR MEDICAL PURPOSES ONLY.  IF CONFIRMATION IS NEEDED FOR ANY PURPOSE, NOTIFY LAB WITHIN 5 DAYS. LOWEST DETECTABLE LIMITS FOR URINE DRUG SCREEN Drug Class                     Cutoff (ng/mL) Amphetamine and metabolites    1000 Barbiturate and metabolites    200 Benzodiazepine                 924 Tricyclics and metabolites     300 Opiates and metabolites        300 Cocaine and metabolites        300 THC                            50 Performed at [facility], [place] [address], [place],  [address]   Ethanol     Status: None   Collection Time: 05/30/18  9:01 PM  Result Value Ref Range   Alcohol, Ethyl (B) <10 <10 mg/dL    Comment: (NOTE) Lowest detectable limit  for serum alcohol is 10 mg/dL. For medical purposes only. Performed at [facility], [place] [address], [place], [place] [address]   Salicylate level     Status: None   Collection Time: 05/30/18  9:01 PM  Result Value Ref Range   Salicylate Lvl <8.5 2.8 - 30.0 mg/dL    Comment: Performed at [facility], [place] [address], [place], [place] [address]  Acetaminophen level     Status: Abnormal   Collection Time: 05/30/18  9:01 PM  Result Value Ref Range   Acetaminophen (Tylenol), Serum <10 (L) 10 - 30 ug/mL    Comment: (NOTE) Therapeutic concentrations vary significantly. A range of 10-30 ug/mL  may be an effective concentration for many patients. However, some  are best treated at concentrations outside of this range. Acetaminophen concentrations >150 ug/mL at 4 hours after ingestion  and >50 ug/mL at 12 hours after ingestion are often associated with  toxic reactions. Performed at [facility], [place]  [address] [place], [place] [address]   Valproic acid level     Status: Abnormal   Collection Time: 05/30/18  9:01 PM  Result Value Ref Range   Valproic Acid Lvl <10 (L) 50.0 - 100.0 ug/mL    Comment: RESULTS CONFIRMED BY MANUAL DILUTION Performed at [place] [address], [place],  [address]   I-Stat Beta hCG blood, ED ([facility], [facility], [facility] only)     Status: None   Collection Time: 05/30/18  9:05 PM  Result Value Ref Range   I-stat hCG, quantitative <5.0 <5 mIU/mL   Comment 3            Comment:   GEST. AGE      CONC.  (mIU/mL)   <=1 WEEK        5 - 50     2 WEEKS       50 - 500     3 WEEKS       100 - 10,000     4 WEEKS     1,000 - 30,000        FEMALE AND NON-PREGNANT FEMALE:     LESS THAN 5 mIU/mL     Blood Alcohol level:  Lab Results  Component Value Date   ETH <10 05/30/2018   ETH <10 50/27/7412    Metabolic Disorder Labs: No results found for: HGBA1C, MPG No results found for: PROLACTIN No results found for: CHOL, TRIG, HDL, CHOLHDL, VLDL, LDLCALC  Physical Findings: AIMS: Facial and Oral Movements Muscles of Facial Expression: None, normal Lips and Perioral Area: None, normal Jaw: None, normal Tongue: None, normal,Extremity Movements Upper (arms, wrists, hands, fingers): None, normal Lower (legs, knees, ankles, toes): None, normal, Trunk Movements Neck, shoulders, hips: None, normal, Overall Severity Severity of abnormal movements (highest score from questions above): None, normal Incapacitation due to abnormal movements: None, normal Patient's awareness of abnormal movements (rate only patient's report): No Awareness, Dental Status Current problems with teeth and/or dentures?: No Does patient usually wear dentures?: No  CIWA:    COWS:  COWS Total Score: 4  Musculoskeletal: Strength & Muscle Tone: within normal limits Gait & Station: normal Patient leans: N/A  Psychiatric Specialty Exam: Physical Exam  ROS denies headache or  chest pain, no shortness of breath, no coughing , no fever  Blood pressure (!) 81/51, pulse 81, temperature 98.2 F (36.8 C), temperature source Oral, resp. rate 20, currently breastfeeding.There is no height or weight on file  to calculate BMI.  General Appearance: Fairly Groomed  Eye Contact:  Fair  Speech:  Normal Rate  Volume:  Decreased  Mood:  Depressed- states feeling better today than prior to admission  Affect:  blunted, smiles briefly at times   Thought Process:  Linear and Descriptions of Associations: Intact  Orientation:  Other:  sedated but alertable on calling her name, oriented to month, day of week , but not date, oriented to [facility]  Thought Content:  denies hallucinations and does not appear internally preoccupied, no delusions expressed   Suicidal Thoughts:  No currently denies suicidal or self injurious ideations  Homicidal Thoughts:  No  Memory:  recent and remote grossly intact   Judgement:  Fair  Insight:  Fair  Psychomotor Activity:  Decreased  Concentration:  Concentration: Fair and Attention Span: Fair  Recall:  [employer] of Knowledge:  Fair  Language:  Fair  Akathisia:  Negative  Handed:  Right  AIMS (if indicated):     Assets:  Desire for Improvement Resilience  ADL's:  Intact  Cognition:  WNL  Sleep:  Number of Hours: 6.75    Assessment -  28 year old female, currently homeless, has 4 children whom she reports are in foster care at this time.  Presented voluntarily reporting depression, suicidal thoughts, auditory hallucinations.  History of substance abuse, describes methamphetamine as substance of choice.  Admission UDS was positive for cocaine, cannabis, opiates, amphetamines.  Patient presents drowsy but alertable on calling her name. At this time remains depressed but states mood is better than it was prior to admission and currently denies SI. Denies medication side effects other than sedation. BP has been trending low/ no tachycardia.  Treatment  Plan Summary: Daily contact with patient to assess and evaluate symptoms and progress in treatment, Medication management, Plan inpatient treatment  and medications as below Encourage grou and milieu participation to work on coping skills and symptom reduction Encourage efforts to work on abstinence  and relapse prevention  Treatment team working on disposition planning options  Continue Prozac 20 mgrs QDAY for depression Continue Clonidine detox protocol for opiate WDL - hold for SBP <90 D/C Trazodone  KDUR 20 mEq BID x 2 doses for hypokalemia  Encourage PO fluids  Will check CBG and  recheck BMP in AM Jenne Campus, MD 06/01/2018, 9:34 AM

## 2018-06-01 NOTE — Progress Notes (Signed)
Adult Psychoeducational Group Note  Date:  06/01/2018 Time:  12:47 AM  Group Topic/Focus:  Wrap-Up Group:   The focus of this group is to help patients review their daily goal of treatment and discuss progress on daily workbooks.  Participation Level:  Minimal  Participation Quality:  Drowsy  Affect:  Appropriate  Cognitive:  Appropriate  Insight: Limited  Engagement in Group:  Limited  Modes of Intervention:  Limit-setting  Additional Comments:  Pt said her day was a 6. The one positive thing that happen to her she was able to talk to her family.  Charna Busman Long 06/01/2018, 12:47 AM

## 2018-06-02 LAB — BASIC METABOLIC PANEL
Anion gap: 6 (ref 5–15)
BUN: 19 mg/dL (ref 6–20)
CO2: 26 mmol/L (ref 22–32)
Calcium: 8.6 mg/dL — ABNORMAL LOW (ref 8.9–10.3)
Chloride: 107 mmol/L (ref 98–111)
Creatinine, Ser: 0.73 mg/dL (ref 0.44–1.00)
GFR calc Af Amer: >60 mL/min (ref >60)
GFR calc non Af Amer: >60 mL/min (ref >60)
Glucose, Bld: 88 mg/dL (ref 70–99)
Potassium: 3.8 mmol/L (ref 3.5–5.1)
Sodium: 139 mmol/L (ref 135–145)

## 2018-06-02 NOTE — Progress Notes (Signed)
   06/02/18 0002  COVID-19 Daily Checkoff  Have you had a fever (temp > 37.80C/100F)  in the past 24 hours?  No  COVID-19 EXPOSURE  Have you traveled outside the state in the past 14 days? No  Have you been in contact with someone with a confirmed diagnosis of COVID-19 or PUI in the past 14 days without wearing appropriate PPE? No  Have you been living in the same home as a person with confirmed diagnosis of COVID-19 or a PUI (household contact)? No  Have you been diagnosed with COVID-19? No   

## 2018-06-02 NOTE — Progress Notes (Signed)
Patient requested and signed a 72 hr request for discharge today 06/02/18 at 1502. This is her second request, as she rescinded her first.

## 2018-06-02 NOTE — [facility] Group Notes (Signed)
Pt was invited but did not attend orientation/goals group. 

## 2018-06-02 NOTE — Plan of Care (Signed)
D: Patient presents lethargic, isolative. She takes naps during the day, and was difficult to wake up this morning. Her blood pressure has improved since yesterday, and she continues to take in PO fluids. She rescinded her 72 hr request for discharge last night. Patient denies SI/HI/AVH.  A: Patient checked q15 min, and checks reviewed. Reviewed medication changes with patient and educated on side effects. Educated patient on importance of attending group therapy sessions and educated on several coping skills. Encouarged participation in milieu through recreation therapy and attending meals with peers. Support and encouragement provided. Fluids offered. R: Patient receptive to education on medications, and is medication compliant. Patient contracts for safety on the unit.  Problem: Education: Goal: Emotional status will improve Outcome: Not Progressing Goal: Mental status will improve Outcome: Not Progressing   Problem: Activity: Goal: Interest or engagement in activities will improve Outcome: Not Progressing Goal: Sleeping patterns will improve Outcome: Not Progressing

## 2018-06-02 NOTE — Progress Notes (Signed)
D.  Pt pleasant on approach, no complaints voiced.  Pt was positive for evening wrap up group, observed engaged in appropriate interaction with peers on the unit.  Pt denies SI/HI/AVH at this time.  A.  Support and encouragement offered, medication given as ordered  R.  Pt remains safe on the unit, will continue to monitor.   

## 2018-06-02 NOTE — [facility] Group Notes (Signed)
[facility] LCSW Group Therapy Note  Date/Time:  06/02/2018 9:00-10:00 or 10:00-11:00AM  Type of Therapy and Topic:  Group Therapy:  Healthy and Unhealthy Supports  Participation Level:  Did Not Attend   Description of Group:  Patients in this group were introduced to the idea of adding a variety of healthy supports to address the various needs in their lives.Patients discussed what additional healthy supports could be helpful in their recovery and wellness after discharge in order to prevent future hospitalizations.   An emphasis was placed on using counselor, doctor, therapy groups, 12-step groups, and problem-specific support groups to expand supports.  They also worked as a group on developing a specific plan for several patients to deal with unhealthy supports through boundary-setting, psychoeducation with loved ones, and even termination of relationships.   Therapeutic Goals:   1)  discuss importance of adding supports to stay well once out of the hospital  2)  compare healthy versus unhealthy supports and identify some examples of each  3)  generate ideas and descriptions of healthy supports that can be added  4)  offer mutual support about how to address unhealthy supports  5)  encourage active participation in and adherence to discharge plan    Summary of Patient Progress:  Did not attend  Therapeutic Modalities:   Motivational Interviewing Brief Solution-Focused Therapy  Anh Mangano D Katiya Fike         

## 2018-06-02 NOTE — Progress Notes (Signed)
[facility] MD Progress Note  06/02/2018 9:46 AM [name]  MRN:  [id number] Subjective: patient reports gradual improvement and states she is feeling better . Reports symptoms of withdrawal have subsided . Currently denies medication side effects.  Objective: I have reviewed chart notes and have met with patient.  28 year old female, currently homeless, has 4 children whom she reports are in foster care at this time.  Presented voluntarily reporting depression, suicidal thoughts, auditory hallucinations.  History of substance abuse, describes methamphetamine as substance of choice.  Admission UDS was positive for cocaine, cannabis, opiates, amphetamines.  Patient reports gradual improvement since admission. Currently less drowsy than yesterday, and more communicative . States she is " feeling better". She has been more visible on unit - behavior on unit in good control. Currently denies suicidal or self injurious ideations. Denies medication side effects.  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 ADD (attention deficit disorder)   . Anxiety   . Bipolar 1 disorder ([place])   . Depression   . HA (headache)   . Hepatitis   . Hypertension   . PTSD (post-traumatic stress disorder)   . Status post tubal ligation 11/21/2015   Postpartum    Past Surgical History:  Procedure Laterality Date  . MOUTH SURGERY    . TUBAL LIGATION Bilateral 11/21/2015   Procedure: POST PARTUM TUBAL LIGATION;  Surgeon: Will Bonnet, MD;  Location: [facility] ORS;  Service: Gynecology;  Laterality: Bilateral;   Family History:  Family History  Problem Relation Age of Onset  . Alcohol abuse  Mother   . Depression Mother   . Heart attack Mother   . Alcohol abuse Father   . Diabetes Father    Family Psychiatric  History:  Social History:  Social History   Substance and Sexual Activity  Alcohol Use Yes  . Alcohol/week: 0.0 standard drinks   Comment: Patient endorses drinking occassionally. Amount unspecified      Social History   Substance and Sexual Activity  Drug Use Yes  . Types: Methamphetamines, Heroin, Amphetamines, Marijuana, Cocaine   Comment: heroin    Social History   Socioeconomic History  . Marital status: Single    Spouse name: Not on file  . Number of children: 2  . Years of education: 10 th  . Highest education level: Not on file  Occupational History  . Not on file  Social Needs  . Financial resource strain: Not on file  . Food insecurity:    Worry: Not on file    Inability: Not on file  . Transportation needs:    Medical: Not on file    Non-medical: Not on file  Tobacco Use  . Smoking status: Current Every Day Smoker    Packs/day: 0.50    Types: Cigarettes  . Smokeless tobacco: Never Used  Substance and Sexual Activity  . Alcohol use: Yes    Alcohol/week: 0.0 standard drinks    Comment: Patient endorses drinking occassionally. Amount unspecified   . Drug use: Yes    Types: Methamphetamines, Heroin, Amphetamines, Marijuana, Cocaine    Comment: heroin  . Sexual activity: Yes    Birth control/protection: Condom, Surgical  Lifestyle  .  Physical activity:    Days per week: Not on file    Minutes per session: Not on file  . Stress: Not on file  Relationships  . Social connections:    Talks on phone: Not on file    Gets together: Not on file    Attends religious service: Not on file    Active member of club or organization: Not on file    Attends meetings of clubs or organizations: Not on file    Relationship status: Not on file  Other Topics Concern  . Not on file  Social History Narrative   Patient lives at home with friends  and she is single.   Unemployed.   Education 10 th grade.   Right handed.   Caffeine Mountain dew and pepsi five or more cups daily.   Additional Social History:   Sleep: Improving  Appetite:  Improving  Current Medications: Current Facility-Administered Medications  Medication Dose Route Frequency Provider Last Rate Last Dose  . acetaminophen (TYLENOL) tablet 650 mg  650 mg Oral Q6H PRN Laverle Hobby, PA-C      . alum & mag hydroxide-simeth (MAALOX/MYLANTA) 200-200-20 MG/5ML suspension 30 mL  30 mL Oral Q4H PRN Patriciaann Clan E, PA-C      . cloNIDine (CATAPRES) tablet 0.1 mg  0.1 mg Oral BH-qamhs Simon, Spencer E, PA-C       Followed by  . [START ON 06/04/2018] cloNIDine (CATAPRES) tablet 0.1 mg  0.1 mg Oral QAC breakfast Laverle Hobby, PA-C      . dicyclomine (BENTYL) tablet 20 mg  20 mg Oral Q6H PRN Patriciaann Clan E, PA-C      . feeding supplement (ENSURE ENLIVE) (ENSURE ENLIVE) liquid 237 mL  237 mL Oral BID BM Cobos, Myer Peer, MD   237 mL at 06/02/18 0945  . FLUoxetine (PROZAC) capsule 20 mg  20 mg Oral Daily Johnn Hai, MD   20 mg at 06/02/18 0939  . hydrOXYzine (ATARAX/VISTARIL) tablet 25 mg  25 mg Oral Q6H PRN Lindon Romp A, NP   25 mg at 06/01/18 2020  . loperamide (IMODIUM) capsule 2-4 mg  2-4 mg Oral PRN Patriciaann Clan E, PA-C      . magnesium hydroxide (MILK OF MAGNESIA) suspension 30 mL  30 mL Oral Daily PRN Laverle Hobby, PA-C      . methocarbamol (ROBAXIN) tablet 500 mg  500 mg Oral Q8H PRN Patriciaann Clan E, PA-C   500 mg at 05/31/18 1454  . multivitamin with minerals tablet 1 tablet  1 tablet Oral Daily Cobos, Myer Peer, MD   1 tablet at 06/02/18 0939  . naproxen (NAPROSYN) tablet 500 mg  500 mg Oral BID PRN Laverle Hobby, PA-C   500 mg at 05/31/18 2050  . nicotine (NICODERM CQ - dosed in mg/24 hours) patch 14 mg  14 mg Transdermal Daily Cobos, Myer Peer, MD   14 mg at 06/02/18 0939  . ondansetron (ZOFRAN-ODT) disintegrating tablet 4 mg  4 mg Oral Q6H  PRN Laverle Hobby, PA-C        Lab Results:  Results for orders placed or performed during the hospital encounter of 05/31/18 (from the past 48 hour(s))  Glucose, capillary     Status: Abnormal   Collection Time: 06/01/18 10:16 AM  Result Value Ref Range   Glucose-Capillary 101 (H) 70 - 99 mg/dL  Basic metabolic panel     Status: Abnormal   Collection Time: 06/02/18  6:11 AM  Result Value Ref Range   Sodium 139 135 - 145 mmol/L   Potassium 3.8 3.5 - 5.1 mmol/L   Chloride 107 98 - 111 mmol/L   CO2 26 22 - 32 mmol/L   Glucose, Bld 88 70 - 99 mg/dL   BUN 19 6 - 20 mg/dL   Creatinine, Ser 0.73 0.44 - 1.00 mg/dL   Calcium 8.6 (L) 8.9 - 10.3 mg/dL   GFR calc non Af Amer >60 >60 mL/min   GFR calc Af Amer >60 >60 mL/min   Anion gap 6 5 - 15    Comment: Performed at [facility], [place] [address], [place], [place] [address]    Blood Alcohol level:  Lab Results  Component Value Date   ETH <10 05/30/2018   ETH <10 87/56/4332    Metabolic Disorder Labs: No results found for: HGBA1C, MPG No results found for: PROLACTIN No results found for: CHOL, TRIG, HDL, CHOLHDL, VLDL, LDLCALC  Physical Findings: AIMS: Facial and Oral Movements Muscles of Facial Expression: None, normal Lips and Perioral Area: None, normal Jaw: None, normal Tongue: None, normal,Extremity Movements Upper (arms, wrists, hands, fingers): None, normal Lower (legs, knees, ankles, toes): None, normal, Trunk Movements Neck, shoulders, hips: None, normal, Overall Severity Severity of abnormal movements (highest score from questions above): None, normal Incapacitation due to abnormal movements: None, normal Patient's awareness of abnormal movements (rate only patient's report): No Awareness, Dental Status Current problems with teeth and/or dentures?: No Does patient usually wear dentures?: No  CIWA:  CIWA-Ar Total: 5 COWS:  COWS Total Score: 0  Musculoskeletal: Strength & Muscle Tone: within  normal limits Gait & Station: normal Patient leans: N/A  Psychiatric Specialty Exam: Physical Exam  ROS no chest pain, no shortness of breath, no coughing , no fever  Blood pressure 120/76, pulse 76, temperature 98.1 F (36.7 C), temperature source Oral, resp. rate 20, height 5' 4"  (1.626 m), weight 62.6 kg, SpO2 100 %, currently breastfeeding.Body mass index is 23.69 kg/m.  General Appearance: Fairly Groomed  Eye Contact:  Good  Speech:  Normal Rate  Volume:  Normal  Mood:  reports mood is improving   Affect:  less constricted   Thought Process:  Linear and Descriptions of Associations: Intact  Orientation:  Other:  less sedated   Thought Content:  denies hallucinations and does not appear internally preoccupied, no delusions expressed   Suicidal Thoughts:  No currently denies suicidal or self injurious ideations  Homicidal Thoughts:  No  Memory:  recent and remote grossly intact   Judgement:  Fair/ improving   Insight:  Fair  Psychomotor Activity:  Decreased  Concentration:  Concentration: Fair and Attention Span: Fair  Recall:  [employer] of Knowledge:  Fair  Language:  Fair  Akathisia:  Negative  Handed:  Right  AIMS (if indicated):     Assets:  Desire for Improvement Resilience  ADL's:  Intact  Cognition:  WNL  Sleep:  Number of Hours: 6.25    Assessment -  28 year old female, currently homeless, has 4 children whom she reports are in foster care at this time.  Presented voluntarily reporting depression, suicidal thoughts, auditory hallucinations.  History of substance abuse, describes methamphetamine as substance of choice.  Admission UDS was positive for cocaine, cannabis, opiates, amphetamines.  Patient describes improving mood.  States she "feels better".  Denies suicidal ideations.  Denies medication side effects.  Currently she is not endorsing symptoms of withdrawal.  Vital signs have improved and blood pressure  is now 120/76.  Denies dizziness or lightheadedness  at this time.  Noted to be more alert/less sedated today compared to yesterday.   Treatment Plan Summary: Daily contact with patient to assess and evaluate symptoms and progress in treatment, Medication management, Plan inpatient treatment  and medications as below  Treatment plan reviewed as below today 4/19 Encourage group and milieu participation to work on coping skills and symptom reduction Encourage efforts to work on abstinence  and relapse prevention  Treatment team working on disposition planning options  Continue Prozac 20 mgrs QDAY for depression Continue Clonidine detox protocol for opiate WDL - hold for SBP <90 Encourage PO fluids  Jenne Campus, MD 06/02/2018, 9:46 AM   Patient ID: [name], female   DOB: Aug 02, 1990, 28 y.o.   MRN: [id number]

## 2018-06-02 NOTE — Progress Notes (Signed)
Adult Psychoeducational Group Note  Date:  06/02/2018 Time:  9:10 PM  Group Topic/Focus:  Wrap-Up Group:   The focus of this group is to help patients review their daily goal of treatment and discuss progress on daily workbooks.  Participation Level:  Active  Participation Quality:  Appropriate  Affect:  Appropriate  Cognitive:  Appropriate  Insight: Appropriate  Engagement in Group:  Engaged  Modes of Intervention:  Discussion  Additional Comments:  Pt stated her goal was to discuss her discharge plan and pt did meet her goal.  Pt rated the day at a 10/10.  Latavia Goga 06/02/2018, 9:10 PM

## 2018-06-03 MED ORDER — FLUOXETINE HCL 20 MG PO CAPS: 20.0000 mg | ORAL_CAPSULE | Freq: Every day | ORAL | 3 refills | Status: DC

## 2018-06-03 MED ORDER — GABAPENTIN 100 MG PO CAPS: 100.0000 mg | ORAL_CAPSULE | Freq: Three times a day (TID) | ORAL | 2 refills | Status: DC

## 2018-06-03 MED ORDER — ADULT MULTIVITAMIN W/MINERALS CH: 1.0000 | ORAL_TABLET | Freq: Every day | ORAL | 1 refills | Status: DC

## 2018-06-03 NOTE — Progress Notes (Signed)
  [facility] Adult Case Management Discharge Plan :  Will you be returning to the same living situation after discharge:  Yes,  home At discharge, do you have transportation home?: Yes,  staying with husband Do you have the ability to pay for your medications: No. Referred to [facility].   Release of information consent forms completed and in the chart. Letter on chart. Patient to Follow up at: Follow-up Information    [facility] Follow up.   Specialty:  Addiction Medicine Why:  Referral made 05/31/2018. If you wish to pursue residential treatment at [facility], please call [phone] Contact information: [address] [place] [address] [phone]        [facility] Follow up.   Contact information: [address] [place] [address] [phone]           Next level of care provider has access to [facility] Link:no  Safety Planning and Suicide Prevention discussed: Yes,  with patient. SPE pamphlet on chart for patient to share with supports.  Have you used any form of tobacco in the last 30 days? (Cigarettes, Smokeless Tobacco, Cigars, and/or Pipes): Yes  Has patient been referred to the Quitline?: Patient refused referral  Patient has been referred for addiction treatment: Yes  Darreld Mclean, LCSWA 06/03/2018, 11:02 AM

## 2018-06-03 NOTE — [facility] Suicide Risk Assessment (Signed)
[facility] Discharge Suicide Risk Assessment   Principal Problem: polysubstance abuse/opiate dependency Discharge Diagnoses: Active Problems:   MDD (major depressive disorder), severe (HCC) Currently patient denies suicidal thoughts plans or intent her greatest risk of overdose is unintentional  Total Time spent with patient: 45 minutes Mental Status Per Nursing Assessment::   On Admission:  Self-harm thoughts  Demographic Factors:  Caucasian and Low socioeconomic status  Loss Factors: Decrease in vocational status  Historical Factors: Impulsivity  Risk Reduction Factors:   Religious beliefs about death  Continued Clinical Symptoms:  Alcohol/Substance Abuse/Dependencies  Cognitive Features That Contribute To Risk:  Polarized thinking    Suicide Risk:  Minimal: No identifiable suicidal ideation.  Patients presenting with no risk factors but with morbid ruminations; may be classified as minimal risk based on the severity of the depressive symptoms  Follow-up Information    [facility] Follow up.   Why:  Please attend your [facility] screening on Tuesday, 06/04/2018 at 7:45am.  Contact information: [address] [place] [address] [phone]        [facility] Follow up.   Specialty:  Addiction Medicine Why:  Referral made 05/31/2018. If you wish to pursue residential treatment at [facility], please call [phone] Contact information: [address] [place] [address] [phone]           Plan Of Care/Follow-up recommendations:  Activity:  full  Loisann Roach, MD 06/03/2018, 8:06 AM

## 2018-06-03 NOTE — Progress Notes (Signed)
Spiritual care group on grief and loss facilitated by chaplain Correna Meacham   Group opened with brief discussion and psycho-social ed around grief and loss in relationships and in relation to self - identifying life patterns, circumstances, changes that cause losses. Established group norm of speaking from own life experience. Group goal of establishing open and affirming space for members to share loss and experience with grief, normalize grief experience and provide psycho social education and grief support.    Pt was invited to group.  Did not attend.   

## 2018-06-03 NOTE — Discharge Summary (Signed)
Physician Discharge Summary Note  Patient:  [name] is an 28 y.o., female MRN:  [id number] DOB:  May 02, 1990 Patient phone:  [phone] (home)  [address] address[place]   [address]  [place] [id number],  Total Time spent with patient: 45 minutes  Date of Admission:  05/31/2018 Date of Discharge:06/03/2018  Reason for Admission:   This is the latest of numerous healthcare encounters in our system for [name] she is 28 years of age she is homeless she presented voluntarily seeking help was given outpatient resources then re-presented shortly thereafter, reporting auditory hallucinations, suicidal thoughts, and drug screen was positive for cocaine, cannabis, opiates and amphetamines Acknowledges dependency on opiates and abuse of methamphetamines denied the other compounds that were in her system at any rate is difficult to get an interview out of her she is sluggish she slurs her words she is oriented to person place and general situation.  She reports voices inside her head telling her to hurt herself she denies voices outside her head she denies visual hallucinations she denies thoughts of harming others.  Principal Problem: Depressive symptoms thoughts of self-harm in the context of homelessness opiate dependency and substance abuse Discharge Diagnoses: Active Problems:   MDD (major depressive disorder), severe (HCC) Past Medical History:  Past Medical History:  Diagnosis Date  . ADD (attention deficit disorder)   . Anxiety   . Bipolar 1 disorder (HCC)   . Depression   . HA (headache)   . Hepatitis   . Hypertension   . PTSD (post-traumatic stress disorder)   . Status post tubal ligation 11/21/2015   Postpartum    Past Surgical History:  Procedure Laterality Date  . MOUTH SURGERY    . TUBAL LIGATION Bilateral 11/21/2015   Procedure: POST PARTUM TUBAL LIGATION;  Surgeon: Conard NovakStephen D Jackson, MD;  Location: [facility] ORS;  Service: Gynecology;  Laterality: Bilateral;    Family History:  Family History  Problem Relation Age of Onset  . Alcohol abuse Mother   . Depression Mother   . Heart attack Mother   . Alcohol abuse Father   . Diabetes Father     Social History:  Social History   Substance and Sexual Activity  Alcohol Use Yes  . Alcohol/week: 0.0 standard drinks   Comment: Patient endorses drinking occassionally. Amount unspecified      Social History   Substance and Sexual Activity  Drug Use Yes  . Types: Methamphetamines, Heroin, Amphetamines, Marijuana, Cocaine   Comment: heroin    Social History   Socioeconomic History  . Marital status: Single    Spouse name: Not on file  . Number of children: 2  . Years of education: 10 th  . Highest education level: Not on file  Occupational History  . Not on file  Social Needs  . Financial resource strain: Not on file  . Food insecurity:    Worry: Not on file    Inability: Not on file  . Transportation needs:    Medical: Not on file    Non-medical: Not on file  Tobacco Use  . Smoking status: Current Every Day Smoker    Packs/day: 0.50    Types: Cigarettes  . Smokeless tobacco: Never Used  Substance and Sexual Activity  . Alcohol use: Yes    Alcohol/week: 0.0 standard drinks    Comment: Patient endorses drinking occassionally. Amount unspecified   . Drug use: Yes    Types: Methamphetamines, Heroin, Amphetamines, Marijuana, Cocaine    Comment: heroin  .  Sexual activity: Yes    Birth control/protection: Condom, Surgical  Lifestyle  . Physical activity:    Days per week: Not on file    Minutes per session: Not on file  . Stress: Not on file  Relationships  . Social connections:    Talks on phone: Not on file    Gets together: Not on file    Attends religious service: Not on file    Active member of club or organization: Not on file    Attends meetings of clubs or organizations: Not on file    Relationship status: Not on file  Other Topics Concern  . Not on file   Social History Narrative   Patient lives at home with friends and she is single.   Unemployed.   Education 10 th grade.   Right handed.   Caffeine Mountain dew and pepsi five or more cups daily.    Hospital Course:    Patient was started on a detox regimen utilizing predominantly Catapres and Robaxin While here, she displayed no dangerous behaviors.  She had an uneventful weekend she generally stayed in bed- By the date of the 19th she was noted to be more visible on the unit was able to articulate her stressors discussing her children in foster care and being homeless, by the date of the 20th she requested discharge home stated she made arrangements to stay with her husband.  On this mental status exam she is alert oriented fully and cooperative without thoughts of harming self or others and contracting fully, no cravings tremors or withdrawal symptoms.  No involuntary movements.  felt the medications were helpful we continued fluoxetine we added gabapentin  Physical Findings: AIMS: Facial and Oral Movements Muscles of Facial Expression: None, normal Lips and Perioral Area: None, normal Jaw: None, normal Tongue: None, normal,Extremity Movements Upper (arms, wrists, hands, fingers): None, normal Lower (legs, knees, ankles, toes): None, normal, Trunk Movements Neck, shoulders, hips: None, normal, Overall Severity Severity of abnormal movements (highest score from questions above): None, normal Incapacitation due to abnormal movements: None, normal Patient's awareness of abnormal movements (rate only patient's report): No Awareness, Dental Status Current problems with teeth and/or dentures?: No Does patient usually wear dentures?: No  CIWA:  CIWA-Ar Total: 0 COWS:  COWS Total Score: 0  Musculoskeletal: Strength & Muscle Tone: within normal limits Gait & Station: normal Patient leans: N/A  Psychiatric Specialty Exam: Physical Exam  ROS  Blood pressure 122/81, pulse 75,  temperature 98 F (36.7 C), temperature source Oral, resp. rate 20, height  (1.626 m), weight 62.6 kg, SpO2 100 %, currently breastfeeding.Body mass index is 23.69 kg/m.  General Appearance: Casual  Eye Contact:  Good  Speech:  Clear and Coherent  Volume:  Normal  Mood:  nl  Affect:  Appropriate  Thought Process:  Coherent  Orientation:  Full (Time, Place, and Person)  Thought Content:  Logical  Suicidal Thoughts:  No  Homicidal Thoughts:  No  Memory:  Immediate;   Good  Judgement:  Good  Insight:  Good  Psychomotor Activity:  Normal  Concentration:  Concentration: Good  Recall:  Good  Fund of Knowledge:  Good  Language:  Good  Akathisia:  Negative  Handed:  Right  AIMS (if indicated):     Assets:  Physical Health Resilience  ADL's:  Intact  Cognition:  WNL  Sleep:  Number of Hours: 5.75     Have you used any form of tobacco in the last 30 days? (  Cigarettes, Smokeless Tobacco, Cigars, and/or Pipes): Yes  Has this patient used any form of tobacco in the last 30 days? (Cigarettes, Smokeless Tobacco, Cigars, and/or Pipes) Yes, No  Blood Alcohol level:  Lab Results  Component Value Date   ETH <10 05/30/2018   ETH <10 01/19/2017    Metabolic Disorder Labs:  No results found for: HGBA1C, MPG No results found for: PROLACTIN No results found for: CHOL, TRIG, HDL, CHOLHDL, VLDL, LDLCALC  See Psychiatric Specialty Exam and Suicide Risk Assessment completed by Attending Physician prior to discharge.  Discharge destination:  Home  Is patient on multiple antipsychotic therapies at discharge:  No   Has Patient had three or more failed trials of antipsychotic monotherapy by history:  No  Recommended Plan for Multiple Antipsychotic Therapies: NA   Allergies as of 06/03/2018   No Known Allergies     Medication List    TAKE these medications     Indication  FLUoxetine 20 MG capsule Commonly known as:  PROZAC Take 1 capsule (20 mg total) by mouth daily.   Indication:  Depression   gabapentin 100 MG capsule Commonly known as:  Neurontin Take 1 capsule (100 mg total) by mouth 3 (three) times daily.  Indication:  Abuse or Misuse of Alcohol   ibuprofen 600 MG tablet Commonly known as:  ADVIL Take 1 tablet (600 mg total) by mouth every 6 (six) hours as needed.  Indication:  Inflammation   multivitamin with minerals Tabs tablet Take 1 tablet by mouth daily.  Indication:  21-Hydroxylase Deficiency      Follow-up Information    [facility] Follow up.   Why:  Please attend your [facility] screening on Tuesday, 06/04/2018 at 7:45am.  Contact information: [address] [place] [address] [phone]        [facility] Follow up.   Specialty:  Addiction Medicine Why:  Referral made 05/31/2018. If you wish to pursue residential treatment at [facility], please call [phone] Contact information: [address] [place] [address] [phone]          Signed: Malvin Johns, MD 06/03/2018, 8:09 AM

## 2018-06-03 NOTE — Progress Notes (Signed)
Recreation Therapy Notes  Date:  4.20.20 Time: 0930 Location: 300 Hall Dayroom  Group Topic: Stress Management  Goal Area(s) Addresses:  Patient will identify positive stress management techniques. Patient will identify benefits of using stress management post d/c.  Intervention: Stress Management  Activity :  Meditation.  LRT introduced the stress management technique of meditation.  LRT played Bowman meditation that focused on making the most of the day and the possibilities that are available.  Patients were to listen and follow along as meditation was played to engage in activity.  Education: Stress Management, Discharge Planning.   Education Outcome: Acknowledges Education  Clinical Observations/Feedback: Pt did not attend group.     [name], LRT/CTRS         [name] 06/03/2018 11:12 AM 

## 2018-06-03 NOTE — Progress Notes (Signed)
CSW met with patient at nurse's station to review discharge plan. Patient aware of [facility] residential screening but now reports she is not interested in residential substance use treatment. She reports she will stay with her husband and is agreeable to follow up with [facility] for outpatient.  Stephanie Acre, LCSW-A Clinical Social Worker

## 2018-06-03 NOTE — Progress Notes (Signed)
Pt discharged to lobby. Pt was stable and appreciative at that time. All papers and prescriptions were given and valuables returned. Verbal understanding expressed. Denies SI/HI and A/VH. Pt given opportunity to express concerns and ask questions.  

## 2018-06-03 NOTE — [facility] Suicide Risk Assessment (Signed)
[facility] INPATIENT:  Family/Significant Other Suicide Prevention Education  Suicide Prevention Education:  Patient Refusal for Family/Significant Other Suicide Prevention Education: The patient [name] has refused to provide written consent for family/significant other to be provided Family/Significant Other Suicide Prevention Education during admission and/or prior to discharge.  Physician notified.  Patient denies SI. SPE pamphlet on chart.  Darreld Mclean 06/03/2018, 9:32 AM

## 2018-07-15 ENCOUNTER — Encounter (HOSPITAL_COMMUNITY): Payer: Self-pay | Admitting: Emergency Medicine

## 2018-07-15 ENCOUNTER — Inpatient Hospital Stay (HOSPITAL_COMMUNITY)
Admission: EM | Admit: 2018-07-15 | Discharge: 2018-07-18 | DRG: 872 | Disposition: A | Discharge status: Home / Self Care | Payer: Self-pay | Attending: Internal Medicine | Admitting: Internal Medicine

## 2018-07-15 ENCOUNTER — Emergency Department (HOSPITAL_COMMUNITY): Payer: Self-pay

## 2018-07-15 ENCOUNTER — Other Ambulatory Visit: Payer: Self-pay

## 2018-07-15 DIAGNOSIS — Z818 Family history of other mental and behavioral disorders: Secondary | ICD-10-CM

## 2018-07-15 DIAGNOSIS — F988 Other specified behavioral and emotional disorders with onset usually occurring in childhood and adolescence: Secondary | ICD-10-CM | POA: Diagnosis present

## 2018-07-15 DIAGNOSIS — L02411 Cutaneous abscess of right axilla: Secondary | ICD-10-CM

## 2018-07-15 DIAGNOSIS — E8770 Fluid overload, unspecified: Secondary | ICD-10-CM | POA: Diagnosis present

## 2018-07-15 DIAGNOSIS — F319 Bipolar disorder, unspecified: Secondary | ICD-10-CM | POA: Diagnosis present

## 2018-07-15 DIAGNOSIS — F17210 Nicotine dependence, cigarettes, uncomplicated: Secondary | ICD-10-CM | POA: Diagnosis present

## 2018-07-15 DIAGNOSIS — F1123 Opioid dependence with withdrawal: Secondary | ICD-10-CM | POA: Diagnosis not present

## 2018-07-15 DIAGNOSIS — B1920 Unspecified viral hepatitis C without hepatic coma: Secondary | ICD-10-CM | POA: Diagnosis present

## 2018-07-15 DIAGNOSIS — I959 Hypotension, unspecified: Secondary | ICD-10-CM | POA: Diagnosis present

## 2018-07-15 DIAGNOSIS — F1990 Other psychoactive substance use, unspecified, uncomplicated: Secondary | ICD-10-CM

## 2018-07-15 DIAGNOSIS — A419 Sepsis, unspecified organism: Principal | ICD-10-CM | POA: Diagnosis present

## 2018-07-15 DIAGNOSIS — R768 Other specified abnormal immunological findings in serum: Secondary | ICD-10-CM

## 2018-07-15 DIAGNOSIS — Z811 Family history of alcohol abuse and dependence: Secondary | ICD-10-CM

## 2018-07-15 DIAGNOSIS — Z8249 Family history of ischemic heart disease and other diseases of the circulatory system: Secondary | ICD-10-CM

## 2018-07-15 DIAGNOSIS — F1290 Cannabis use, unspecified, uncomplicated: Secondary | ICD-10-CM | POA: Diagnosis present

## 2018-07-15 DIAGNOSIS — F1590 Other stimulant use, unspecified, uncomplicated: Secondary | ICD-10-CM | POA: Diagnosis present

## 2018-07-15 DIAGNOSIS — I10 Essential (primary) hypertension: Secondary | ICD-10-CM | POA: Diagnosis present

## 2018-07-15 DIAGNOSIS — F322 Major depressive disorder, single episode, severe without psychotic features: Secondary | ICD-10-CM | POA: Diagnosis present

## 2018-07-15 DIAGNOSIS — Z20828 Contact with and (suspected) exposure to other viral communicable diseases: Secondary | ICD-10-CM | POA: Diagnosis present

## 2018-07-15 DIAGNOSIS — Z833 Family history of diabetes mellitus: Secondary | ICD-10-CM

## 2018-07-15 DIAGNOSIS — F4310 Post-traumatic stress disorder, unspecified: Secondary | ICD-10-CM | POA: Diagnosis present

## 2018-07-15 DIAGNOSIS — R0789 Other chest pain: Secondary | ICD-10-CM | POA: Diagnosis present

## 2018-07-15 DIAGNOSIS — R51 Headache: Secondary | ICD-10-CM | POA: Diagnosis present

## 2018-07-15 DIAGNOSIS — T43226A Underdosing of selective serotonin reuptake inhibitors, initial encounter: Secondary | ICD-10-CM | POA: Diagnosis present

## 2018-07-15 DIAGNOSIS — R7689 Other specified abnormal immunological findings in serum: Secondary | ICD-10-CM

## 2018-07-15 DIAGNOSIS — F419 Anxiety disorder, unspecified: Secondary | ICD-10-CM | POA: Diagnosis present

## 2018-07-15 LAB — COMPREHENSIVE METABOLIC PANEL
ALT: 239 U/L — ABNORMAL HIGH (ref 0–44)
AST: 112 U/L — ABNORMAL HIGH (ref 15–41)
Albumin: 3.5 g/dL (ref 3.5–5.0)
Alkaline Phosphatase: 76 U/L (ref 38–126)
Anion gap: 11 (ref 5–15)
BUN: 10 mg/dL (ref 6–20)
CO2: 24 mmol/L (ref 22–32)
Calcium: 9.3 mg/dL (ref 8.9–10.3)
Chloride: 99 mmol/L (ref 98–111)
Creatinine, Ser: 0.99 mg/dL (ref 0.44–1.00)
GFR calc Af Amer: >60 mL/min (ref >60)
GFR calc non Af Amer: >60 mL/min (ref >60)
Glucose, Bld: 108 mg/dL — ABNORMAL HIGH (ref 70–99)
Potassium: 3.2 mmol/L — ABNORMAL LOW (ref 3.5–5.1)
Sodium: 134 mmol/L — ABNORMAL LOW (ref 135–145)
Total Bilirubin: 1.5 mg/dL — ABNORMAL HIGH (ref 0.3–1.2)
Total Protein: 6.7 g/dL (ref 6.5–8.1)

## 2018-07-15 LAB — CBC WITH DIFFERENTIAL/PLATELET
Abs Immature Granulocytes: 0.05 10*3/uL (ref 0.00–0.07)
Basophils Absolute: 0.1 10*3/uL (ref 0.0–0.1)
Basophils Relative: 1 %
Eosinophils Absolute: 0 10*3/uL (ref 0.0–0.5)
Eosinophils Relative: 0 %
HCT: 42.7 % (ref 36.0–46.0)
Hemoglobin: 14.2 g/dL (ref 12.0–15.0)
Immature Granulocytes: 1 %
Lymphocytes Relative: 4 %
Lymphs Abs: 0.4 10*3/uL — ABNORMAL LOW (ref 0.7–4.0)
MCH: 32.1 pg (ref 26.0–34.0)
MCHC: 33.3 g/dL (ref 30.0–36.0)
MCV: 96.4 fL (ref 80.0–100.0)
Monocytes Absolute: 0.2 10*3/uL (ref 0.1–1.0)
Monocytes Relative: 2 %
Neutro Abs: 10.4 10*3/uL — ABNORMAL HIGH (ref 1.7–7.7)
Neutrophils Relative %: 92 %
Platelets: 194 10*3/uL (ref 150–400)
RBC: 4.43 MIL/uL (ref 3.87–5.11)
RDW: 12.2 % (ref 11.5–15.5)
WBC: 11.1 10*3/uL — ABNORMAL HIGH (ref 4.0–10.5)
nRBC: 0 % (ref 0.0–0.2)

## 2018-07-15 LAB — I-STAT BETA HCG BLOOD, ED (MC, WL, AP ONLY): I-stat hCG, quantitative: <5 m[IU]/mL (ref <5)

## 2018-07-15 LAB — PROTIME-INR
INR: 1.2 (ref 0.8–1.2)
Prothrombin Time: 15.5 seconds — ABNORMAL HIGH (ref 11.4–15.2)

## 2018-07-15 LAB — LACTIC ACID, PLASMA
Lactic Acid, Venous: 2 mmol/L (ref 0.5–1.9)
Lactic Acid, Venous: 3.2 mmol/L (ref 0.5–1.9)

## 2018-07-15 MED ORDER — ONDANSETRON HCL 4 MG PO TABS: 4.0000 mg | ORAL_TABLET | Freq: Four times a day (QID) | ORAL | Status: DC | PRN

## 2018-07-15 MED ORDER — SODIUM CHLORIDE 0.9 % IV SOLN
2.0000 g | INTRAVENOUS | Status: DC
  Administered 2018-07-16 – 2018-07-17 (×2): 2 g via INTRAVENOUS
  Filled 2018-07-15 (×3): qty 20

## 2018-07-15 MED ORDER — SODIUM CHLORIDE 0.9 % IV BOLUS
1000.0000 mL | Freq: Once | INTRAVENOUS | Status: AC
  Administered 2018-07-15: 1000 mL via INTRAVENOUS

## 2018-07-15 MED ORDER — POTASSIUM CHLORIDE 20 MEQ/15ML (10%) PO SOLN
40.0000 meq | Freq: Once | ORAL | Status: AC
  Administered 2018-07-16: 40 meq via ORAL
  Filled 2018-07-15: qty 30

## 2018-07-15 MED ORDER — BUPRENORPHINE HCL-NALOXONE HCL 2-0.5 MG SL SUBL
1.0000 | SUBLINGUAL_TABLET | Freq: Every day | SUBLINGUAL | Status: DC
  Administered 2018-07-16 – 2018-07-17 (×3): 1 via SUBLINGUAL
  Filled 2018-07-15 (×3): qty 1

## 2018-07-15 MED ORDER — SODIUM CHLORIDE 0.9 % IV SOLN
INTRAVENOUS | Status: AC
  Administered 2018-07-16: 04:00:00 via INTRAVENOUS

## 2018-07-15 MED ORDER — NICOTINE 21 MG/24HR TD PT24
21.0000 mg | MEDICATED_PATCH | Freq: Every day | TRANSDERMAL | Status: DC
  Administered 2018-07-16 – 2018-07-18 (×3): 21 mg via TRANSDERMAL
  Filled 2018-07-15 (×3): qty 1

## 2018-07-15 MED ORDER — ENOXAPARIN SODIUM 40 MG/0.4ML ~~LOC~~ SOLN
40.0000 mg | SUBCUTANEOUS | Status: DC
  Administered 2018-07-16 – 2018-07-17 (×2): 40 mg via SUBCUTANEOUS
  Filled 2018-07-15 (×4): qty 0.4

## 2018-07-15 MED ORDER — VANCOMYCIN HCL IN DEXTROSE 750-5 MG/150ML-% IV SOLN
750.0000 mg | Freq: Two times a day (BID) | INTRAVENOUS | Status: DC
  Administered 2018-07-16: 750 mg via INTRAVENOUS
  Filled 2018-07-15: qty 150

## 2018-07-15 MED ORDER — LIDOCAINE-EPINEPHRINE (PF) 2 %-1:200000 IJ SOLN
10.0000 mL | Freq: Once | INTRAMUSCULAR | Status: AC
  Administered 2018-07-15: 10 mL via INTRADERMAL
  Filled 2018-07-15: qty 20

## 2018-07-15 MED ORDER — VANCOMYCIN HCL IN DEXTROSE 1-5 GM/200ML-% IV SOLN: 1000.0000 mg | Freq: Once | INTRAVENOUS | Status: DC

## 2018-07-15 MED ORDER — ACETAMINOPHEN 325 MG PO TABS
650.0000 mg | ORAL_TABLET | Freq: Four times a day (QID) | ORAL | Status: DC | PRN
  Administered 2018-07-16 – 2018-07-17 (×4): 650 mg via ORAL
  Filled 2018-07-15 (×5): qty 2

## 2018-07-15 MED ORDER — ACETAMINOPHEN 650 MG RE SUPP: 650.0000 mg | Freq: Four times a day (QID) | RECTAL | Status: DC | PRN

## 2018-07-15 MED ORDER — ONDANSETRON HCL 4 MG/2ML IJ SOLN
4.0000 mg | Freq: Four times a day (QID) | INTRAMUSCULAR | Status: DC | PRN
  Administered 2018-07-17 – 2018-07-18 (×2): 4 mg via INTRAVENOUS
  Filled 2018-07-15 (×2): qty 2

## 2018-07-15 MED ORDER — VANCOMYCIN HCL 10 G IV SOLR
1500.0000 mg | Freq: Once | INTRAVENOUS | Status: AC
  Administered 2018-07-15: 1500 mg via INTRAVENOUS
  Filled 2018-07-15: qty 1500

## 2018-07-15 MED ORDER — CLINDAMYCIN PHOSPHATE 600 MG/50ML IV SOLN
600.0000 mg | Freq: Once | INTRAVENOUS | Status: AC
  Administered 2018-07-15: 600 mg via INTRAVENOUS
  Filled 2018-07-15: qty 50

## 2018-07-15 MED ORDER — SODIUM CHLORIDE 0.9 % IV SOLN
2.0000 g | Freq: Once | INTRAVENOUS | Status: AC
  Administered 2018-07-15: 2 g via INTRAVENOUS
  Filled 2018-07-15: qty 20

## 2018-07-15 MED ORDER — SODIUM CHLORIDE 0.9 % IV SOLN: 2.0000 g | Freq: Once | INTRAVENOUS | Status: DC

## 2018-07-15 MED ORDER — SODIUM CHLORIDE 0.9% FLUSH: 3.0000 mL | Freq: Once | INTRAVENOUS | Status: DC

## 2018-07-15 NOTE — ED Triage Notes (Addendum)
Pt c/o feeling weak, reports abscess under her right axilla x 4 days. 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 Hypotensive and tachycardic in triage.

## 2018-07-15 NOTE — ED Notes (Signed)
[place] (brother) call for updates [phone]

## 2018-07-15 NOTE — H&P (Signed)
History and Physical    [name] [facility]:[id number] DOB: 06/07/1990 DOA: 07/15/2018  PCP: Patient, No Pcp Per  Patient coming from: Home  I have personally briefly reviewed patient's old medical records in [place]  Chief Complaint: Right axilla abscess  HPI: [name] is a 28 y.o. female with medical history significant for bipolar disorder/depression, hepatitis C, and substance use disorder who presents to the ED for evaluation of abscess in her right axilla.  Patient first noticed the abscess about 5 days ago.  She says she has had similar lesions in the past which resolved on their own therefore she did not think much of it at this time.  She had progressive swelling and pain at the area and developed symptoms of subjective fevers, chills, diaphoresis, nausea, vomiting, and malaise.  She has had intermittent lightheadedness without syncope.  She admits to IV heroin use, previously clean for 2 years however relapsed about 1 month ago.  She reports last use approximately 10 hours prior to admission.  She does report a history of withdrawal.  She also admits to methamphetamine and marijuana use.  She smokes about 0.5 PPD cigarettes.  She reports occasional alcohol use.  ED Course:  Initial vitals showed BP 93/68, pulse 112, RR 12, temp 98.5 Fahrenheit, SPO2 98% on room air.  WBC 11.1, hemoglobin 14.2, platelets 194,000, potassium 3.2, BUN 10, creatinine 0.99, AST 112, ALT 239, alk phos 76, total bilirubin 1.5, lactic acid 2.0, negative i-STAT beta-hCG.  Blood cultures were drawn.  SARS-CoV-2 test was obtained and pending.  Portable chest x-ray was without focal consolidation, edema, or effusion.  Patient was given 1 L normal saline in the ED.  She underwent I&D of the right axillary abscess by the ED provider.  She was given IV clindamycin.  Repeat lactic acid was 3. 2.  She had persistent hypotension and tachycardia was given additional maintenance fluids and started on IV  vancomycin and ceftriaxone.  The hospitalist service was consulted to admit for further evaluation and management of sepsis due to right axillary abscess.   Review of Systems: All systems reviewed and are negative except as documented in history of present illness above.   Past Medical History:  Diagnosis Date  . ADD (attention deficit disorder)   . Anxiety   . Bipolar 1 disorder ([place])   . Depression   . HA (headache)   . Hepatitis   . Hypertension   . PTSD (post-traumatic stress disorder)   . Status post tubal ligation 11/21/2015   Postpartum    Past Surgical History:  Procedure Laterality Date  . MOUTH SURGERY    . TUBAL LIGATION Bilateral 11/21/2015   Procedure: POST PARTUM TUBAL LIGATION;  Surgeon: Will Bonnet, MD;  Location: [facility] ORS;  Service: Gynecology;  Laterality: Bilateral;    Social History:  reports that she has been smoking cigarettes. She has been smoking about 0.50 packs per day. She has never used smokeless tobacco. She reports current alcohol use. She reports current drug use. Drugs: Methamphetamines, Heroin, Amphetamines, Marijuana, Cocaine, and IV.  No Known Allergies  Family History  Problem Relation Age of Onset  . Alcohol abuse Mother   . Depression Mother   . Heart attack Mother   . Alcohol abuse Father   . Diabetes Father      Prior to Admission medications   Medication Sig Start Date End Date Taking? Authorizing Provider  FLUoxetine (PROZAC) 20 MG capsule Take 1 capsule (20 mg total)  by mouth daily. Patient not taking: Reported on 07/15/2018 06/03/18   Johnn Hai, MD  gabapentin (NEURONTIN) 100 MG capsule Take 1 capsule (100 mg total) by mouth 3 (three) times daily. Patient not taking: Reported on 07/15/2018 06/03/18 06/03/19  Johnn Hai, MD  ibuprofen (ADVIL,MOTRIN) 600 MG tablet Take 1 tablet (600 mg total) by mouth every 6 (six) hours as needed. Patient not taking: Reported on 05/30/2018 06/11/17   Charlann Lange, PA-C  Multiple Vitamin  (MULTIVITAMIN WITH MINERALS) TABS tablet Take 1 tablet by mouth daily. Patient not taking: Reported on 07/15/2018 06/03/18   Johnn Hai, MD    Physical Exam: Vitals:   07/15/18 1948 07/15/18 1950 07/15/18 2000 07/15/18 2010  BP: (!) 82/63 91/62 93/68  100/67  Pulse: (!) 115 (!) 117 (!) 114 (!) 114  Resp: 17 11 12 15   Temp:      TempSrc:      SpO2: 92% 97% 98% 100%    Constitutional: Resting supine in bed, NAD, calm, comfortable Eyes: PERRL, lids and conjunctivae normal ENMT: Mucous membranes are moist. Posterior pharynx clear of any exudate or lesions.Normal dentition.  Neck: normal, supple, no masses. Respiratory: clear to auscultation bilaterally, no wheezing, no crackles. Normal respiratory effort. No accessory muscle use.  Cardiovascular: Regular rate and rhythm, no murmurs / rubs / gallops. No extremity edema. 2+ pedal pulses. Abdomen: no tenderness, no masses palpated. No hepatosplenomegaly. Bowel sounds positive.  Musculoskeletal: no clubbing / cyanosis. No joint deformity upper and lower extremities. Good ROM, no contractures. Normal muscle tone.  Skin: Right axilla abscess status post I&D, multiple small erythematous spots on hands at needle insertion sites Neurologic: CN 2-12 grossly intact. Sensation intact, Strength 5/5 in all 4.  Psychiatric: Normal judgment and insight. Alert and oriented x 3. Normal mood.     Labs on Admission: I have personally reviewed following labs and imaging studies  CBC: Recent Labs  Lab 07/15/18 1940  WBC 11.1*  NEUTROABS 10.4*  HGB 14.2  HCT 42.7  MCV 96.4  PLT 297   Basic Metabolic Panel: Recent Labs  Lab 07/15/18 1940  NA 134*  K 3.2*  CL 99  CO2 24  GLUCOSE 108*  BUN 10  CREATININE 0.99  CALCIUM 9.3   GFR: CrCl cannot be calculated (Unknown ideal weight.). Liver Function Tests: Recent Labs  Lab 07/15/18 1940  AST 112*  ALT 239*  ALKPHOS 76  BILITOT 1.5*  PROT 6.7  ALBUMIN 3.5   No results for input(s):  LIPASE, AMYLASE in the last 168 hours. No results for input(s): AMMONIA in the last 168 hours. Coagulation Profile: Recent Labs  Lab 07/15/18 1940  INR 1.2   Cardiac Enzymes: No results for input(s): CKTOTAL, CKMB, CKMBINDEX, TROPONINI in the last 168 hours. BNP (last 3 results) No results for input(s): PROBNP in the last 8760 hours. HbA1C: No results for input(s): HGBA1C in the last 72 hours. CBG: No results for input(s): GLUCAP in the last 168 hours. Lipid Profile: No results for input(s): CHOL, HDL, LDLCALC, TRIG, CHOLHDL, LDLDIRECT in the last 72 hours. Thyroid Function Tests: No results for input(s): TSH, T4TOTAL, FREET4, T3FREE, THYROIDAB in the last 72 hours. Anemia Panel: No results for input(s): VITAMINB12, FOLATE, FERRITIN, TIBC, IRON, RETICCTPCT in the last 72 hours. Urine analysis:    Component Value Date/Time   COLORURINE YELLOW 09/13/2016 1454   APPEARANCEUR CLOUDY (A) 09/13/2016 1454   APPEARANCEUR Clear 05/29/2014 0306   LABSPEC 1.017 09/13/2016 1454   LABSPEC 1.008 05/29/2014 0306  PHURINE 8.0 09/13/2016 1454   GLUCOSEU NEGATIVE 09/13/2016 1454   GLUCOSEU Negative 05/29/2014 0306   HGBUR NEGATIVE 09/13/2016 1454   BILIRUBINUR NEGATIVE 09/13/2016 1454   BILIRUBINUR Negative 05/29/2014 0306   KETONESUR NEGATIVE 09/13/2016 1454   PROTEINUR NEGATIVE 09/13/2016 1454   UROBILINOGEN 0.2 02/20/2012 2243   NITRITE NEGATIVE 09/13/2016 1454   LEUKOCYTESUR TRACE (A) 09/13/2016 1454   LEUKOCYTESUR Negative 05/29/2014 0306    Radiological Exams on Admission: Dg Chest Port 1 View  Result Date: 07/15/2018 CLINICAL DATA:  Sepsis EXAM: PORTABLE CHEST 1 VIEW COMPARISON:  Chest x-ray dated 03/01/2015 FINDINGS: There is a rounded opacity at the left lung base. There is mild volume overload. No pneumothorax. No large area of consolidation. The cardiac silhouette is normal with respect to size. There is no acute osseous abnormality. IMPRESSION: 1. Mild volume overload.  No  large area of consolidation. 2. Oval density projecting over the left lower lobe may represent a developing infiltrate. A 4-6 week follow-up two-view chest x-ray is recommended to confirm resolution of this finding. Electronically Signed   By: Constance Holster M.D.   On: 07/15/2018 23:36    EKG: Independently reviewed. Sinus tachycardia, rate 114, no acute ischemic changes.  Rate is faster when compared to prior.  Assessment/Plan Principal Problem:   Sepsis ([place]) Active Problems:   MDD (major depressive disorder), severe ([place])   Substance use disorder   Abscess of right axilla   Hepatitis C antibody test positive  [name] is a 28 y.o. female with medical history significant for bipolar disorder/depression, hepatitis C, and substance use disorder who is admitted with sepsis due to right axillary abscess.  Sepsis due to right axillary abscess: Likely secondary to injection drug use, high suspicion for bacteremia. -s/p I&D by ED physician 07/15/2018 -Continue IV vancomycin and ceftriaxone -Give additional 1 L fluid bolus followed by maintenance IV fluids -Follow-up blood cultures  Substance use disorder: Patient reports IV heroin use, last use approximately 10 hours prior to admission.  Also reports methamphetamine use.  She has a reported history of opiate withdrawal. -Monitor clinical opiate withdrawal scale -Start sublingual Suboxone to prevent withdrawal, adjust as needed  Mood disorder: History of bipolar disorder and major depression.  Patient believes she is currently taking Lamictal and no longer on fluoxetine as listed in current medication list. -Will need to verify accurate home medications  Elevated LFTs History of positive hepatitis C antibody: Positive hepatitis C antibody 12/07/2014, RNA was not detected at that time.  Given ongoing IV drug use, will repeat RNA in a.m.  Patient counseled that if elevated, it is recommended she follow-up with PCP/infectious  disease for further management and treatment.  She understands and is agreeable.  Tobacco use: Currently smoking about 0.5 PPD.  Nicotine patch ordered.   DVT prophylaxis: Subcutaneous Lovenox Code Status: Full code, confirmed with patient Family Communication: Attempted to call patient's mother-in-law Elmarie Mainland @ [phone] per patient request without answer Disposition Plan: Pending clinical progress Consults called: None Admission status: Inpatient, patient likely requires greater than 2 midnight stay for management of sepsis due to right axilla abscess requiring IV antibiotics and return of cultural data to determine further management.   Zada Finders MD [facility]  If 7PM-7AM, please contact night-coverage [email or web address]  07/15/2018, 11:43 PM

## 2018-07-15 NOTE — ED Provider Notes (Addendum)
[facility] EMERGENCY DEPARTMENT Provider Note   CSN: [id number] Arrival date & time: 07/15/18  1919    History   Chief Complaint Chief Complaint  Patient presents with   Abscess    HPI [name] is a 28 y.o. female.     HPI  Patient presents with concern of weakness, as well as abscess. Patient notes a history of anxiety, depression, which she takes her medication.  When she also has a history of polysubstance abuse, using methamphetamine and heroin. She last used these 2 medications within the past 6 hours. Over the past 2 days she has had worsening generalized weakness, concurrently has had increasing pain in the right axilla, near a raised skin lesion. She has a history of recurrent lesions in this area. She is unaware of a fever, denies vomiting, does have anorexia, with nausea, as above.   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   Status post tubal ligation 11/21/2015   Postpartum care following vaginal delivery 11/20/2015   Post term pregnancy at [redacted] weeks gestation 11/19/2015   PTSD (post-traumatic stress disorder)    Depression    Bipolar 1 disorder (HCC)    Major depression 02/16/2013   Alcohol abuse 02/16/2013   Anxiety state, unspecified 02/16/2013    Past Surgical History:  Procedure Laterality Date   MOUTH SURGERY     TUBAL LIGATION Bilateral 11/21/2015   Procedure: POST  PARTUM TUBAL LIGATION;  Surgeon: Conard Novak, MD;  Location: [facility] ORS;  Service: Gynecology;  Laterality: Bilateral;     OB History    Gravida  5   Para  4   Term  4   Preterm      AB  1   Living  4     SAB  1   TAB      Ectopic      Multiple  0   Live Births  4            Home Medications    Prior to Admission medications   Medication Sig Start Date End Date Taking? Authorizing Provider  FLUoxetine (PROZAC) 20 MG capsule Take 1 capsule (20 mg total) by mouth daily. Patient not taking: Reported on 07/15/2018 06/03/18   Malvin Johns, MD  gabapentin (NEURONTIN) 100 MG capsule Take 1 capsule (100 mg total) by mouth 3 (three) times daily. Patient not taking: Reported on 07/15/2018 06/03/18 06/03/19  Malvin Johns, MD  ibuprofen (ADVIL,MOTRIN) 600 MG tablet Take 1 tablet (600 mg total) by mouth every 6 (six) hours as needed. Patient not taking: Reported on 05/30/2018 06/11/17   Elpidio Anis, PA-C  Multiple Vitamin (MULTIVITAMIN WITH MINERALS) TABS tablet Take 1 tablet by mouth daily. Patient not taking: Reported on 07/15/2018 06/03/18   Malvin Johns, MD    Family History Family History  Problem Relation Age of Onset   Alcohol abuse Mother  Depression Mother    Heart attack Mother    Alcohol abuse Father    Diabetes Father     Social History Social History   Tobacco Use   Smoking status: Current Every Day Smoker    Packs/day: 0.50    Types: Cigarettes   Smokeless tobacco: Never Used  Substance Use Topics   Alcohol use: Yes    Alcohol/week: 0.0 standard drinks    Comment: Patient endorses drinking occassionally. Amount unspecified    Drug use: Yes    Types: Methamphetamines, Heroin, Amphetamines, Marijuana, Cocaine, IV    Comment: heroin     Allergies   Patient has no known allergies.   Review of Systems Review of Systems  Constitutional:       Per HPI, otherwise negative  HENT:       Per HPI, otherwise negative  Respiratory:        Per HPI, otherwise negative  Cardiovascular:       Per HPI, otherwise negative  Gastrointestinal: Negative for vomiting.  Endocrine:       Negative aside from HPI  Genitourinary:       Neg aside from HPI   Musculoskeletal:       Per HPI, otherwise negative  Skin: Positive for wound.  Neurological: Positive for weakness. Negative for syncope.  Psychiatric/Behavioral: The patient is nervous/anxious.      Physical Exam Updated Vital Signs BP 100/67    Pulse (!) 114    Temp 98.5 F (36.9 C) (Oral)    Resp 15    SpO2 100%   Physical Exam Vitals signs and nursing note reviewed.  Constitutional:      General: She is not in acute distress.    Appearance: She is well-developed. She is ill-appearing.  HENT:     Head: Normocephalic and atraumatic.  Eyes:     Conjunctiva/sclera: Conjunctivae normal.  Cardiovascular:     Rate and Rhythm: Regular rhythm. Tachycardia present.  Pulmonary:     Effort: Pulmonary effort is normal. No respiratory distress.     Breath sounds: Normal breath sounds. No stridor.  Abdominal:     General: There is no distension.  Skin:    General: Skin is warm and dry.       Neurological:     Mental Status: She is alert and oriented to person, place, and time.     Cranial Nerves: No cranial nerve deficit.  Psychiatric:     Comments: Flat affect      ED Treatments / Results  Labs (all labs ordered are listed, but only abnormal results are displayed) Labs Reviewed  COMPREHENSIVE METABOLIC PANEL - Abnormal; Notable for the following components:      Result Value   Sodium 134 (*)    Potassium 3.2 (*)    Glucose, Bld 108 (*)    AST 112 (*)    ALT 239 (*)    Total Bilirubin 1.5 (*)    All other components within normal limits  LACTIC ACID, PLASMA - Abnormal; Notable for the following components:   Lactic Acid, Venous 2.0 (*)    All other components within normal limits  LACTIC ACID, PLASMA - Abnormal; Notable for the following components:   Lactic  Acid, Venous 3.2 (*)    All other components within normal limits  CBC WITH DIFFERENTIAL/PLATELET - Abnormal; Notable for the following components:   WBC 11.1 (*)    Neutro Abs 10.4 (*)    Lymphs Abs 0.4 (*)  All other components within normal limits  PROTIME-INR - Abnormal; Notable for the following components:   Prothrombin Time 15.5 (*)    All other components within normal limits  CULTURE, BLOOD (ROUTINE X 2)  CULTURE, BLOOD (ROUTINE X 2)  SARS CORONAVIRUS 2 (HOSPITAL ORDER, PERFORMED IN  HOSPITAL LAB)  URINALYSIS, ROUTINE W REFLEX MICROSCOPIC  RAPID URINE DRUG SCREEN, HOSP PERFORMED  I-STAT BETA HCG BLOOD, ED ([facility], [facility], [facility] ONLY)    EKG EKG Interpretation  Date/Time:  Monday July 15 2018 19:43:09 EDT Ventricular Rate:  114 PR Interval:    QRS Duration: 84 QT Interval:  314 QTC Calculation: 433 R Axis:   67 Text Interpretation:  Sinus tachycardia Right atrial enlargement Abnormal ekg Confirmed by Gerhard Munch [phone]) on 07/15/2018 7:53:54 PM   Radiology No results found.  Procedures .Marland KitchenIncision and Drainage Date/Time: 07/15/2018 8:30 PM Performed by: Gerhard Munch, MD Authorized by: Gerhard Munch, MD   Consent:    Consent obtained:  Verbal   Consent given by:  Patient   Risks discussed:  Bleeding, infection, incomplete drainage and pain   Alternatives discussed:  No treatment, delayed treatment and alternative treatment Universal protocol:    Procedure explained and questions answered to patient or proxy's satisfaction: yes     Required blood products, implants, devices, and special equipment available: yes     Site/side marked: yes     Immediately prior to procedure a time out was called: yes     Patient identity confirmed:  Verbally with patient Location:    Type:  Abscess   Size:  4cm   Location:  Trunk   Trunk location:  Chest (R axilla) Pre-procedure details:    Skin preparation:  Antiseptic wash Anesthesia (see MAR for exact dosages):      Anesthesia method:  Local infiltration   Local anesthetic:  Lidocaine 1% WITH epi Procedure type:    Complexity:  Complex Procedure details:    Needle aspiration: no     Incision types:  Single straight   Incision depth:  Subcutaneous   Scalpel blade:  11   Wound management:  Probed and deloculated, irrigated with saline and extensive cleaning   Drainage:  Bloody and purulent   Drainage amount:  Copious   Wound treatment:  Drain placed   Packing materials:  1/4 in gauze   Amount 1/4":  15cm Post-procedure details:    Patient tolerance of procedure:  Tolerated well, no immediate complications   (including critical care time)  Medications Ordered in ED Medications  sodium chloride flush (NS) 0.9 % injection 3 mL (has no administration in time range)  cefTRIAXone (ROCEPHIN) 2 g in sodium chloride 0.9 % 100 mL IVPB (has no administration in time range)  0.9 %  sodium chloride infusion (has no administration in time range)  vancomycin (VANCOCIN) 1,500 mg in sodium chloride 0.9 % 500 mL IVPB (has no administration in time range)  vancomycin (VANCOCIN) IVPB 750 mg/150 ml premix (has no administration in time range)  sodium chloride 0.9 % bolus 1,000 mL (0 mLs Intravenous Stopped 07/15/18 2035)  clindamycin (CLEOCIN) IVPB 600 mg (0 mg Intravenous Stopped 07/15/18 2226)  lidocaine-EPINEPHrine (XYLOCAINE W/EPI) 2 %-1:200000 (PF) injection 10 mL (10 mLs Intradermal Given by Other 07/15/18 2227)     Initial Impression / Assessment and Plan / ED Course  I have reviewed the triage vital signs and the nursing notes.  Pertinent labs & imaging results that were available during my care of the patient were  reviewed by me and considered in my medical decision making (see chart for details).    Patient tolerated incision and drainage of her wound well, and on repeat exam is awake and alert.  I we discussed both need for ongoing antibiotics, and consideration for rehabilitation as an outpatient,  should she improve here.    11:09 PM Following 2 L fluid resuscitation the patient has increasing lactic acidosis, heart rate remains elevated, though slightly down from arrival, and blood pressure remains sub-100 systolic. However, the patient is awake and alert, mentating appropriately, with a protected airway. Regardless, given concern for sepsis, patient will receive additional broad-spectrum antibiotics, continued fluids, given these concerns require admission for further monitoring, management.  Young female with polysubstance abuse presents with tachycardia, hypotension, abscess. Patient is afebrile, mentating appropriately, but with lactic acidosis, concern for sepsis, she required broad-spectrum antibiotics, admission for further monitoring, management after incision and drainage.  Final Clinical Impressions(s) / ED Diagnoses   Final diagnoses:  Sepsis without acute organ dysfunction, due to unspecified organism (HCC)  Abscess of axilla, right     Gerhard MunchLockwood, Leatta Alewine, MD 07/15/18 2311  CRITICAL CARE Performed by: Gerhard Munchobert Oprah Camarena Total critical care time: 35 minutes Critical care time was exclusive of separately billable procedures and treating other patients. Critical care was necessary to treat or prevent imminent or life-threatening deterioration. Critical care was time spent personally by me on the following activities: development of treatment plan with patient and/or surrogate as well as nursing, discussions with consultants, evaluation of patient's response to treatment, examination of patient, obtaining history from patient or surrogate, ordering and performing treatments and interventions, ordering and review of laboratory studies, ordering and review of radiographic studies, pulse oximetry and re-evaluation of patient's condition.     Gerhard MunchLockwood, Panda Crossin, MD 07/22/18 [phone]

## 2018-07-15 NOTE — Progress Notes (Signed)
Pharmacy Antibiotic Note  [name] is a 28 y.o. female admitted on 07/15/2018 with cellulitis/sepsis.  Pharmacy has been consulted for vancomycin dosing. Also with ceftriaxone ordered x 1 per EDP. SCr 0.99 on admit.  Plan: Ceftriaxone 2g IV x 1 Vancomycin 1500mg  IV x1; then Vancomycin 750 mg IV Q 12 hrs. Goal AUC 400-550. Expected AUC: 511 SCr used: 0.99 Monitor clinical progress, c/s, renal function F/u de-escalation plan/LOT, vancomycin levels as indicated       Temp (24hrs), Avg:98.5 F (36.9 C), Min:98.5 F (36.9 C), Max:98.5 F (36.9 C)  Recent Labs  Lab 07/15/18 1940 07/15/18 2128  WBC 11.1*  --   CREATININE 0.99  --   LATICACIDVEN 2.0* 3.2*    CrCl cannot be calculated (Unknown ideal weight.).    No Known Allergies  Babs Bertin, PharmD, BCPS Please check AMION for all [facility] contact numbers Clinical Pharmacist 07/15/2018 10:47 PM

## 2018-07-16 ENCOUNTER — Other Ambulatory Visit: Payer: Self-pay

## 2018-07-16 DIAGNOSIS — L02411 Cutaneous abscess of right axilla: Secondary | ICD-10-CM

## 2018-07-16 DIAGNOSIS — R768 Other specified abnormal immunological findings in serum: Secondary | ICD-10-CM

## 2018-07-16 DIAGNOSIS — F322 Major depressive disorder, single episode, severe without psychotic features: Secondary | ICD-10-CM

## 2018-07-16 DIAGNOSIS — F1990 Other psychoactive substance use, unspecified, uncomplicated: Secondary | ICD-10-CM

## 2018-07-16 LAB — COMPREHENSIVE METABOLIC PANEL
ALT: 152 U/L — ABNORMAL HIGH (ref 0–44)
AST: 56 U/L — ABNORMAL HIGH (ref 15–41)
Albumin: 2.5 g/dL — ABNORMAL LOW (ref 3.5–5.0)
Alkaline Phosphatase: 51 U/L (ref 38–126)
Anion gap: 7 (ref 5–15)
BUN: 6 mg/dL (ref 6–20)
CO2: 23 mmol/L (ref 22–32)
Calcium: 8.4 mg/dL — ABNORMAL LOW (ref 8.9–10.3)
Chloride: 109 mmol/L (ref 98–111)
Creatinine, Ser: 0.75 mg/dL (ref 0.44–1.00)
GFR calc Af Amer: >60 mL/min (ref >60)
GFR calc non Af Amer: >60 mL/min (ref >60)
Glucose, Bld: 103 mg/dL — ABNORMAL HIGH (ref 70–99)
Potassium: 3.7 mmol/L (ref 3.5–5.1)
Sodium: 139 mmol/L (ref 135–145)
Total Bilirubin: 0.7 mg/dL (ref 0.3–1.2)
Total Protein: 5.1 g/dL — ABNORMAL LOW (ref 6.5–8.1)

## 2018-07-16 LAB — CBC
HCT: 35.7 % — ABNORMAL LOW (ref 36.0–46.0)
Hemoglobin: 11.7 g/dL — ABNORMAL LOW (ref 12.0–15.0)
MCH: 31.8 pg (ref 26.0–34.0)
MCHC: 32.8 g/dL (ref 30.0–36.0)
MCV: 97 fL (ref 80.0–100.0)
Platelets: 152 10*3/uL (ref 150–400)
RBC: 3.68 MIL/uL — ABNORMAL LOW (ref 3.87–5.11)
RDW: 12.4 % (ref 11.5–15.5)
WBC: 9.2 10*3/uL (ref 4.0–10.5)
nRBC: 0 % (ref 0.0–0.2)

## 2018-07-16 LAB — BLOOD CULTURE ID PANEL (REFLEXED)

## 2018-07-16 LAB — URINALYSIS, ROUTINE W REFLEX MICROSCOPIC
Bilirubin Urine: NEGATIVE
Glucose, UA: 50 mg/dL — AB
Hgb urine dipstick: NEGATIVE
Ketones, ur: NEGATIVE mg/dL
Nitrite: NEGATIVE
Protein, ur: NEGATIVE mg/dL
Specific Gravity, Urine: 1.006 (ref 1.005–1.030)
pH: 6 (ref 5.0–8.0)

## 2018-07-16 LAB — RAPID URINE DRUG SCREEN, HOSP PERFORMED
Amphetamines: POSITIVE — AB
Barbiturates: NOT DETECTED
Benzodiazepines: NOT DETECTED
Cocaine: NOT DETECTED
Opiates: POSITIVE — AB
Tetrahydrocannabinol: POSITIVE — AB

## 2018-07-16 LAB — SARS CORONAVIRUS 2 BY RT PCR (HOSPITAL ORDER, PERFORMED IN ~~LOC~~ HOSPITAL LAB): SARS Coronavirus 2: NEGATIVE

## 2018-07-16 MED ORDER — SODIUM CHLORIDE 0.9 % IV SOLN
INTRAVENOUS | Status: DC | PRN
  Administered 2018-07-16: 250 mL via INTRAVENOUS

## 2018-07-16 MED ORDER — ENSURE ENLIVE PO LIQD
237.0000 mL | Freq: Two times a day (BID) | ORAL | Status: DC
  Administered 2018-07-16 – 2018-07-18 (×5): 237 mL via ORAL

## 2018-07-16 MED ORDER — ADULT MULTIVITAMIN W/MINERALS CH
1.0000 | ORAL_TABLET | Freq: Every day | ORAL | Status: DC
  Administered 2018-07-16 – 2018-07-18 (×3): 1 via ORAL
  Filled 2018-07-16 (×3): qty 1

## 2018-07-16 NOTE — Progress Notes (Signed)
PHARMACY - PHYSICIAN COMMUNICATION CRITICAL VALUE ALERT - BLOOD CULTURE IDENTIFICATION (BCID)  [name] is an 28 y.o. female who presented to [facility] on 07/15/2018 with a chief complaint of abscess.  Assessment:  Blood cultures drawn on 6/1:   1/3 bottles (anaerobic only) growing Serratia marcescens with no resistance gene detected  Name of physician (or Provider) Contacted: Dr. Ronaldo Miyamoto  Current antibiotics: Ceftriaxone, vancomycin  Changes to prescribed antibiotics recommended: MD accepted  Discontinue vancomycin  Continue ceftriaxone  Results for orders placed or performed during the hospital encounter of 07/15/18  Blood Culture ID Panel (Reflexed) (Collected: 07/15/2018  7:20 PM)  Result Value Ref Range   Enterococcus species NOT DETECTED NOT DETECTED   Listeria monocytogenes NOT DETECTED NOT DETECTED   Staphylococcus species NOT DETECTED NOT DETECTED   Staphylococcus aureus (BCID) NOT DETECTED NOT DETECTED   Streptococcus species NOT DETECTED NOT DETECTED   Streptococcus agalactiae NOT DETECTED NOT DETECTED   Streptococcus pneumoniae NOT DETECTED NOT DETECTED   Streptococcus pyogenes NOT DETECTED NOT DETECTED   Acinetobacter baumannii NOT DETECTED NOT DETECTED   Enterobacteriaceae species DETECTED (A) NOT DETECTED   Enterobacter cloacae complex NOT DETECTED NOT DETECTED   Escherichia coli NOT DETECTED NOT DETECTED   Klebsiella oxytoca NOT DETECTED NOT DETECTED   Klebsiella pneumoniae NOT DETECTED NOT DETECTED   Proteus species NOT DETECTED NOT DETECTED   Serratia marcescens DETECTED (A) NOT DETECTED   Carbapenem resistance NOT DETECTED NOT DETECTED   Haemophilus influenzae NOT DETECTED NOT DETECTED   Neisseria meningitidis NOT DETECTED NOT DETECTED   Pseudomonas aeruginosa NOT DETECTED NOT DETECTED   Candida albicans NOT DETECTED NOT DETECTED   Candida glabrata NOT DETECTED NOT DETECTED   Candida krusei NOT DETECTED NOT DETECTED   Candida parapsilosis NOT  DETECTED NOT DETECTED   Candida tropicalis NOT DETECTED NOT DETECTED    [name], PharmD PGY1 Pharmacy Resident Phone [phone] 07/16/2018       3:47 PM

## 2018-07-16 NOTE — Progress Notes (Signed)
Marland Kitchen  PROGRESS NOTE    [name]  DYJ:[id number] DOB: 1990/12/15 DOA: 07/15/2018 PCP: Patient, No Pcp Per   Brief Narrative:   [name] is a 28 y.o. female with medical history significant for bipolar disorder/depression, hepatitis C, and substance use disorder who presents to the ED for evaluation of abscess in her right axilla.  Patient first noticed the abscess about 5 days ago.  She says she has had similar lesions in the past which resolved on their own therefore she did not think much of it at this time.  She had progressive swelling and pain at the area and developed symptoms of subjective fevers, chills, diaphoresis, nausea, vomiting, and malaise.  She has had intermittent lightheadedness without syncope.  She admits to IV heroin use, previously clean for 2 years however relapsed about 1 month ago.  She reports last use approximately 10 hours prior to admission.  She does report a history of withdrawal.  She also admits to methamphetamine and marijuana use.  She smokes about 0.5 PPD cigarettes.  She reports occasional alcohol use.   Assessment & Plan:   Principal Problem:   Sepsis (HCC) Active Problems:   MDD (major depressive disorder), severe (HCC)   Substance use disorder   Abscess of right axilla   Hepatitis C antibody test positive   Sepsis due to right axillary abscess:     - Likely secondary to injection drug use, high suspicion for bacteremia.     - s/p I&D by ED physician 07/15/2018     - Continue IV vancomycin and ceftriaxone     - Follow-up blood cultures     - temperature curve and WBCs better  Substance use disorder:     - Patient reports IV heroin use, last use approximately 10 hours prior to admission.  Also reports methamphetamine use.       - She has a reported history of opiate withdrawal.     - Monitor clinical opiate withdrawal scale     - sublingual Suboxone to prevent withdrawal, adjust as needed  Mood disorder:     - History of bipolar  disorder and major depression.       - Patient believes she is currently taking Lamictal and no longer on fluoxetine as listed in current medication list.  Elevated LFTs     - History of positive hepatitis C antibody:     - Positive hepatitis C antibody 12/07/2014, RNA was not detected at that time.      - repeat RNA.       - Patient counseled that if elevated, it is recommended she follow-up with PCP/infectious disease for further management and treatment. She understands and is agreeable.  Tobacco use:     - Currently smoking about 0.5 PPD.       - Nicotine patch ordered.   DVT prophylaxis: lovenox Code Status: FULL   Disposition Plan: TBD   Antimicrobials:  . Vanc, ceftriaxone    Subjective: "Do I have to have suboxone?"  Objective: Vitals:   07/16/18 0400 07/16/18 0416 07/16/18 0514 07/16/18 1120  BP: 129/66 122/71  103/60  Pulse: (!) 109 (!) 106 95 79  Resp: 17 15  18   Temp:  98.3 F (36.8 C) 99.4 F (37.4 C) 98.6 F (37 C)  TempSrc:  Oral Oral Oral  SpO2: 100% 100% 100% 99%  Weight:   58.8 kg   Height:   5\' 6"  (1.676 m)     Intake/Output Summary (Last  24 hours) at 07/16/2018 1456 Last data filed at 07/16/2018 0900 Gross per 24 hour  Intake 2496.5 ml  Output -  Net 2496.5 ml   Filed Weights   07/16/18 0514  Weight: 58.8 kg    Examination:  General: 28 y.o. female resting in bed in NAD Cardiovascular: RRR, +S1, S2, no m/g/r, equal pulses throughout Respiratory: CTABL, no w/r/r, normal WOB GI: BS+, NDNT, no masses noted, no organomegaly noted MSK: No e/c/c Skin: No rashes, bruises, ulcerations noted; multiple tattoos noted Neuro: A&O x 3, no focal deficits    Data Reviewed: I have personally reviewed following labs and imaging studies.  CBC: Recent Labs  Lab 07/15/18 1940 07/16/18 0840  WBC 11.1* 9.2  NEUTROABS 10.4*  --   HGB 14.2 11.7*  HCT 42.7 35.7*  MCV 96.4 97.0  PLT 194 152   Basic Metabolic Panel: Recent Labs  Lab 07/15/18  1940 07/16/18 0840  NA 134* 139  K 3.2* 3.7  CL 99 109  CO2 24 23  GLUCOSE 108* 103*  BUN 10 6  CREATININE 0.99 0.75  CALCIUM 9.3 8.4*   GFR: Estimated Creatinine Clearance: 97.2 mL/min (by C-G formula based on SCr of 0.75 mg/dL). Liver Function Tests: Recent Labs  Lab 07/15/18 1940 07/16/18 0840  AST 112* 56*  ALT 239* 152*  ALKPHOS 76 51  BILITOT 1.5* 0.7  PROT 6.7 5.1*  ALBUMIN 3.5 2.5*   No results for input(s): LIPASE, AMYLASE in the last 168 hours. No results for input(s): AMMONIA in the last 168 hours. Coagulation Profile: Recent Labs  Lab 07/15/18 1940  INR 1.2   Cardiac Enzymes: No results for input(s): CKTOTAL, CKMB, CKMBINDEX, TROPONINI in the last 168 hours. BNP (last 3 results) No results for input(s): PROBNP in the last 8760 hours. HbA1C: No results for input(s): HGBA1C in the last 72 hours. CBG: No results for input(s): GLUCAP in the last 168 hours. Lipid Profile: No results for input(s): CHOL, HDL, LDLCALC, TRIG, CHOLHDL, LDLDIRECT in the last 72 hours. Thyroid Function Tests: No results for input(s): TSH, T4TOTAL, FREET4, T3FREE, THYROIDAB in the last 72 hours. Anemia Panel: No results for input(s): VITAMINB12, FOLATE, FERRITIN, TIBC, IRON, RETICCTPCT in the last 72 hours. Sepsis Labs: Recent Labs  Lab 07/15/18 1940 07/15/18 2128  LATICACIDVEN 2.0* 3.2*    Recent Results (from the past 240 hour(s))  Culture, blood (Routine x 2)     Status: None (Preliminary result)   Collection Time: 07/15/18  7:20 PM  Result Value Ref Range Status   Specimen Description BLOOD LEFT ARM  Final   Special Requests   Final    BOTTLES DRAWN AEROBIC AND ANAEROBIC Blood Culture results may not be optimal due to an excessive volume of blood received in culture bottles   Culture  Setup Time   Final    GRAM NEGATIVE RODS ANAEROBIC BOTTLE ONLY Organism ID to follow Performed at [facility], [address] [place], [place] [id number]    Culture GRAM  NEGATIVE RODS  Final   Report Status PENDING  Incomplete  Culture, blood (Routine x 2)     Status: None (Preliminary result)   Collection Time: 07/15/18  7:37 PM  Result Value Ref Range Status   Specimen Description BLOOD RIGHT ARM  Final   Special Requests   Final    BOTTLES DRAWN AEROBIC ONLY Blood Culture adequate volume   Culture   Final    NO GROWTH < 12 HOURS Performed at [facility], [address] [place], [place] [address]    Report Status PENDING  Incomplete  SARS Coronavirus 2 (CEPHEID - Performed in [facility] hospital lab), Hosp Order     Status: None   Collection Time: 07/15/18 10:47 PM  Result Value Ref Range Status   SARS Coronavirus 2 NEGATIVE NEGATIVE Final    Comment: (NOTE) If result is NEGATIVE SARS-CoV-2 target nucleic acids are NOT DETECTED. The SARS-CoV-2 RNA is generally detectable in upper and lower  respiratory specimens during the acute phase of infection. The lowest  concentration of SARS-CoV-2 viral copies this assay can detect is 250  copies / mL. A negative result does not preclude SARS-CoV-2 infection  and should not be used as the sole basis for treatment or other  patient management decisions.  A negative result may occur with  improper specimen collection / handling, submission of specimen other  than nasopharyngeal swab, presence of viral mutation(s) within the  areas targeted by this assay, and inadequate number of viral copies  (<250 copies / mL). A negative result must be combined with clinical  observations, patient history, and epidemiological information. If result is POSITIVE SARS-CoV-2 target nucleic acids are DETECTED. The SARS-CoV-2 RNA is generally detectable in upper and lower  respiratory specimens dur ing the acute phase of infection.  Positive  results are indicative of active infection with SARS-CoV-2.  Clinical  correlation with patient history and other diagnostic information is  necessary to determine patient  infection status.  Positive results do  not rule out bacterial infection or co-infection with other viruses. If result is PRESUMPTIVE POSTIVE SARS-CoV-2 nucleic acids MAY BE PRESENT.   A presumptive positive result was obtained on the submitted specimen  and confirmed on repeat testing.  While 2019 novel coronavirus  (SARS-CoV-2) nucleic acids may be present in the submitted sample  additional confirmatory testing may be necessary for epidemiological  and / or clinical management purposes  to differentiate between  SARS-CoV-2 and other Sarbecovirus currently known to infect humans.  If clinically indicated additional testing with an alternate test  methodology [phone]) is advised. The SARS-CoV-2 RNA is generally  detectable in upper and lower respiratory sp ecimens during the acute  phase of infection. The expected result is Negative. Fact Sheet for Patients:  BoilerBrush.com.cy Fact Sheet for Healthcare Providers: https://pope.com/ This test is not yet approved or cleared by the [place] FDA and has been authorized for detection and/or diagnosis of SARS-CoV-2 by FDA under an Emergency Use Authorization (EUA).  This EUA will remain in effect (meaning this test can be used) for the duration of the COVID-19 declaration under Section 564(b)(1) of the Act, 21 U.S.C. section 360bbb-3(b)(1), unless the authorization is terminated or revoked sooner. Performed at [facility], [address], [place], [place] [address]          Radiology Studies: Dg Chest Port 1 View  Result Date: 07/15/2018 CLINICAL DATA:  Sepsis EXAM: PORTABLE CHEST 1 VIEW COMPARISON:  Chest x-ray dated 03/01/2015 FINDINGS: There is a rounded opacity at the left lung base. There is mild volume overload. No pneumothorax. No large area of consolidation. The cardiac silhouette is normal with respect to size. There is no acute osseous abnormality. IMPRESSION: 1.  Mild volume overload.  No large area of consolidation. 2. Oval density projecting over the left lower lobe may represent a developing infiltrate. A 4-6 week follow-up two-view chest x-ray is recommended to confirm resolution of this finding. Electronically Signed  By: Katherine Mantle M.D.   On: 07/15/2018 23:36        Scheduled Meds: . buprenorphine-naloxone  1 tablet Sublingual QHS  . enoxaparin (LOVENOX) injection  40 mg Subcutaneous Q24H  . feeding supplement (ENSURE ENLIVE)  237 mL Oral BID BM  . multivitamin with minerals  1 tablet Oral Daily  . nicotine  21 mg Transdermal Daily  . sodium chloride flush  3 mL Intravenous Once   Continuous Infusions: . cefTRIAXone (ROCEPHIN)  IV    . vancomycin 750 mg (07/16/18 1109)     LOS: 1 day    Time spent: 25 minutes spent in the coordination of care today.    Teddy Spike, DO [facility] Pager [phone]  If 7PM-7AM, please contact night-coverage [email or web address] Password [email or web address] 07/16/2018, 2:56 PM

## 2018-07-16 NOTE — Clinical Social Work Note (Signed)
CSW acknowledges substance abuse consult. Went by room to discuss resources but she was asleep and would not wake up to CSW calling her name. RNCM said she spoke to patient today and patient declined resources. Please consult again if patient is indeed interested.  CSW signing off.  Charlynn Court, CSW [phone]

## 2018-07-16 NOTE — TOC Initial Note (Signed)
Transition of Care [facility](TOC) - Initial/Assessment Note    Patient Details  Name: [name] MRN: [id number] Date of Birth: Mar 30, 1990  Transition of Care [facility](TOC) CM/SW Contact:    Reola MosherChandler, Alyze Lauf L, RN,MHA,BSN Phone Number: [phone] 07/16/2018, 10:05 AM  Clinical Narrative:                 Patient lives with friends; no PCP, no medical insurance; pt is agreeable to go to the [facility] care; Financial Counselor to screen patient to determine what the pt might qualify for. CM talked to patient about polysubstance abuse. Patient does not want any assistance, stated " I have information about that at home." Attending MD at discharge, [facility]. CM will continue to follow for progression of care.  Expected Discharge Plan: Home/Self Care Barriers to Discharge: No Barriers Identified   Patient Goals and CMS Choice Patient states their goals for this hospitalization and ongoing recovery are:: to get out of here CMS Medicare.gov Compare Post Acute Care list provided to:: Patient Choice offered to / list presented to : NA  Expected Discharge Plan and Services Expected Discharge Plan: Home/Self Care In-house Referral: NA, Financial Counselor Discharge Planning Services: CM Consult, [facility] Post Acute Care Choice: NA Living arrangements for the past 2 months: Apartment Expected Discharge Date: 07/17/18               DME Arranged: N/A DME Agency: NA         HH Agency: NA        Prior Living Arrangements/Services Living arrangements for the past 2 months: Apartment Lives with:: Friends   Do you feel safe going back to the place where you live?: Yes      Need for Family Participation in Patient Care: No (Comment) Care giver support system in place?: Yes (comment)   Criminal Activity/Legal Involvement Pertinent to Current Situation/Hospitalization: Yes - Comment as needed  Activities of Daily  Living Home Assistive Devices/Equipment: None ADL Screening (condition at time of admission) Patient's cognitive ability adequate to safely complete daily activities?: Yes Is the patient deaf or have difficulty hearing?: No Does the patient have difficulty seeing, even when wearing glasses/contacts?: No Does the patient have difficulty concentrating, remembering, or making decisions?: No Patient able to express need for assistance with ADLs?: Yes Does the patient have difficulty dressing or bathing?: No Independently performs ADLs?: Yes (appropriate for developmental age) Does the patient have difficulty walking or climbing stairs?: No Weakness of Legs: None Weakness of Arms/Hands: Both  Permission Sought/Granted Permission sought to share information with : Case Manager Permission granted to share information with : Yes, Verbal Permission Granted              Emotional Assessment Appearance:: Developmentally appropriate Attitude/Demeanor/Rapport: Gracious Affect (typically observed): Accepting Orientation: : Oriented to Self, Oriented to  Time, Oriented to Place, Oriented to Situation Alcohol / Substance Use: Alcohol Use, Tobacco Use, Illicit Drugs Psych Involvement: No (comment)  Admission diagnosis:  Abscess of axilla, right [L02.411] Sepsis without acute organ dysfunction, due to unspecified organism [facility](HCC) [A41.9] Patient Active Problem List   Diagnosis Date Noted  . Sepsis (HCC) 07/15/2018  . Substance use disorder 07/15/2018  . Abscess of right axilla 07/15/2018  . Hepatitis C antibody test positive 07/15/2018  . MDD (major depressive disorder), severe (HCC) 05/31/2018  . 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 Status post tubal ligation 11/21/2015  .  Postpartum care following vaginal delivery 11/20/2015  . Post term pregnancy at [redacted] weeks gestation 11/19/2015  . PTSD (post-traumatic stress disorder)   . Depression    . Bipolar 1 disorder (HCC)   . Major depression 02/16/2013  . Alcohol abuse 02/16/2013  . Anxiety state, unspecified 02/16/2013   PCP:  Patient, No Pcp Per Pharmacy:   [facility] [address] [place], [place] - [address] [place] [address] Phone: [phone] Fax: [phone]     Social Determinants of Health (SDOH) Interventions    Readmission Risk Interventions No flowsheet data found.

## 2018-07-16 NOTE — Progress Notes (Signed)
Initial Nutrition Assessment  RD working remotely.  DOCUMENTATION CODES:   Not applicable  INTERVENTION:   -Continue Ensure Enlive po BID, each supplement provides 350 kcal and 20 grams of protein -MVI with minerals daily  NUTRITION DIAGNOSIS:   Increased nutrient needs related to acute illness(rt axillary abscess) as evidenced by estimated needs.  GOAL:   Patient will meet greater than or equal to 90% of their needs  MONITOR:   PO intake, Supplement acceptance, Labs, Weight trends, Skin, I & O's  REASON FOR ASSESSMENT:   Malnutrition Screening Tool    ASSESSMENT:   [name] is a 28 y.o. female with medical history significant for bipolar disorder/depression, hepatitis C, and substance use disorder who presents to the ED for evaluation of abscess in her right axilla.  Patient first noticed the abscess about 5 days ago.  She says she has had similar lesions in the past which resolved on their own therefore she did not think much of it at this time.  She had progressive swelling and pain at the area and developed symptoms of subjective fevers, chills, diaphoresis, nausea, vomiting, and malaise.  She has had intermittent lightheadedness without syncope.  Pt admitted with sepsis due to rt axillary abscess.   Per MD notes, highly suspicious of bacteremia secondary to IV drug use.   Reviewed I/O's: +2.1 L x 24 hours  Per CSW, pt sleeping soundly right before RD initiating to call. RD will not disturb pt.   Reviewed wt hx; noted pt wt has been stable over the past year, however, has history of mild wt loss.   Suspect inadequate oral intake secondary to IV drug use. Meal completion po 60%. Pt would highly benefit from addition of nutritional supplements.   Labs reviewed.   Diet Order:   Diet Order            Diet regular Room service appropriate? Yes; Fluid consistency: Thin  Diet effective now              EDUCATION NEEDS:   No education needs have been  identified at this time  Skin:  Skin Assessment: Skin Integrity Issues: Skin Integrity Issues:: Incisions Incisions: rt axilla  Last BM:  07/12/28  Height:   Ht Readings from Last 1 Encounters:  07/16/18 5\' 6"  (1.676 m)    Weight:   Wt Readings from Last 1 Encounters:  07/16/18 58.8 kg    Ideal Body Weight:  59.1 kg  BMI:  Body mass index is 20.92 kg/m.  Estimated Nutritional Needs:   Kcal:  1550-1750  Protein:  80-95 grams  Fluid:  > 1.5 L    Lamona Eimer A. Mayford Knife, RD, LDN, CDCES Registered Dietitian II Certified Diabetes Care and Education Specialist Pager: [phone] After hours Pager: [phone]

## 2018-07-16 NOTE — ED Notes (Signed)
ED TO INPATIENT HANDOFF REPORT  ED Nurse Name and Phone #:  [phone]  S Name/Age/Gender [name] 28 y.o. female Room/Bed: 040C/040C  Code Status   Code Status: Full Code  Home/SNF/Other Home Patient oriented to: self, place, time and situation Is this baseline? Yes   Triage Complete: Triage complete  Chief Complaint faint abscess nausea  Triage Note Pt c/o feeling weak, reports abscess under her right axilla x 4 days. 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 Hypotensive and tachycardic in triage.   Allergies No Known Allergies  Level of Care/Admitting Diagnosis ED Disposition    ED Disposition Condition Comment   Admit  Hospital Area: [facility] [[id number]]  Level of Care: Med-Surg [16]  Covid Evaluation: Screening Protocol (No Symptoms)  Diagnosis: Sepsis, due to unspecified organism, unspecified whether acute organ dysfunction present [facility]) [[id number]]  Admitting Physician: Charlsie Quest [[id number]]  Attending Physician: Charlsie Quest [[id number]]  Estimated length of stay: past midnight tomorrow  Certification:: I certify this patient will need inpatient services for at least 2 midnights  PT Class (Do Not Modify): Inpatient [101]  PT Acc Code (Do Not Modify): Private [1]       B Medical/Surgery History Past Medical History:  Diagnosis Date  . ADD (attention deficit disorder)   . Anxiety   . Bipolar 1 disorder (HCC)   . Depression   . HA (headache)   . Hepatitis   . Hypertension   . PTSD (post-traumatic stress disorder)   . Status post tubal ligation 11/21/2015   Postpartum   Past Surgical History:  Procedure Laterality Date  . MOUTH SURGERY    . TUBAL LIGATION Bilateral 11/21/2015   Procedure: POST PARTUM TUBAL LIGATION;  Surgeon: Conard Novak, MD;  Location: [facility] ORS;  Service: Gynecology;  Laterality: Bilateral;     A IV Location/Drains/Wounds Patient Lines/Drains/Airways Status   Active Line/Drains/Airways     Name:   Placement date:   Placement time:   Site:   Days:   Peripheral IV 07/15/18 Right Forearm   07/15/18    1950    Forearm   1   Incision (Closed) 11/21/15 Abdomen   11/21/15    1124     968          Intake/Output Last 24 hours  Intake/Output Summary (Last 24 hours) at 07/16/2018 0406 Last data filed at 07/16/2018 0150 Gross per 24 hour  Intake 1600 ml  Output -  Net 1600 ml    Labs/Imaging Results for orders placed or performed during the hospital encounter of 07/15/18 (from the past 48 hour(s))  Urinalysis, Routine w reflex microscopic     Status: Abnormal   Collection Time: 07/15/18 12:25 AM  Result Value Ref Range   Color, Urine YELLOW YELLOW   APPearance HAZY (A) CLEAR   Specific Gravity, Urine 1.006 1.005 - 1.030   pH 6.0 5.0 - 8.0   Glucose, UA 50 (A) NEGATIVE mg/dL   Hgb urine dipstick NEGATIVE NEGATIVE   Bilirubin Urine NEGATIVE NEGATIVE   Ketones, ur NEGATIVE NEGATIVE mg/dL   Protein, ur NEGATIVE NEGATIVE mg/dL   Nitrite NEGATIVE NEGATIVE   Leukocytes,Ua SMALL (A) NEGATIVE   WBC, UA 11-20 0 - 5 WBC/hpf   Bacteria, UA MANY (A) NONE SEEN   Squamous Epithelial / LPF 11-20 0 - 5   Mucus PRESENT     Comment: Performed at [facility], [address], [place], [place] [address]  Comprehensive metabolic panel     Status: Abnormal   Collection Time: 07/15/18  7:40 PM  Result Value Ref Range   Sodium 134 (L) 135 - 145 mmol/L   Potassium 3.2 (L) 3.5 - 5.1 mmol/L   Chloride 99 98 - 111 mmol/L   CO2 24 22 - 32 mmol/L   Glucose, Bld 108 (H) 70 - 99 mg/dL   BUN 10 6 - 20 mg/dL   Creatinine, Ser 4.09 0.44 - 1.00 mg/dL   Calcium 9.3 8.9 - 81.1 mg/dL   Total Protein 6.7 6.5 - 8.1 g/dL   Albumin 3.5 3.5 - 5.0 g/dL   AST 914 (H) 15 - 41 U/L   ALT 239 (H) 0 - 44 U/L   Alkaline Phosphatase 76 38 - 126 U/L   Total Bilirubin 1.5 (H) 0.3 - 1.2 mg/dL   GFR calc non Af Amer >60 >60 mL/min   GFR calc Af Amer >60 >60 mL/min   Anion gap 11 5 - 15    Comment: Performed at  [facility], [address], [place], [place] [address]  Lactic acid, plasma     Status: Abnormal   Collection Time: 07/15/18  7:40 PM  Result Value Ref Range   Lactic Acid, Venous 2.0 (HH) 0.5 - 1.9 mmol/L    Comment: CRITICAL RESULT CALLED TO, READ BACK BY AND VERIFIED WITH: J.BOOLSTER RN 2026 07/15/2018 MCCORMICK K Performed at [facility], [address], [place], [place] [address]   CBC with Differential     Status: Abnormal   Collection Time: 07/15/18  7:40 PM  Result Value Ref Range   WBC 11.1 (H) 4.0 - 10.5 K/uL   RBC 4.43 3.87 - 5.11 MIL/uL   Hemoglobin 14.2 12.0 - 15.0 g/dL   HCT 86.5 78.4 - 69.6 %   MCV 96.4 80.0 - 100.0 fL   MCH 32.1 26.0 - 34.0 pg   MCHC 33.3 30.0 - 36.0 g/dL   RDW 29.5 28.4 - 13.2 %   Platelets 194 150 - 400 K/uL   nRBC 0.0 0.0 - 0.2 %   Neutrophils Relative % 92 %   Neutro Abs 10.4 (H) 1.7 - 7.7 K/uL   Lymphocytes Relative 4 %   Lymphs Abs 0.4 (L) 0.7 - 4.0 K/uL   Monocytes Relative 2 %   Monocytes Absolute 0.2 0.1 - 1.0 K/uL   Eosinophils Relative 0 %   Eosinophils Absolute 0.0 0.0 - 0.5 K/uL   Basophils Relative 1 %   Basophils Absolute 0.1 0.0 - 0.1 K/uL   Immature Granulocytes 1 %   Abs Immature Granulocytes 0.05 0.00 - 0.07 K/uL    Comment: Performed at [facility], [address], [place], [place] [address]  Protime-INR     Status: Abnormal   Collection Time: 07/15/18  7:40 PM  Result Value Ref Range   Prothrombin Time 15.5 (H) 11.4 - 15.2 seconds   INR 1.2 0.8 - 1.2    Comment: (NOTE) INR goal varies based on device and disease states. Performed at [facility], [address] [place], [place] [address]   I-Stat beta hCG blood, ED     Status: None   Collection Time: 07/15/18  7:50 PM  Result Value Ref Range   I-stat hCG, quantitative <5.0 <5 mIU/mL   Comment 3            Comment:   GEST. AGE      CONC.  (mIU/mL)   <=  1 WEEK        5 - 50     2 WEEKS       50 - 500     3 WEEKS       100 - 10,000      4 WEEKS     1,000 - 30,000        FEMALE AND NON-PREGNANT FEMALE:     LESS THAN 5 mIU/mL   Lactic acid, plasma     Status: Abnormal   Collection Time: 07/15/18  9:28 PM  Result Value Ref Range   Lactic Acid, Venous 3.2 (HH) 0.5 - 1.9 mmol/L    Comment: CRITICAL RESULT CALLED TO, READ BACK BY AND VERIFIED WITH: Shepard [facility] RN 07/15/2018 AT 2208 BY H SOEWARDIMAN Performed at [facility], [address], [place], [place] [address]   SARS Coronavirus 2 (CEPHEID - Performed in [facility] hospital lab), Hosp Order     Status: None   Collection Time: 07/15/18 10:47 PM  Result Value Ref Range   SARS Coronavirus 2 NEGATIVE NEGATIVE    Comment: (NOTE) If result is NEGATIVE SARS-CoV-2 target nucleic acids are NOT DETECTED. The SARS-CoV-2 RNA is generally detectable in upper and lower  respiratory specimens during the acute phase of infection. The lowest  concentration of SARS-CoV-2 viral copies this assay can detect is 250  copies / mL. A negative result does not preclude SARS-CoV-2 infection  and should not be used as the sole basis for treatment or other  patient management decisions.  A negative result may occur with  improper specimen collection / handling, submission of specimen other  than nasopharyngeal swab, presence of viral mutation(s) within the  areas targeted by this assay, and inadequate number of viral copies  (<250 copies / mL). A negative result must be combined with clinical  observations, patient history, and epidemiological information. If result is POSITIVE SARS-CoV-2 target nucleic acids are DETECTED. The SARS-CoV-2 RNA is generally detectable in upper and lower  respiratory specimens dur ing the acute phase of infection.  Positive  results are indicative of active infection with SARS-CoV-2.  Clinical  correlation with patient history and other diagnostic information is  necessary to determine patient infection status.  Positive results do  not rule out bacterial  infection or co-infection with other viruses. If result is PRESUMPTIVE POSTIVE SARS-CoV-2 nucleic acids MAY BE PRESENT.   A presumptive positive result was obtained on the submitted specimen  and confirmed on repeat testing.  While 2019 novel coronavirus  (SARS-CoV-2) nucleic acids may be present in the submitted sample  additional confirmatory testing may be necessary for epidemiological  and / or clinical management purposes  to differentiate between  SARS-CoV-2 and other Sarbecovirus currently known to infect humans.  If clinically indicated additional testing with an alternate test  methodology [phone]) is advised. The SARS-CoV-2 RNA is generally  detectable in upper and lower respiratory sp ecimens during the acute  phase of infection. The expected result is Negative. Fact Sheet for Patients:  BoilerBrush.com.cy Fact Sheet for Healthcare Providers: https://pope.com/ This test is not yet approved or cleared by the [place] FDA and has been authorized for detection and/or diagnosis of SARS-CoV-2 by FDA under an Emergency Use Authorization (EUA).  This EUA will remain in effect (meaning this test can be used) for the duration of the COVID-19 declaration under Section 564(b)(1) of the Act, 21 U.S.C. section 360bbb-3(b)(1), unless the authorization is terminated or revoked sooner. Performed at [facility], [address], [place], [place] [address]    Dg Chest Port 1 View  Result Date: 07/15/2018 CLINICAL DATA:  Sepsis EXAM: PORTABLE CHEST 1 VIEW COMPARISON:  Chest x-ray dated 03/01/2015 FINDINGS: There is a rounded opacity at the left lung base. There is mild volume overload. No pneumothorax. No large area of consolidation. The cardiac silhouette is normal with respect to size. There is no acute osseous abnormality. IMPRESSION: 1. Mild volume overload.  No large area of consolidation. 2. Oval density projecting over the left  lower lobe may represent a developing infiltrate. A 4-6 week follow-up two-view chest x-ray is recommended to confirm resolution of this finding. Electronically Signed   By: Katherine Mantle M.D.   On: 07/15/2018 23:36    Pending Labs Unresulted Labs (From admission, onward)    Start     Ordered   07/16/18 0500  CBC  Tomorrow morning,   R     07/15/18 2321   07/16/18 0500  Comprehensive metabolic panel  Tomorrow morning,   R     07/15/18 2321   07/16/18 0500  HCV RNA quant rflx ultra or genotyp  Tomorrow morning,   R     07/15/18 2325   07/15/18 2321  HIV antibody (Routine Testing)  Add-on,   R     07/15/18 2321   07/15/18 2259  Urine rapid drug screen (hosp performed)  Once,   R     07/15/18 2258   07/15/18 1928  Culture, blood (Routine x 2)  BLOOD CULTURE X 2,   STAT     07/15/18 1927          Vitals/Pain Today's Vitals   07/16/18 0030 07/16/18 0116 07/16/18 0233 07/16/18 0245  BP: (!) 96/49 104/63 (!) 141/84 125/67  Pulse: (!) 109 (!) 104 (!) 110 (!) 107  Resp: 14 12 15  (!) 21  Temp:      TempSrc:      SpO2: 98% 100% 100% 100%  PainSc:        Isolation Precautions No active isolations  Medications Medications  sodium chloride flush (NS) 0.9 % injection 3 mL (3 mLs Intravenous Not Given 07/15/18 2000)  0.9 %  sodium chloride infusion (has no administration in time range)  vancomycin (VANCOCIN) IVPB 750 mg/150 ml premix (has no administration in time range)  cefTRIAXone (ROCEPHIN) 2 g in sodium chloride 0.9 % 100 mL IVPB (has no administration in time range)  enoxaparin (LOVENOX) injection 40 mg (has no administration in time range)  acetaminophen (TYLENOL) tablet 650 mg (has no administration in time range)    Or  acetaminophen (TYLENOL) suppository 650 mg (has no administration in time range)  ondansetron (ZOFRAN) tablet 4 mg (has no administration in time range)    Or  ondansetron (ZOFRAN) injection 4 mg (has no administration in time range)   buprenorphine-naloxone (SUBOXONE) 2-0.5 mg per SL tablet 1 tablet (1 tablet Sublingual Given 07/16/18 0113)  nicotine (NICODERM CQ - dosed in mg/24 hours) patch 21 mg (has no administration in time range)  sodium chloride 0.9 % bolus 1,000 mL (0 mLs Intravenous Stopped 07/15/18 2035)  clindamycin (CLEOCIN) IVPB 600 mg (0 mg Intravenous Stopped 07/15/18 2226)  lidocaine-EPINEPHrine (XYLOCAINE W/EPI) 2 %-1:200000 (PF) injection 10 mL (10 mLs Intradermal Given by Other 07/15/18 2227)  vancomycin (VANCOCIN) 1,500 mg in sodium chloride 0.9 % 500 mL IVPB (0 mg Intravenous Stopped 07/16/18 0150)  sodium chloride 0.9 % bolus 1,000 mL (0 mLs Intravenous Stopped  07/16/18 0023)  cefTRIAXone (ROCEPHIN) 2 g in sodium chloride 0.9 % 100 mL IVPB (0 g Intravenous Stopped 07/16/18 0007)  potassium chloride 20 MEQ/15ML (10%) solution 40 mEq (40 mEq Oral Given 07/16/18 0110)    Mobility walks Low fall risk   Focused Assessments Pulmonary Assessment Handoff:  Lung sounds:   O2 Device: Room Air        R Recommendations: See Admitting Provider Note  Report given to:   Additional Notes:

## 2018-07-17 ENCOUNTER — Inpatient Hospital Stay (HOSPITAL_COMMUNITY): Payer: Self-pay

## 2018-07-17 DIAGNOSIS — I361 Nonrheumatic tricuspid (valve) insufficiency: Secondary | ICD-10-CM

## 2018-07-17 LAB — CBC WITH DIFFERENTIAL/PLATELET
Abs Immature Granulocytes: 0.03 10*3/uL (ref 0.00–0.07)
Basophils Absolute: 0 10*3/uL (ref 0.0–0.1)
Basophils Relative: 1 %
Eosinophils Absolute: 0.2 10*3/uL (ref 0.0–0.5)
Eosinophils Relative: 3 %
HCT: 36 % (ref 36.0–46.0)
Hemoglobin: 11.7 g/dL — ABNORMAL LOW (ref 12.0–15.0)
Immature Granulocytes: 1 %
Lymphocytes Relative: 30 %
Lymphs Abs: 1.6 10*3/uL (ref 0.7–4.0)
MCH: 32 pg (ref 26.0–34.0)
MCHC: 32.5 g/dL (ref 30.0–36.0)
MCV: 98.4 fL (ref 80.0–100.0)
Monocytes Absolute: 0.6 10*3/uL (ref 0.1–1.0)
Monocytes Relative: 11 %
Neutro Abs: 3 10*3/uL (ref 1.7–7.7)
Neutrophils Relative %: 54 %
Platelets: 150 10*3/uL (ref 150–400)
RBC: 3.66 MIL/uL — ABNORMAL LOW (ref 3.87–5.11)
RDW: 12.4 % (ref 11.5–15.5)
WBC: 5.4 10*3/uL (ref 4.0–10.5)
nRBC: 0 % (ref 0.0–0.2)

## 2018-07-17 LAB — LACTIC ACID, PLASMA
Lactic Acid, Venous: 2.2 mmol/L (ref 0.5–1.9)
Lactic Acid, Venous: 1.3 mmol/L (ref 0.5–1.9)

## 2018-07-17 LAB — ECHOCARDIOGRAM COMPLETE
Height: 66 in
Weight: 2038.81 oz

## 2018-07-17 LAB — MAGNESIUM: Magnesium: 1.8 mg/dL (ref 1.7–2.4)

## 2018-07-17 LAB — RENAL FUNCTION PANEL
Albumin: 2.6 g/dL — ABNORMAL LOW (ref 3.5–5.0)
Anion gap: 9 (ref 5–15)
BUN: 8 mg/dL (ref 6–20)
CO2: 21 mmol/L — ABNORMAL LOW (ref 22–32)
Calcium: 8.4 mg/dL — ABNORMAL LOW (ref 8.9–10.3)
Chloride: 107 mmol/L (ref 98–111)
Creatinine, Ser: 0.71 mg/dL (ref 0.44–1.00)
GFR calc Af Amer: >60 mL/min (ref >60)
GFR calc non Af Amer: >60 mL/min (ref >60)
Glucose, Bld: 87 mg/dL (ref 70–99)
Phosphorus: 3.3 mg/dL (ref 2.5–4.6)
Potassium: 4 mmol/L (ref 3.5–5.1)
Sodium: 137 mmol/L (ref 135–145)

## 2018-07-17 LAB — TROPONIN I
Troponin I: <0.03 ng/mL (ref <0.03)
Troponin I: <0.03 ng/mL (ref <0.03)

## 2018-07-17 LAB — HIV ANTIBODY (ROUTINE TESTING W REFLEX): HIV Screen 4th Generation wRfx: NONREACTIVE

## 2018-07-17 LAB — D-DIMER, QUANTITATIVE: D-Dimer, Quant: 0.68 ug/mL-FEU — ABNORMAL HIGH (ref 0.00–0.50)

## 2018-07-17 MED ORDER — KETOROLAC TROMETHAMINE 15 MG/ML IJ SOLN
15.0000 mg | Freq: Three times a day (TID) | INTRAMUSCULAR | Status: DC | PRN
  Administered 2018-07-17 (×2): 15 mg via INTRAVENOUS
  Filled 2018-07-17 (×2): qty 1

## 2018-07-17 MED ORDER — MORPHINE SULFATE (PF) 2 MG/ML IV SOLN
0.5000 mg | Freq: Once | INTRAVENOUS | Status: AC
  Administered 2018-07-17: 0.5 mg via INTRAVENOUS
  Filled 2018-07-17: qty 1

## 2018-07-17 MED ORDER — SODIUM CHLORIDE 0.9 % IV BOLUS
1000.0000 mL | Freq: Once | INTRAVENOUS | Status: AC
  Administered 2018-07-17: 1000 mL via INTRAVENOUS

## 2018-07-17 MED ORDER — SODIUM CHLORIDE 0.9 % IV SOLN
INTRAVENOUS | Status: DC
  Administered 2018-07-17 – 2018-07-18 (×2): via INTRAVENOUS

## 2018-07-17 MED ORDER — NITROGLYCERIN 0.4 MG SL SUBL
SUBLINGUAL_TABLET | SUBLINGUAL | Status: AC
  Filled 2018-07-17: qty 1

## 2018-07-17 MED ORDER — NITROGLYCERIN 0.4 MG SL SUBL: 0.4000 mg | SUBLINGUAL_TABLET | SUBLINGUAL | Status: DC | PRN

## 2018-07-17 MED ORDER — PANTOPRAZOLE SODIUM 40 MG IV SOLR
40.0000 mg | Freq: Two times a day (BID) | INTRAVENOUS | Status: DC
  Administered 2018-07-17 – 2018-07-18 (×3): 40 mg via INTRAVENOUS
  Filled 2018-07-17 (×4): qty 40

## 2018-07-17 MED ORDER — LAMOTRIGINE 25 MG PO TABS: 25.0000 mg | ORAL_TABLET | Freq: Every day | ORAL | Status: DC

## 2018-07-17 MED ORDER — LAMOTRIGINE 100 MG PO TABS
100.0000 mg | ORAL_TABLET | Freq: Two times a day (BID) | ORAL | Status: DC
  Administered 2018-07-17 – 2018-07-18 (×3): 100 mg via ORAL
  Filled 2018-07-17 (×3): qty 1

## 2018-07-17 MED ORDER — SODIUM CHLORIDE 0.9 % IV BOLUS
500.0000 mL | Freq: Once | INTRAVENOUS | Status: AC
  Administered 2018-07-17: 500 mL via INTRAVENOUS

## 2018-07-17 MED ORDER — IOHEXOL 350 MG/ML SOLN
100.0000 mL | Freq: Once | INTRAVENOUS | Status: AC | PRN
  Administered 2018-07-17: 100 mL via INTRAVENOUS

## 2018-07-17 NOTE — Progress Notes (Signed)
PROGRESS NOTE    [name]  ZOX:[id number] DOB: 1990/09/28 DOA: 07/15/2018 PCP: Patient, No Pcp Per    Brief Narrative:  28 year old with past medical history significant for bipolar disorder/depression, hepatitis C and substance use disorder who presented to the ED for evaluation of abscess in her right axilla.  Patient first noted the abscess about 5 days prior to admission.  She states she has had similar lesions in the past which resolved on their own therefore she did not think much of it at that  time.  She had progressive swelling and pain at the area and developed symptoms of subjective fevers chills diaphoresis nausea vomiting.  She reports intermittent lightheaded without syncope.  She admits to use IV heroine previously clean for 2 years however relapsed about 1 month ago.  She reports last use approximately 10 hours prior to admission.  She does report history of withdrawal.  She also admits to methamphetamine and marijuana use.   Assessment & Plan:   Principal Problem:   Sepsis (HCC) Active Problems:   MDD (major depressive disorder), severe (HCC)   Substance use disorder   Abscess of right axilla   Hepatitis C antibody test positive   1-Sepsis secondary to Axilary abscess; Serratia Marcensce bacteremia -Secondary to axillary abscess. -Blood cultures positive for Serratia Marcescens.  -Antibiotics was changed to ceftriaxone. -We will repeat blood cultures. -I have ask general surgery to evaluate axillary abscess.  2-chest pain: EKG changes Patient complaining of chest pressure.  EKG with some ST changes.  Blood pressure 110 range.  We will avoid nitroglycerin.  We will give one-time dose of IV morphine. Echocardiogram ordered. Check d-dimer, troponins. Subsequently chest pressure resolved  3-substance use disorder. -Reports IV heroin use, last use approximately 10 hours prior to admission.  She also reported methamphetamine. -She was a started on Suboxone.  -She agreed to follow in the Suboxone clinic, will need to arrange follow-up.  Mood disorder: Will ask pharmacy to find prior home dose. Will resume lower dose tomorrow.   Elevated liver function tests: History of positive hepatitis C antibody: Hepatitis C antibody positive on 12/07/2014 RNA was not detected at that time.  Repeat RNA.  Tobacco use: Nicotine patch ordered.    Nutrition Problem: Increased nutrient needs Etiology: acute illness(rt axillary abscess)    Signs/Symptoms: estimated needs    Interventions: Ensure Enlive (each supplement provides 350kcal and 20 grams of protein), MVI  Estimated body mass index is 20.57 kg/m as calculated from the following:   Height as of this encounter:  (1.676 m).   Weight as of this encounter: 57.8 kg.   DVT prophylaxis: Lovenox Code Status: Full code Family Communication: Care discussed with patient Disposition Plan: Remain in the hospital for treatment of abscess but further evaluation of chest pain  Consultants:   Surgery   Procedures:   I&D of right axillary abscess   Antimicrobials:   Ceftriaxone   Subjective: She is complaining of right axillary pain, reports also pain all over. She agrees to follow with a Suboxone clinic I was called later by nurse that patient was complaining of chest pain.  Came back and evaluated the patient.  She was alert conversant.  She report chest pressure, worse with inspiration  Objective: Vitals:   07/16/18 1637 07/16/18 1919 07/16/18 2353 07/17/18 0513  BP: 104/62 111/80 117/79 108/71  Pulse: 76 99 92 66  Resp: Temp: 97.8 F (36.6 C) 98.1 F (36.7 C) 98.3 F (  36.8 C) 98 F (36.7 C)  TempSrc: Oral Oral Oral   SpO2: 99% 100% 100% 100%  Weight:    57.8 kg  Height:        Intake/Output Summary (Last 24 hours) at 07/17/2018 0757 Last data filed at 07/17/2018 0550 Gross per 24 hour  Intake 2062.91 ml  Output -  Net 2062.91 ml   Filed Weights   07/16/18  0514 07/17/18 0513  Weight: 58.8 kg 57.8 kg    Examination:  General exam: Appears calm and comfortable  Respiratory system: Clear to auscultation. Respiratory effort normal. Cardiovascular system: S1 & S2 heard, RRR. No JVD, murmurs, rubs, gallops or clicks. No pedal edema. Gastrointestinal system: Abdomen is nondistended, soft and nontender. No organomegaly or masses felt. Normal bowel sounds heard. Central nervous system: Alert and oriented. No focal neurological deficits. Extremities: Symmetric 5 x 5 power. Skin: No rashes, lesions or ulcers Psychiatry: Judgement and insight appear normal. Mood & affect appropriate.     Data Reviewed: I have personally reviewed following labs and imaging studies  CBC: Recent Labs  Lab 07/15/18 1940 07/16/18 0840  WBC 11.1* 9.2  NEUTROABS 10.4*  --   HGB 14.2 11.7*  HCT 42.7 35.7*  MCV 96.4 97.0  PLT 194 152   Basic Metabolic Panel: Recent Labs  Lab 07/15/18 1940 07/16/18 0840  NA 134* 139  K 3.2* 3.7  CL 99 109  CO2 24 23  GLUCOSE 108* 103*  BUN 10 6  CREATININE 0.99 0.75  CALCIUM 9.3 8.4*   GFR: Estimated Creatinine Clearance: 95.5 mL/min (by C-G formula based on SCr of 0.75 mg/dL). Liver Function Tests: Recent Labs  Lab 07/15/18 1940 07/16/18 0840  AST 112* 56*  ALT 239* 152*  ALKPHOS 76 51  BILITOT 1.5* 0.7  PROT 6.7 5.1*  ALBUMIN 3.5 2.5*   No results for input(s): LIPASE, AMYLASE in the last 168 hours. No results for input(s): AMMONIA in the last 168 hours. Coagulation Profile: Recent Labs  Lab 07/15/18 1940  INR 1.2   Cardiac Enzymes: No results for input(s): CKTOTAL, CKMB, CKMBINDEX, TROPONINI in the last 168 hours. BNP (last 3 results) No results for input(s): PROBNP in the last 8760 hours. HbA1C: No results for input(s): HGBA1C in the last 72 hours. CBG: No results for input(s): GLUCAP in the last 168 hours. Lipid Profile: No results for input(s): CHOL, HDL, LDLCALC, TRIG, CHOLHDL, LDLDIRECT in  the last 72 hours. Thyroid Function Tests: No results for input(s): TSH, T4TOTAL, FREET4, T3FREE, THYROIDAB in the last 72 hours. Anemia Panel: No results for input(s): VITAMINB12, FOLATE, FERRITIN, TIBC, IRON, RETICCTPCT in the last 72 hours. Sepsis Labs: Recent Labs  Lab 07/15/18 1940 07/15/18 2128  LATICACIDVEN 2.0* 3.2*    Recent Results (from the past 240 hour(s))  Culture, blood (Routine x 2)     Status: None (Preliminary result)   Collection Time: 07/15/18  7:20 PM  Result Value Ref Range Status   Specimen Description BLOOD LEFT ARM  Final   Special Requests   Final    BOTTLES DRAWN AEROBIC AND ANAEROBIC Blood Culture results may not be optimal due to an excessive volume of blood received in culture bottles   Culture  Setup Time   Final    GRAM NEGATIVE RODS IN BOTH AEROBIC AND ANAEROBIC BOTTLES CRITICAL RESULT CALLED TO, READ BACK BY AND VERIFIED WITH: M. [facility] PharmD 15:40 07/16/18 ([email or web address]) Performed at [facility], [address], [place], [place] [address]  Culture GRAM NEGATIVE RODS  Final   Report Status PENDING  Incomplete  Blood Culture ID Panel (Reflexed)     Status: Abnormal   Collection Time: 07/15/18  7:20 PM  Result Value Ref Range Status   Enterococcus species NOT DETECTED NOT DETECTED Final   Listeria monocytogenes NOT DETECTED NOT DETECTED Final   Staphylococcus species NOT DETECTED NOT DETECTED Final   Staphylococcus aureus (BCID) NOT DETECTED NOT DETECTED Final   Streptococcus species NOT DETECTED NOT DETECTED Final   Streptococcus agalactiae NOT DETECTED NOT DETECTED Final   Streptococcus pneumoniae NOT DETECTED NOT DETECTED Final   Streptococcus pyogenes NOT DETECTED NOT DETECTED Final   Acinetobacter baumannii NOT DETECTED NOT DETECTED Final   Enterobacteriaceae species DETECTED (A) NOT DETECTED Final    Comment: Enterobacteriaceae represent a large family of gram-negative bacteria, not a single organism. CRITICAL RESULT CALLED TO, READ  BACK BY AND VERIFIED WITH: M. Vivia EwingWheatley PharmD 15:40 07/16/18 ([email or web address])    Enterobacter cloacae complex NOT DETECTED NOT DETECTED Final   Escherichia coli NOT DETECTED NOT DETECTED Final   Klebsiella oxytoca NOT DETECTED NOT DETECTED Final   Klebsiella pneumoniae NOT DETECTED NOT DETECTED Final   Proteus species NOT DETECTED NOT DETECTED Final   Serratia marcescens DETECTED (A) NOT DETECTED Final    Comment: CRITICAL RESULT CALLED TO, READ BACK BY AND VERIFIED WITH: M. Vivia EwingWheatley PharmD 15:40 07/16/18 ([email or web address])    Carbapenem resistance NOT DETECTED NOT DETECTED Final   Haemophilus influenzae NOT DETECTED NOT DETECTED Final   Neisseria meningitidis NOT DETECTED NOT DETECTED Final   Pseudomonas aeruginosa NOT DETECTED NOT DETECTED Final   Candida albicans NOT DETECTED NOT DETECTED Final   Candida glabrata NOT DETECTED NOT DETECTED Final   Candida krusei NOT DETECTED NOT DETECTED Final   Candida parapsilosis NOT DETECTED NOT DETECTED Final   Candida tropicalis NOT DETECTED NOT DETECTED Final    Comment: Performed at [facility], [address] [place], [place] [address]  Culture, blood (Routine x 2)     Status: None (Preliminary result)   Collection Time: 07/15/18  7:37 PM  Result Value Ref Range Status   Specimen Description BLOOD RIGHT ARM  Final   Special Requests   Final    BOTTLES DRAWN AEROBIC ONLY Blood Culture adequate volume   Culture  Setup Time   Final    GRAM NEGATIVE RODS AEROBIC BOTTLE ONLY CRITICAL VALUE NOTED.  VALUE IS CONSISTENT WITH PREVIOUSLY REPORTED AND CALLED VALUE. Performed at [facility], [address] [place], [place] [address]    Culture GRAM NEGATIVE RODS  Final   Report Status PENDING  Incomplete  SARS Coronavirus 2 (CEPHEID - Performed in [facility] hospital lab), Hosp Order     Status: None   Collection Time: 07/15/18 10:47 PM  Result Value Ref Range Status   SARS Coronavirus 2 NEGATIVE NEGATIVE Final    Comment: (NOTE) If result is  NEGATIVE SARS-CoV-2 target nucleic acids are NOT DETECTED. The SARS-CoV-2 RNA is generally detectable in upper and lower  respiratory specimens during the acute phase of infection. The lowest  concentration of SARS-CoV-2 viral copies this assay can detect is 250  copies / mL. A negative result does not preclude SARS-CoV-2 infection  and should not be used as the sole basis for treatment or other  patient management decisions.  A negative result may occur with  improper specimen collection / handling, submission of specimen other  than nasopharyngeal swab, presence of viral mutation(s) within  the  areas targeted by this assay, and inadequate number of viral copies  (<250 copies / mL). A negative result must be combined with clinical  observations, patient history, and epidemiological information. If result is POSITIVE SARS-CoV-2 target nucleic acids are DETECTED. The SARS-CoV-2 RNA is generally detectable in upper and lower  respiratory specimens dur ing the acute phase of infection.  Positive  results are indicative of active infection with SARS-CoV-2.  Clinical  correlation with patient history and other diagnostic information is  necessary to determine patient infection status.  Positive results do  not rule out bacterial infection or co-infection with other viruses. If result is PRESUMPTIVE POSTIVE SARS-CoV-2 nucleic acids MAY BE PRESENT.   A presumptive positive result was obtained on the submitted specimen  and confirmed on repeat testing.  While 2019 novel coronavirus  (SARS-CoV-2) nucleic acids may be present in the submitted sample  additional confirmatory testing may be necessary for epidemiological  and / or clinical management purposes  to differentiate between  SARS-CoV-2 and other Sarbecovirus currently known to infect humans.  If clinically indicated additional testing with an alternate test  methodology [phone]) is advised. The SARS-CoV-2 RNA is generally  detectable  in upper and lower respiratory sp ecimens during the acute  phase of infection. The expected result is Negative. Fact Sheet for Patients:  BoilerBrush.com.cy Fact Sheet for Healthcare Providers: https://pope.com/ This test is not yet approved or cleared by the [place] FDA and has been authorized for detection and/or diagnosis of SARS-CoV-2 by FDA under an Emergency Use Authorization (EUA).  This EUA will remain in effect (meaning this test can be used) for the duration of the COVID-19 declaration under Section 564(b)(1) of the Act, 21 U.S.C. section 360bbb-3(b)(1), unless the authorization is terminated or revoked sooner. Performed at [facility], [address] [place], [place] [address]          Radiology Studies: Dg Chest Port 1 View  Result Date: 07/15/2018 CLINICAL DATA:  Sepsis EXAM: PORTABLE CHEST 1 VIEW COMPARISON:  Chest x-ray dated 03/01/2015 FINDINGS: There is a rounded opacity at the left lung base. There is mild volume overload. No pneumothorax. No large area of consolidation. The cardiac silhouette is normal with respect to size. There is no acute osseous abnormality. IMPRESSION: 1. Mild volume overload.  No large area of consolidation. 2. Oval density projecting over the left lower lobe may represent a developing infiltrate. A 4-6 week follow-up two-view chest x-ray is recommended to confirm resolution of this finding. Electronically Signed   By: Katherine Mantle M.D.   On: 07/15/2018 23:36        Scheduled Meds: . buprenorphine-naloxone  1 tablet Sublingual QHS  . enoxaparin (LOVENOX) injection  40 mg Subcutaneous Q24H  . feeding supplement (ENSURE ENLIVE)  237 mL Oral BID BM  . multivitamin with minerals  1 tablet Oral Daily  . nicotine  21 mg Transdermal Daily  . sodium chloride flush  3 mL Intravenous Once   Continuous Infusions: . sodium chloride 250 mL (07/16/18 2301)  . cefTRIAXone  (ROCEPHIN)  IV 2 g (07/16/18 2303)     LOS: 2 days    Time spent: 35 minutes.     Alba Cory, MD [facility] Pager [phone]  If 7PM-7AM, please contact night-coverage [email or web address] Password [facility] 07/17/2018, 7:57 AM

## 2018-07-17 NOTE — Plan of Care (Signed)

## 2018-07-17 NOTE — Progress Notes (Signed)
  Echocardiogram 2D Echocardiogram has been performed.  [name] 07/17/2018, 2:43 PM

## 2018-07-17 NOTE — Progress Notes (Signed)
[facility] Progress Note      Assessment/Plan R axillary abscess - S/P I&D, Dr. Jeraldine LootsLockwood, 06/01 - I removed iodoform and talked to nurse about wound care.  - Will place orders for wound care. Antibiotics per medicine  - wound is draining appropriately and no additional surgical intervention is needed at this time  We will sign off. Please page us with any further needs for your patient.    LOS: 2 days    Subjective: CC: Pt is a 28 y.o. female with medical history significant for bipolar disorder/depression, hepatitis C, and substance use disorder who presented to the ED for evaluation of abscess in her right axilla. She is S/P I&D by the EDP on 06/01 with packing of iodoform. Packing has not been removed since admission. We were asked to look at wound for management.   Pt states she has had these before but they have resolved on their own. She is having less pain.   Objective: Vital signs in last 24 hours: Temp:  [97.8 F (36.6 C)-98.3 F (36.8 C)] 98 F (36.7 C) (06/03 0513) Pulse Rate:  [66-99] 66 (06/03 0513) Resp:  [18] 18 (06/03 0513) BP: (104-117)/(62-80) 108/71 (06/03 0513) SpO2:  [99 %-100 %] 100 % (06/03 0513) Weight:  [57.8 kg] 57.8 kg (06/03 0513) Last BM Date: 07/13/18  Intake/Output from previous day: 06/02 0701 - 06/03 0700 In: 2302.9 [P.O.:1030; I.V.:1022.9; IV Piggyback:250] Out: -  Intake/Output this shift: Total I/O In: 240 [P.O.:240] Out: -   PE: Gen:  Alert, NAD, pleasant, cooperative Pulm:  Rate and effort normal Extremities: R axilla wound with iodoform removed and scant purulent drainage. Small wound opening. Minimal surrounding erythema, mild induration.  Skin: warm and dry   Anti-infectives: Anti-infectives (From admission, onward)   Start     Dose/Rate Route Frequency Ordered Stop   07/16/18 2200  cefTRIAXone (ROCEPHIN) 2 g in sodium chloride 0.9 % 100 mL IVPB     2 g 200 mL/hr over 30 Minutes Intravenous Every 24  hours 07/15/18 2320     07/16/18 1130  vancomycin (VANCOCIN) IVPB 750 mg/150 ml premix  Status:  Discontinued     750 mg 150 mL/hr over 60 Minutes Intravenous Every 12 hours 07/15/18 2247 07/16/18 1547   07/15/18 2330  cefTRIAXone (ROCEPHIN) 2 g in sodium chloride 0.9 % 100 mL IVPB     2 g 200 mL/hr over 30 Minutes Intravenous  Once 07/15/18 2323 07/16/18 0007   07/15/18 2300  vancomycin (VANCOCIN) 1,500 mg in sodium chloride 0.9 % 500 mL IVPB     1,500 mg 250 mL/hr over 120 Minutes Intravenous  Once 07/15/18 2245 07/16/18 0150   07/15/18 2245  vancomycin (VANCOCIN) IVPB 1000 mg/200 mL premix  Status:  Discontinued     1,000 mg 200 mL/hr over 60 Minutes Intravenous  Once 07/15/18 2241 07/15/18 2245   07/15/18 2245  cefTRIAXone (ROCEPHIN) 2 g in sodium chloride 0.9 % 100 mL IVPB  Status:  Discontinued     2 g 200 mL/hr over 30 Minutes Intravenous  Once 07/15/18 2241 07/15/18 2320   07/15/18 2000  clindamycin (CLEOCIN) IVPB 600 mg     600 mg 100 mL/hr over 30 Minutes Intravenous  Once 07/15/18 1957 07/15/18 2226      Lab Results:  Recent Labs    07/16/18 0840 07/17/18 0718  WBC 9.2 5.4  HGB 11.7* 11.7*  HCT 35.7* 36.0  PLT 152 150   BMET Recent Labs  07/16/18 0840 07/17/18 0718  NA 139 137  K 3.7 4.0  CL 109 107  CO2 23 21*  GLUCOSE 103* 87  BUN 6 8  CREATININE 0.75 0.71  CALCIUM 8.4* 8.4*   PT/INR Recent Labs    07/15/18 1940  LABPROT 15.5*  INR 1.2   CMP     Component Value Date/Time   NA 137 07/17/2018 0718   K 4.0 07/17/2018 0718   CL 107 07/17/2018 0718   CO2 21 (L) 07/17/2018 0718   GLUCOSE 87 07/17/2018 0718   BUN 8 07/17/2018 0718   CREATININE 0.71 07/17/2018 0718   CALCIUM 8.4 (L) 07/17/2018 0718   PROT 5.1 (L) 07/16/2018 0840   ALBUMIN 2.6 (L) 07/17/2018 0718   AST 56 (H) 07/16/2018 0840   ALT 152 (H) 07/16/2018 0840   ALKPHOS 51 07/16/2018 0840   BILITOT 0.7 07/16/2018 0840   GFRNONAA >60 07/17/2018 0718   GFRAA >60 07/17/2018 0718    Lipase     Component Value Date/Time   LIPASE 20 11/26/2015 2003    Studies/Results: Dg Chest Port 1 View  Result Date: 07/15/2018 CLINICAL DATA:  Sepsis EXAM: PORTABLE CHEST 1 VIEW COMPARISON:  Chest x-ray dated 03/01/2015 FINDINGS: There is a rounded opacity at the left lung base. There is mild volume overload. No pneumothorax. No large area of consolidation. The cardiac silhouette is normal with respect to size. There is no acute osseous abnormality. IMPRESSION: 1. Mild volume overload.  No large area of consolidation. 2. Oval density projecting over the left lower lobe may represent a developing infiltrate. A 4-6 week follow-up two-view chest x-ray is recommended to confirm resolution of this finding. Electronically Signed   By: Katherine Mantle M.D.   On: 07/15/2018 23:36      Jerre Simon , [facility] 07/17/2018, 11:40 AM  Pager: [phone] Mon-Wed, Friday 7:00am-4:30pm Thurs 7am-11:30am  Consults: [phone]

## 2018-07-17 NOTE — Progress Notes (Signed)
Patient Lactic acid 2.2  Md was on unit and she was informed

## 2018-07-18 LAB — CBC
HCT: 33.1 % — ABNORMAL LOW (ref 36.0–46.0)
Hemoglobin: 11.2 g/dL — ABNORMAL LOW (ref 12.0–15.0)
MCH: 32.4 pg (ref 26.0–34.0)
MCHC: 33.8 g/dL (ref 30.0–36.0)
MCV: 95.7 fL (ref 80.0–100.0)
Platelets: 143 10*3/uL — ABNORMAL LOW (ref 150–400)
RBC: 3.46 MIL/uL — ABNORMAL LOW (ref 3.87–5.11)
RDW: 12.3 % (ref 11.5–15.5)
WBC: 4.2 10*3/uL (ref 4.0–10.5)
nRBC: 0 % (ref 0.0–0.2)

## 2018-07-18 LAB — BASIC METABOLIC PANEL
Anion gap: 8 (ref 5–15)
BUN: <5 mg/dL — ABNORMAL LOW (ref 6–20)
CO2: 24 mmol/L (ref 22–32)
Calcium: 8.7 mg/dL — ABNORMAL LOW (ref 8.9–10.3)
Chloride: 108 mmol/L (ref 98–111)
Creatinine, Ser: 0.78 mg/dL (ref 0.44–1.00)
GFR calc Af Amer: >60 mL/min (ref >60)
GFR calc non Af Amer: >60 mL/min (ref >60)
Glucose, Bld: 112 mg/dL — ABNORMAL HIGH (ref 70–99)
Potassium: 3.7 mmol/L (ref 3.5–5.1)
Sodium: 140 mmol/L (ref 135–145)

## 2018-07-18 LAB — CULTURE, BLOOD (ROUTINE X 2): Special Requests: ADEQUATE

## 2018-07-18 LAB — TROPONIN I: Troponin I: <0.03 ng/mL (ref <0.03)

## 2018-07-18 MED ORDER — NICOTINE 21 MG/24HR TD PT24: 21.0000 mg | MEDICATED_PATCH | Freq: Every day | TRANSDERMAL | 0 refills | Status: DC

## 2018-07-18 MED ORDER — BUPRENORPHINE HCL-NALOXONE HCL 8-2 MG SL SUBL: 1.0000 | SUBLINGUAL_TABLET | Freq: Every day | SUBLINGUAL | 0 refills | Status: DC

## 2018-07-18 MED ORDER — BUPRENORPHINE HCL-NALOXONE HCL 8-2 MG SL SUBL
1.0000 | SUBLINGUAL_TABLET | Freq: Every day | SUBLINGUAL | Status: DC
  Administered 2018-07-18: 1 via SUBLINGUAL
  Filled 2018-07-18: qty 1

## 2018-07-18 MED ORDER — ACETAMINOPHEN 325 MG PO TABS: 650.0000 mg | ORAL_TABLET | Freq: Four times a day (QID) | ORAL | 0 refills | Status: DC | PRN

## 2018-07-18 MED ORDER — SULFAMETHOXAZOLE-TRIMETHOPRIM 800-160 MG PO TABS: 1.0000 | ORAL_TABLET | Freq: Two times a day (BID) | ORAL | 0 refills | Status: DC

## 2018-07-18 MED FILL — BUPRENORPHIN-NALOXON 8-2 MG: 8-2 | 7 days supply | Qty: 7 | Fill #0

## 2018-07-18 MED FILL — SULFAMETHOXAZOLE-TMP DS TAB: 800-160 | 10 days supply | Qty: 20 | Fill #0

## 2018-07-18 NOTE — Progress Notes (Signed)
   Dr. Sunnie Nielsen contacted me about management of this persons opioid use disorder.  I spoke with [name] over the phone this morning.  She is a 28 year old person who injects drugs admitted on June 1 for a skin and soft tissue infection that was managed with incision and drainage and then IV antibiotics.  Blood cultures positive for Serratia species, negative for staph or other gram positives.  Leukocytosis has resolved, no fevers.  Patient injects heroin about 3 times daily for several years, has moderate risk for withdrawal.  This was treated initially with a low-dose of Suboxone, he is feeling fairly well today though she is having some symptoms of withdrawal currently.  She has used Suboxone previously, but never in a formal treatment program.  Never used methadone.  Lives in [place].  Interested in outpatient treatment, barriers include uninsured status.  I would recommend increasing Suboxone to milligrams sublingual once daily.  I will provide a 1 week supply, sent electronically to the [facility] outpatient pharmacy.  This should be fairly affordable, less than $20.  Set up appointment for her at the [facility] internal medicine center for Tuesday, June 9 at 8:30 AM.  She should call us with any questions at [phone].  We will continue the Suboxone, may increase the dose at that time.  We will check for infection as well.  We can follow-up her HCV viral load and arrange for treatment if needed.   Tyson Alias, MD 07/18/2018, 9:11 AM

## 2018-07-18 NOTE — Discharge Summary (Signed)
Physician Discharge Summary  [name] ZOX:[id number] DOB: Nov 07, 1990 DOA: 07/15/2018  PCP: Patient, No Pcp Per  Admit date: 07/15/2018 Discharge date: 07/18/2018  Admitted From: Home  Disposition:  Home  Recommendations for Outpatient Follow-up:  1. Follow up with PCP in 1-2 weeks 2. Please obtain BMP/CBC in one week 3. Follow on resolution of infection 4. Follow up with suboxone clinic.  5. Follow Hepatitis C RNA 6. Repeat LFT   Home Health: none  Discharge Condition: stable.  CODE STATUS: full code Diet recommendation: Heart Healthy  Brief/Interim Summary: 28 year old with past medical history significant for bipolar disorder/depression, hepatitis C and substance use disorder who presented to the ED for evaluation of abscess in her right axilla.  Patient first noted the abscess about 5 days prior to admission.  She states she has had similar lesions in the past which resolved on their own therefore she did not think much of it at that  time.  She had progressive swelling and pain at the area and developed symptoms of subjective fevers chills diaphoresis nausea vomiting.  She reports intermittent lightheaded without syncope.  She admits to use IV heroine previously clean for 2 years however relapsed about 1 month ago.  She reports last use approximately 10 hours prior to admission.  She does report history of withdrawal.  She also admits to methamphetamine and marijuana use.  1-Sepsis secondary to Axilary abscess; Serratia Marcensce bacteremia -Secondary to axillary abscess. -Blood cultures positive for Serratia Marcescens.  -Antibiotics was changed to ceftriaxone. -repeated blood culture 6-03 no growth to date.  -appreciate surgery evaluation and recommendation.  Discharge on bactrim for 10 days.   2-chest pain: EKG changes Patient complaining of chest pressure.  EKG with some ST changes.  Blood pressure 110 range.  We will avoid nitroglycerin.  received one time dose  morphine Echocardiogram NL EF.  CTA negative for PE.  Subsequently chest pressure resolved.   3-substance use disorder. -Reports IV heroin use, last use approximately 10 hours prior to admission.  She also reported methamphetamine. -She was a started on Suboxone. -She agreed to follow in the Suboxone clinic.  Appreciate Dr Para March. Will increase suboxon to 8 mg tables. Patient has appointment with him on Tuesday morning.   Mood disorder: Resume lamotrigine.   Elevated liver function tests: History of positive hepatitis C antibody: Hepatitis C antibody positive on 12/07/2014 RNA was not detected at that time.  Repeat RNA, pending at this time.    Discharge Diagnoses:  Principal Problem:   Sepsis (HCC) Active Problems:   MDD (major depressive disorder), severe (HCC)   Substance use disorder   Abscess of right axilla   Hepatitis C antibody test positive    Discharge Instructions  Discharge Instructions    Diet - low sodium heart healthy   Complete by:  As directed    Increase activity slowly   Complete by:  As directed      Allergies as of 07/18/2018   No Known Allergies     Medication List    STOP taking these medications   FLUoxetine 20 MG capsule Commonly known as:  PROZAC   gabapentin 100 MG capsule Commonly known as:  Neurontin     TAKE these medications   acetaminophen 325 MG tablet Commonly known as:  TYLENOL Take 2 tablets (650 mg total) by mouth every 6 (six) hours as needed for mild pain (or Fever >/= 101).   buprenorphine-naloxone 8-2 mg Subl SL tablet Commonly known as:  SUBOXONE  Place 1 tablet under the tongue daily.   ibuprofen 600 MG tablet Commonly known as:  ADVIL Take 1 tablet (600 mg total) by mouth every 6 (six) hours as needed.   lamoTRIgine 100 MG tablet Commonly known as:  LAMICTAL Take 100 mg by mouth 2 (two) times daily.   multivitamin with minerals Tabs tablet Take 1 tablet by mouth daily.   nicotine 21 mg/24hr  patch Commonly known as:  NICODERM CQ - dosed in mg/24 hours Place 1 patch (21 mg total) onto the skin daily. Start taking on:  July 19, 2018   sulfamethoxazole-trimethoprim 800-160 MG tablet Commonly known as:  BACTRIM DS Take 1 tablet by mouth 2 (two) times daily.      Follow-up Information    Tyson Alias, MD Follow up.   Specialty:  Internal Medicine Why:  you have an appointment on tuesday at 8;30 am.  Contact information: [address] [place] [address] [phone]          No Known Allergies  Consultations:  Dr Para March    Procedures/Studies: Ct Angio Chest Pe W Or Wo Contrast  Result Date: 07/17/2018 CLINICAL DATA:  Shortness of breath EXAM: CT ANGIOGRAPHY CHEST WITH CONTRAST TECHNIQUE: Multidetector CT imaging of the chest was performed using the standard protocol during bolus administration of intravenous contrast. Multiplanar CT image reconstructions and MIPs were obtained to evaluate the vascular anatomy. CONTRAST:  OMNIPAQUE IOHEXOL 350 MG/ML SOLN COMPARISON:  Chest radiograph July 15, 2018 FINDINGS: Cardiovascular: There is no demonstrable pulmonary embolus. There is no thoracic aortic aneurysm or dissection. The visualized great vessels appear normal. There is no pericardial effusion or pericardial thickening. Mediastinum/Nodes: Visualized thyroid appears unremarkable. There is residual thymic tissue which may be normal for age. There are subcentimeter axillary lymph nodes bilaterally. There is no adenopathy in the thoracic region by size criteria. No esophageal lesions are appreciable. Lungs/Pleura: There is no edema or consolidation. There is slight bibasilar atelectasis. No evident pleural effusions. On axial slice 73 series 6, there is a 2 mm nodular opacity in the lateral segment of the right middle lobe. On axial slice 61 series 6, there is a 5 x 4 mm nodular opacity in the anterior segment of the right upper lobe. On axial slice 79  series 6, there is a nodular opacity in the inferior lingula measuring 4 x 4 mm. On axial slice 84 series 6, there is a nodular opacity in the posterior aspect of the superior segment of the left lower lobe measuring 5 x 5 mm. Upper Abdomen: Visualized upper abdominal structures appear unremarkable. Musculoskeletal: There are no blastic or lytic bone lesions. No chest wall lesions are evident. Review of the MIP images confirms the above findings. IMPRESSION: 1. No demonstrable pulmonary embolus. No thoracic aortic aneurysm or dissection. 2. No edema or consolidation. Scattered nodular opacities, largest measuring 5 mm. None of the small nodular opacities are calcified. These small nodular opacities are almost certainly benign in this age group. There is no specific recommendation for follow-up imaging of these nodular opacities unless patient has known neoplasm or is felt to have high risk for neoplastic involvement. 3.  No appreciable thoracic adenopathy. Electronically Signed   By: Bretta Bang III M.D.   On: 07/17/2018 19:38   Dg Chest Port 1 View  Result Date: 07/15/2018 CLINICAL DATA:  Sepsis EXAM: PORTABLE CHEST 1 VIEW COMPARISON:  Chest x-ray dated 03/01/2015 FINDINGS: There is a rounded opacity at the left lung  base. There is mild volume overload. No pneumothorax. No large area of consolidation. The cardiac silhouette is normal with respect to size. There is no acute osseous abnormality. IMPRESSION: 1. Mild volume overload.  No large area of consolidation. 2. Oval density projecting over the left lower lobe may represent a developing infiltrate. A 4-6 week follow-up two-view chest x-ray is recommended to confirm resolution of this finding. Electronically Signed   By: Katherine Mantle M.D.   On: 07/15/2018 23:36      Subjective: She report mild headaches and dizziness. Orthostatic vital normal.  Denies chest pressure.  Feels well to go home She agrees to follow with suboxone  clinic.  Discharge Exam: Vitals:   07/18/18 0634 07/18/18 0832  BP: 110/80 116/80  Pulse: 71 72  Resp: 18   Temp: 98.4 F (36.9 C)   SpO2: 98%      General: Pt is alert, awake, not in acute distress Cardiovascular: RRR, S1/S2 +, no rubs, no gallops Respiratory: CTA bilaterally, no wheezing, no rhonchi Abdominal: Soft, NT, ND, bowel sounds + Extremities: no edema, no cyanosis    The results of significant diagnostics from this hospitalization (including imaging, microbiology, ancillary and laboratory) are listed below for reference.     Microbiology: Recent Results (from the past 240 hour(s))  Culture, blood (Routine x 2)     Status: Abnormal   Collection Time: 07/15/18  7:20 PM  Result Value Ref Range Status   Specimen Description BLOOD LEFT ARM  Final   Special Requests   Final    BOTTLES DRAWN AEROBIC AND ANAEROBIC Blood Culture results may not be optimal due to an excessive volume of blood received in culture bottles   Culture  Setup Time   Final    GRAM NEGATIVE RODS IN BOTH AEROBIC AND ANAEROBIC BOTTLES CRITICAL RESULT CALLED TO, READ BACK BY AND VERIFIED WITH: M. [facility] PharmD 15:40 07/16/18 ([email or web address]) Performed at [facility], [address], [place], [place] [address]    Culture SERRATIA MARCESCENS (A)  Final   Report Status 07/18/2018 FINAL  Final   Organism ID, Bacteria SERRATIA MARCESCENS  Final      Susceptibility   Serratia marcescens - MIC*    CEFAZOLIN >=64 RESISTANT Resistant     CEFEPIME <=1 SENSITIVE Sensitive     CEFTAZIDIME <=1 SENSITIVE Sensitive     CEFTRIAXONE <=1 SENSITIVE Sensitive     CIPROFLOXACIN <=0.25 SENSITIVE Sensitive     GENTAMICIN <=1 SENSITIVE Sensitive     TRIMETH/SULFA <=20 SENSITIVE Sensitive     * SERRATIA MARCESCENS  Blood Culture ID Panel (Reflexed)     Status: Abnormal   Collection Time: 07/15/18  7:20 PM  Result Value Ref Range Status   Enterococcus species NOT DETECTED NOT DETECTED Final   Listeria  monocytogenes NOT DETECTED NOT DETECTED Final   Staphylococcus species NOT DETECTED NOT DETECTED Final   Staphylococcus aureus (BCID) NOT DETECTED NOT DETECTED Final   Streptococcus species NOT DETECTED NOT DETECTED Final   Streptococcus agalactiae NOT DETECTED NOT DETECTED Final   Streptococcus pneumoniae NOT DETECTED NOT DETECTED Final   Streptococcus pyogenes NOT DETECTED NOT DETECTED Final   Acinetobacter baumannii NOT DETECTED NOT DETECTED Final   Enterobacteriaceae species DETECTED (A) NOT DETECTED Final    Comment: Enterobacteriaceae represent a large family of gram-negative bacteria, not a single organism. CRITICAL RESULT CALLED TO, READ BACK BY AND VERIFIED WITH: M. [facility] PharmD 15:40 07/16/18 ([email or web address])    Enterobacter cloacae complex NOT DETECTED NOT DETECTED  Final   Escherichia coli NOT DETECTED NOT DETECTED Final   Klebsiella oxytoca NOT DETECTED NOT DETECTED Final   Klebsiella pneumoniae NOT DETECTED NOT DETECTED Final   Proteus species NOT DETECTED NOT DETECTED Final   Serratia marcescens DETECTED (A) NOT DETECTED Final    Comment: CRITICAL RESULT CALLED TO, READ BACK BY AND VERIFIED WITH: M. Vivia Ewing PharmD 15:40 07/16/18 ([email or web address])    Carbapenem resistance NOT DETECTED NOT DETECTED Final   Haemophilus influenzae NOT DETECTED NOT DETECTED Final   Neisseria meningitidis NOT DETECTED NOT DETECTED Final   Pseudomonas aeruginosa NOT DETECTED NOT DETECTED Final   Candida albicans NOT DETECTED NOT DETECTED Final   Candida glabrata NOT DETECTED NOT DETECTED Final   Candida krusei NOT DETECTED NOT DETECTED Final   Candida parapsilosis NOT DETECTED NOT DETECTED Final   Candida tropicalis NOT DETECTED NOT DETECTED Final    Comment: Performed at [facility], [address], [place], [place] [address]  Culture, blood (Routine x 2)     Status: Abnormal   Collection Time: 07/15/18  7:37 PM  Result Value Ref Range Status   Specimen Description BLOOD RIGHT ARM  Final   Special  Requests   Final    BOTTLES DRAWN AEROBIC ONLY Blood Culture adequate volume   Culture  Setup Time   Final    GRAM NEGATIVE RODS AEROBIC BOTTLE ONLY CRITICAL VALUE NOTED.  VALUE IS CONSISTENT WITH PREVIOUSLY REPORTED AND CALLED VALUE.    Culture (A)  Final    SERRATIA MARCESCENS SUSCEPTIBILITIES PERFORMED ON PREVIOUS CULTURE WITHIN THE LAST 5 DAYS. Performed at [facility], [address], [place], [place] [address]    Report Status 07/18/2018 FINAL  Final  SARS Coronavirus 2 (CEPHEID - Performed in [facility] hospital lab), Hosp Order     Status: None   Collection Time: 07/15/18 10:47 PM  Result Value Ref Range Status   SARS Coronavirus 2 NEGATIVE NEGATIVE Final    Comment: (NOTE) If result is NEGATIVE SARS-CoV-2 target nucleic acids are NOT DETECTED. The SARS-CoV-2 RNA is generally detectable in upper and lower  respiratory specimens during the acute phase of infection. The lowest  concentration of SARS-CoV-2 viral copies this assay can detect is 250  copies / mL. A negative result does not preclude SARS-CoV-2 infection  and should not be used as the sole basis for treatment or other  patient management decisions.  A negative result may occur with  improper specimen collection / handling, submission of specimen other  than nasopharyngeal swab, presence of viral mutation(s) within the  areas targeted by this assay, and inadequate number of viral copies  (<250 copies / mL). A negative result must be combined with clinical  observations, patient history, and epidemiological information. If result is POSITIVE SARS-CoV-2 target nucleic acids are DETECTED. The SARS-CoV-2 RNA is generally detectable in upper and lower  respiratory specimens dur ing the acute phase of infection.  Positive  results are indicative of active infection with SARS-CoV-2.  Clinical  correlation with patient history and other diagnostic information is  necessary to determine patient infection status.   Positive results do  not rule out bacterial infection or co-infection with other viruses. If result is PRESUMPTIVE POSTIVE SARS-CoV-2 nucleic acids MAY BE PRESENT.   A presumptive positive result was obtained on the submitted specimen  and confirmed on repeat testing.  While 2019 novel coronavirus  (SARS-CoV-2) nucleic acids may be present in the submitted sample  additional confirmatory testing may be  necessary for epidemiological  and / or clinical management purposes  to differentiate between  SARS-CoV-2 and other Sarbecovirus currently known to infect humans.  If clinically indicated additional testing with an alternate test  methodology [phone]([id number]) is advised. The SARS-CoV-2 RNA is generally  detectable in upper and lower respiratory sp ecimens during the acute  phase of infection. The expected result is Negative. Fact Sheet for Patients:  BoilerBrush.com.cyhttps://www.fda.gov/media/136312/download Fact Sheet for Healthcare Providers: https://pope.com/https://www.fda.gov/media/136313/download This test is not yet approved or cleared by the [place] FDA and has been authorized for detection and/or diagnosis of SARS-CoV-2 by FDA under an Emergency Use Authorization (EUA).  This EUA will remain in effect (meaning this test can be used) for the duration of the COVID-19 declaration under Section 564(b)(1) of the Act, 21 U.S.C. section 360bbb-3(b)(1), unless the authorization is terminated or revoked sooner. Performed at [facility], [address] [place], [place] [address]   Culture, blood (routine x 2)     Status: None (Preliminary result)   Collection Time: 07/17/18 11:46 AM  Result Value Ref Range Status   Specimen Description BLOOD RIGHT ANTECUBITAL  Final   Special Requests   Final    BOTTLES DRAWN AEROBIC AND ANAEROBIC Blood Culture results may not be optimal due to an inadequate volume of blood received in culture bottles   Culture   Final    NO GROWTH < 24 HOURS Performed at [facility], [address] [place], [place] [address]    Report Status PENDING  Incomplete  Culture, blood (routine x 2)     Status: None (Preliminary result)   Collection Time: 07/17/18 11:57 AM  Result Value Ref Range Status   Specimen Description BLOOD RIGHT HAND  Final   Special Requests   Final    BOTTLES DRAWN AEROBIC AND ANAEROBIC Blood Culture results may not be optimal due to an inadequate volume of blood received in culture bottles   Culture   Final    NO GROWTH < 24 HOURS Performed at [facility]  Lab, [address] [place], [place] [address]    Report Status PENDING  Incomplete     Labs: BNP (last 3 results) No results for input(s): BNP in the last 8760 hours. Basic Metabolic Panel: Recent Labs  Lab 07/15/18 1940 07/16/18 0840 07/17/18 0718  NA 134* 139 137  K 3.2* 3.7 4.0  CL 99 109 107  CO2 24 23 21*  GLUCOSE 108* 103* 87  BUN 10 6 8   CREATININE 0.99 0.75 0.71  CALCIUM 9.3 8.4* 8.4*  MG  --   --  1.8  PHOS  --   --  3.3   Liver Function Tests: Recent Labs  Lab 07/15/18 1940 07/16/18 0840 07/17/18 0718  AST 112* 56*  --   ALT 239* 152*  --   ALKPHOS 76 51  --   BILITOT 1.5* 0.7  --   PROT 6.7 5.1*  --   ALBUMIN 3.5 2.5* 2.6*   No results for input(s): LIPASE, AMYLASE in the last 168 hours. No results for input(s): AMMONIA in the last 168 hours. CBC: Recent Labs  Lab 07/15/18 1940 07/16/18 0840 07/17/18 0718  WBC 11.1* 9.2 5.4  NEUTROABS 10.4*  --  3.0  HGB 14.2 11.7* 11.7*  HCT 42.7 35.7* 36.0  MCV 96.4 97.0 98.4  PLT 194 152 150   Cardiac Enzymes: Recent Labs  Lab 07/17/18 1447 07/17/18 1910 07/18/18 0034  TROPONINI <0.03 <0.03 <0.03  BNP: Invalid input(s): POCBNP CBG: No results for input(s): GLUCAP in the last 168 hours. D-Dimer Recent Labs    07/17/18 1447  DDIMER 0.68*   Hgb A1c No results for input(s): HGBA1C in the last 72 hours. Lipid Profile No results for input(s): CHOL, HDL, LDLCALC, TRIG, CHOLHDL, LDLDIRECT in the  last 72 hours. Thyroid function studies No results for input(s): TSH, T4TOTAL, T3FREE, THYROIDAB in the last 72 hours.  Invalid input(s): FREET3 Anemia work up No results for input(s): VITAMINB12, FOLATE, FERRITIN, TIBC, IRON, RETICCTPCT in the last 72 hours. Urinalysis    Component Value Date/Time   COLORURINE YELLOW 07/15/2018 0025   APPEARANCEUR HAZY (A) 07/15/2018 0025   APPEARANCEUR Clear 05/29/2014 0306   LABSPEC 1.006 07/15/2018 0025   LABSPEC 1.008 05/29/2014 0306   PHURINE 6.0 07/15/2018 0025   GLUCOSEU 50 (A) 07/15/2018 0025   GLUCOSEU Negative 05/29/2014 0306   HGBUR NEGATIVE 07/15/2018 0025   BILIRUBINUR NEGATIVE 07/15/2018 0025   BILIRUBINUR Negative 05/29/2014 0306   KETONESUR NEGATIVE 07/15/2018 0025   PROTEINUR NEGATIVE 07/15/2018 0025   UROBILINOGEN 0.2 02/20/2012 2243   NITRITE NEGATIVE 07/15/2018 0025   LEUKOCYTESUR SMALL (A) 07/15/2018 0025   LEUKOCYTESUR Negative 05/29/2014 0306   Sepsis Labs Invalid input(s): PROCALCITONIN,  WBC,  LACTICIDVEN Microbiology Recent Results (from the past 240 hour(s))  Culture, blood (Routine x 2)     Status: Abnormal   Collection Time: 07/15/18  7:20 PM  Result Value Ref Range Status   Specimen Description BLOOD LEFT ARM  Final   Special Requests   Final    BOTTLES DRAWN AEROBIC AND ANAEROBIC Blood Culture results may not be optimal due to an excessive volume of blood received in culture bottles   Culture  Setup Time   Final    GRAM NEGATIVE RODS IN BOTH AEROBIC AND ANAEROBIC BOTTLES CRITICAL RESULT CALLED TO, READ BACK BY AND VERIFIED WITH: M. [facility] PharmD 15:40 07/16/18 ([email or web address]) Performed at [facility], [address] [place], [place] [address]    Culture SERRATIA MARCESCENS (A)  Final   Report Status 07/18/2018 FINAL  Final   Organism ID, Bacteria SERRATIA MARCESCENS  Final      Susceptibility   Serratia marcescens - MIC*    CEFAZOLIN >=64 RESISTANT Resistant     CEFEPIME <=1 SENSITIVE Sensitive      CEFTAZIDIME <=1 SENSITIVE Sensitive     CEFTRIAXONE <=1 SENSITIVE Sensitive     CIPROFLOXACIN <=0.25 SENSITIVE Sensitive     GENTAMICIN <=1 SENSITIVE Sensitive     TRIMETH/SULFA <=20 SENSITIVE Sensitive     * SERRATIA MARCESCENS  Blood Culture ID Panel (Reflexed)     Status: Abnormal   Collection Time: 07/15/18  7:20 PM  Result Value Ref Range Status   Enterococcus species NOT DETECTED NOT DETECTED Final   Listeria monocytogenes NOT DETECTED NOT DETECTED Final   Staphylococcus species NOT DETECTED NOT DETECTED Final   Staphylococcus aureus (BCID) NOT DETECTED NOT DETECTED Final   Streptococcus species NOT DETECTED NOT DETECTED Final   Streptococcus agalactiae NOT DETECTED NOT DETECTED Final   Streptococcus pneumoniae NOT DETECTED NOT DETECTED Final   Streptococcus pyogenes NOT DETECTED NOT DETECTED Final   Acinetobacter baumannii NOT DETECTED NOT DETECTED Final   Enterobacteriaceae species DETECTED (A) NOT DETECTED Final    Comment: Enterobacteriaceae represent a large family of gram-negative bacteria, not a single organism. CRITICAL RESULT CALLED TO, READ BACK BY AND VERIFIED WITH: M. [facility] PharmD 15:40 07/16/18 ([email or web address])    Enterobacter cloacae  complex NOT DETECTED NOT DETECTED Final   Escherichia coli NOT DETECTED NOT DETECTED Final   Klebsiella oxytoca NOT DETECTED NOT DETECTED Final   Klebsiella pneumoniae NOT DETECTED NOT DETECTED Final   Proteus species NOT DETECTED NOT DETECTED Final   Serratia marcescens DETECTED (A) NOT DETECTED Final    Comment: CRITICAL RESULT CALLED TO, READ BACK BY AND VERIFIED WITH: M. Vivia Ewing PharmD 15:40 07/16/18 ([email or web address])    Carbapenem resistance NOT DETECTED NOT DETECTED Final   Haemophilus influenzae NOT DETECTED NOT DETECTED Final   Neisseria meningitidis NOT DETECTED NOT DETECTED Final   Pseudomonas aeruginosa NOT DETECTED NOT DETECTED Final   Candida albicans NOT DETECTED NOT DETECTED Final   Candida glabrata NOT DETECTED NOT DETECTED Final    Candida krusei NOT DETECTED NOT DETECTED Final   Candida parapsilosis NOT DETECTED NOT DETECTED Final   Candida tropicalis NOT DETECTED NOT DETECTED Final    Comment: Performed at [facility], [address], [place], [place] [address]  Culture, blood (Routine x 2)     Status: Abnormal   Collection Time: 07/15/18  7:37 PM  Result Value Ref Range Status   Specimen Description BLOOD RIGHT ARM  Final   Special Requests   Final    BOTTLES DRAWN AEROBIC ONLY Blood Culture adequate volume   Culture  Setup Time   Final    GRAM NEGATIVE RODS AEROBIC BOTTLE ONLY CRITICAL VALUE NOTED.  VALUE IS CONSISTENT WITH PREVIOUSLY REPORTED AND CALLED VALUE.    Culture (A)  Final    SERRATIA MARCESCENS SUSCEPTIBILITIES PERFORMED ON PREVIOUS CULTURE WITHIN THE LAST 5 DAYS. Performed at [facility], [address], [place], [place] [address]    Report Status 07/18/2018 FINAL  Final  SARS Coronavirus 2 (CEPHEID - Performed in [facility] lab), Hosp Order     Status: None   Collection Time: 07/15/18 10:47 PM  Result Value Ref Range Status   SARS Coronavirus 2 NEGATIVE NEGATIVE Final    Comment: (NOTE) If result is NEGATIVE SARS-CoV-2 target nucleic acids are NOT DETECTED. The SARS-CoV-2 RNA is generally detectable in upper and lower  respiratory specimens during the acute phase of infection. The lowest  concentration of SARS-CoV-2 viral copies this assay can detect is 250  copies / mL. A negative result does not preclude SARS-CoV-2 infection  and should not be used as the sole basis for treatment or other  patient management decisions.  A negative result may occur with  improper specimen collection / handling, submission of specimen other  than nasopharyngeal swab, presence of viral mutation(s) within the  areas targeted by this assay, and inadequate number of viral copies  (<250 copies / mL). A negative result must be combined with clinical  observations, patient history, and  epidemiological information. If result is POSITIVE SARS-CoV-2 target nucleic acids are DETECTED. The SARS-CoV-2 RNA is generally detectable in upper and lower  respiratory specimens dur ing the acute phase of infection.  Positive  results are indicative of active infection with SARS-CoV-2.  Clinical  correlation with patient history and other diagnostic information is  necessary to determine patient infection status.  Positive results do  not rule out bacterial infection or co-infection with other viruses. If result is PRESUMPTIVE POSTIVE SARS-CoV-2 nucleic acids MAY BE PRESENT.   A presumptive positive result was obtained on the submitted specimen  and confirmed on repeat testing.  While 2019 novel coronavirus  (SARS-CoV-2) nucleic acids may be present in the submitted sample  additional confirmatory testing may be necessary for epidemiological  and / or clinical management purposes  to differentiate between  SARS-CoV-2 and other Sarbecovirus currently known to infect humans.  If clinically indicated additional testing with an alternate test  methodology [phone]) is advised. The SARS-CoV-2 RNA is generally  detectable in upper and lower respiratory sp ecimens during the acute  phase of infection. The expected result is Negative. Fact Sheet for Patients:  BoilerBrush.com.cy Fact Sheet for Healthcare Providers: https://pope.com/ This test is not yet approved or cleared by the [place] FDA and has been authorized for detection and/or diagnosis of SARS-CoV-2 by FDA under an Emergency Use Authorization (EUA).  This EUA will remain in effect (meaning this test can be used) for the duration of the COVID-19 declaration under Section 564(b)(1) of the Act, 21 U.S.C. section 360bbb-3(b)(1), unless the authorization is terminated or revoked sooner. Performed at [facility], [address] [place], [place] [address]   Culture,  blood (routine x 2)     Status: None (Preliminary result)   Collection Time: 07/17/18 11:46 AM  Result Value Ref Range Status   Specimen Description BLOOD RIGHT ANTECUBITAL  Final   Special Requests   Final    BOTTLES DRAWN AEROBIC AND ANAEROBIC Blood Culture results may not be optimal due to an inadequate volume of blood received in culture bottles   Culture   Final    NO GROWTH < 24 HOURS Performed at [facility], [address], [place], [place] [address]    Report Status PENDING  Incomplete  Culture, blood (routine x 2)     Status: None (Preliminary result)   Collection Time: 07/17/18 11:57 AM  Result Value Ref Range Status   Specimen Description BLOOD RIGHT HAND  Final   Special Requests   Final    BOTTLES DRAWN AEROBIC AND ANAEROBIC Blood Culture results may not be optimal due to an inadequate volume of blood received in culture bottles   Culture   Final    NO GROWTH < 24 HOURS Performed at [facility], [address], [place], [place] [address]    Report Status PENDING  Incomplete     Time coordinating discharge: 40 minutes  SIGNED:   Alba Cory, MD  [facility]

## 2018-07-18 NOTE — Progress Notes (Signed)
Patient was discharged home. discharge packet and medication education with teach back given.  Wound care education given, patient verbalized understanding. Patients belongings with pt: clothes, cell phone, charger and shoes. Medication delivered by [facility] pharmacy. Patient walked out of the floor with belongings in hand.

## 2018-07-18 NOTE — TOC Transition Note (Signed)
Transition of Care [facility]) - CM/SW Discharge Note   Patient Details  Name: [name] MRN: [id number] Date of Birth: December 30, 1990  [facility]) CM/SW Contact:  Reola Mosher Phone Number: [phone] 07/18/2018, 10:06 AM   Clinical Narrative:    Patient is for discharge home today; Follow up apt is with [facility] for July 23, 2018 at 8:30 am; [facility] pharmacy to fill prescriptions prior to discharging home.   Final next level of care: Home/Self Care Barriers to Discharge: No Barriers Identified   Patient Goals and CMS Choice Patient states their goals for this hospitalization and ongoing recovery are:: to get out of here CMS Medicare.gov Compare Post Acute Care list provided to:: Patient Choice offered to / list presented to : NA  Discharge Placement                       Discharge Plan and Services In-house Referral: NA, Financial Counselor Discharge Planning Services: CM Consult, [facility] Post Acute Care Choice: NA          DME Arranged: N/A DME Agency: NA         HH Agency: NA        Social Determinants of Health (SDOH) Interventions     Readmission Risk Interventions No flowsheet data found.

## 2018-07-18 NOTE — Progress Notes (Signed)
Pt sleeping in bed and does not wish to have dressing change during this time.  RN will report information to daytime nurse.

## 2018-07-18 NOTE — Discharge Instructions (Signed)
Pack wound with 1/4" iodoform, cover with dry gauze.

## 2018-07-20 LAB — HEPATITIS C GENOTYPE: Hepatitis C Genotype: 3

## 2018-07-20 LAB — HCV RNA QUANT RFLX ULTRA OR GENOTYP
HCV RNA Qnt(log copy/mL): 4.81 log10 IU/mL
HepC Qn: 64500 IU/mL

## 2018-07-22 LAB — CULTURE, BLOOD (ROUTINE X 2)
Culture: NO GROWTH
Culture: NO GROWTH

## 2018-07-25 ENCOUNTER — Ambulatory Visit: Payer: Self-pay | Attending: Primary Care | Admitting: Primary Care

## 2018-07-25 ENCOUNTER — Other Ambulatory Visit: Payer: Self-pay

## 2018-08-01 ENCOUNTER — Inpatient Hospital Stay: Payer: Self-pay | Admitting: Primary Care

## 2018-11-05 ENCOUNTER — Other Ambulatory Visit: Payer: Self-pay

## 2018-11-05 ENCOUNTER — Encounter: Payer: Self-pay | Admitting: Emergency Medicine

## 2018-11-05 DIAGNOSIS — I10 Essential (primary) hypertension: Secondary | ICD-10-CM | POA: Insufficient documentation

## 2018-11-05 DIAGNOSIS — Z79899 Other long term (current) drug therapy: Secondary | ICD-10-CM | POA: Insufficient documentation

## 2018-11-05 DIAGNOSIS — F17210 Nicotine dependence, cigarettes, uncomplicated: Secondary | ICD-10-CM | POA: Insufficient documentation

## 2018-11-05 DIAGNOSIS — H60502 Unspecified acute noninfective otitis externa, left ear: Secondary | ICD-10-CM | POA: Insufficient documentation

## 2018-11-05 LAB — GLUCOSE, CAPILLARY: Glucose-Capillary: 70 mg/dL (ref 70–99)
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
## 2018-11-06 ENCOUNTER — Emergency Department
Admission: EM | Admit: 2018-11-06 | Discharge: 2018-11-06 | Disposition: A | Discharge status: Home / Self Care | Payer: Self-pay | Attending: Emergency Medicine | Admitting: Emergency Medicine

## 2018-11-06 DIAGNOSIS — H60502 Unspecified acute noninfective otitis externa, left ear: Secondary | ICD-10-CM

## 2018-11-06 MED ORDER — KETOROLAC TROMETHAMINE 10 MG PO TABS
10.0000 mg | ORAL_TABLET | Freq: Once | ORAL | Status: AC
  Administered 2018-11-06: 10 mg via ORAL
  Filled 2018-11-06: qty 1

## 2018-11-06 MED ORDER — AMOXICILLIN-POT CLAVULANATE 875-125 MG PO TABS
1.0000 | ORAL_TABLET | Freq: Once | ORAL | Status: AC
  Administered 2018-11-06: 1 via ORAL
  Filled 2018-11-06: qty 1

## 2018-11-06 MED ORDER — CIPROFLOXACIN-DEXAMETHASONE 0.3-0.1 % OT SUSP: 4.0000 [drp] | Freq: Two times a day (BID) | OTIC | 0 refills | Status: DC

## 2018-11-06 MED ORDER — CIPROFLOXACIN-DEXAMETHASONE 0.3-0.1 % OT SUSP
4.0000 [drp] | Freq: Once | OTIC | Status: AC
  Administered 2018-11-06: 4 [drp] via OTIC
  Filled 2018-11-06: qty 7.5

## 2018-11-06 MED ORDER — AMOXICILLIN-POT CLAVULANATE 875-125 MG PO TABS: 1.0000 | ORAL_TABLET | Freq: Two times a day (BID) | ORAL | 0 refills | Status: AC

## 2018-11-06 NOTE — ED Notes (Signed)
Patient updated on wait time. Patient in no acute distress at this time. Awaiting room for MD eval.

## 2018-11-06 NOTE — ED Notes (Signed)
Patient states she hasn't eaten and feels like blood sugar may be starting to get low. Denies dizziness. Patient's blood glucose checked. Within normal range, but given crackers and orange juice.

## 2018-11-06 NOTE — ED Provider Notes (Signed)
[facility] Emergency Department Provider Note    First MD Initiated Contact with Patient 11/06/18 0037     (approximate)  I have reviewed the triage vital signs and the nursing notes.   HISTORY  Chief Complaint Otalgia    HPI [name] is a 28 y.o. female with below list of previous medical conditions presents emergency department secondary to left ear pain with yellow-green drainage x3 weeks.  Patient denies any fever afebrile on presentation.       Past Medical History:  Diagnosis Date  . ADD (attention deficit disorder)   . Anxiety   . Bipolar 1 disorder (HCC)   . Depression   . HA (headache)   . Hepatitis   . Hypertension   . PTSD (post-traumatic stress disorder)   . Status post tubal ligation 11/21/2015   Postpartum    Patient Active Problem List   Diagnosis Date Noted  . Sepsis (HCC) 07/15/2018  . Substance use disorder 07/15/2018  . Abscess of right axilla 07/15/2018  . Hepatitis C antibody test positive 07/15/2018  . MDD (major depressive disorder), severe (HCC) 05/31/2018  . 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 Status post tubal ligation 11/21/2015  . Postpartum care following vaginal delivery 11/20/2015  . Post term pregnancy at [redacted] weeks gestation 11/19/2015  . PTSD (post-traumatic stress disorder)   . Depression   . Bipolar 1 disorder (HCC)   . Major depression 02/16/2013  . Alcohol abuse 02/16/2013  . Anxiety state, unspecified 02/16/2013    Past Surgical History:  Procedure Laterality Date  . MOUTH SURGERY    . TUBAL LIGATION Bilateral 11/21/2015   Procedure: POST PARTUM TUBAL LIGATION;  Surgeon: Conard Novak, MD;  Location: [facility] ORS;  Service: Gynecology;  Laterality: Bilateral;    Prior to Admission medications   Medication Sig Start Date End Date Taking? Authorizing Provider  acetaminophen (TYLENOL) 325 MG tablet Take 2 tablets (650 mg total) by  mouth every 6 (six) hours as needed for mild pain (or Fever >/= 101). 07/18/18   Regalado, Belkys A, MD  buprenorphine-naloxone (SUBOXONE) 8-2 mg SUBL SL tablet Place 1 tablet under the tongue daily. 07/18/18   Tyson Alias, MD  ibuprofen (ADVIL,MOTRIN) 600 MG tablet Take 1 tablet (600 mg total) by mouth every 6 (six) hours as needed. Patient not taking: Reported on 05/30/2018 06/11/17   Elpidio Anis, PA-C  lamoTRIgine (LAMICTAL) 100 MG tablet Take 100 mg by mouth 2 (two) times daily.    [provider]  Multiple Vitamin (MULTIVITAMIN WITH MINERALS) TABS tablet Take 1 tablet by mouth daily. Patient not taking: Reported on 07/15/2018 06/03/18   Malvin Johns, MD  nicotine (NICODERM CQ - DOSED IN MG/24 HOURS) 21 mg/24hr patch Place 1 patch (21 mg total) onto the skin daily. 07/19/18   Regalado, Belkys A, MD  sulfamethoxazole-trimethoprim (BACTRIM DS) 800-160 MG tablet Take 1 tablet by mouth 2 (two) times daily. 07/18/18   Regalado, Prentiss Bells, MD    Allergies Patient has no known allergies.  Family History  Problem Relation Age of Onset  . Alcohol abuse Mother   . Depression Mother   . Heart attack Mother   . Alcohol abuse Father   . Diabetes Father     Social History Social History   Tobacco Use  . Smoking status: Current Every Day Smoker    Packs/day: 0.50    Types: Cigarettes  . Smokeless tobacco: Never Used  Substance  Use Topics  . Alcohol use: Yes    Alcohol/week: 0.0 standard drinks    Comment: Patient endorses drinking occassionally. Amount unspecified   . Drug use: Yes    Types: Methamphetamines, Heroin, Amphetamines, Marijuana, Cocaine, IV    Comment: heroin    Review of Systems Constitutional: No fever/chills Eyes: No visual changes. ENT: No sore throat.  Positive for left ear pain swelling and drainage Cardiovascular: Denies chest pain. Respiratory: Denies shortness of breath. Gastrointestinal: No abdominal pain.  No nausea, no vomiting.  No diarrhea.  No  constipation. Genitourinary: Negative for dysuria. Musculoskeletal: Negative for neck pain.  Negative for back pain. Integumentary: Negative for rash. Neurological: Negative for headaches, focal weakness or numbness.  ____________________________________________   PHYSICAL EXAM:  VITAL SIGNS: ED Triage Vitals  Enc Vitals Group     BP 11/05/18 2259 102/64     Pulse Rate 11/05/18 2259 77     Resp 11/05/18 2259 20     Temp 11/05/18 2259 97.9 F (36.6 C)     Temp Source 11/05/18 2259 Oral     SpO2 11/05/18 2259 100 %     Weight 11/05/18 2300 58.1 kg (128 lb)     Height 11/05/18 2300 1.6 m (5\' 3" )     Head Circumference --      Peak Flow --      Pain Score 11/05/18 2300 8     Pain Loc --      Pain Edu? --      Excl. in [place]? --     Constitutional: Alert and oriented.  Eyes: Conjunctivae are normal.  Head: Atraumatic. Ears: Swelling to the left external auditory canal noted with yellow-green drainage Mouth/Throat: Mucous membranes are moist. Neck: No stridor.  No meningeal signs.   Cardiovascular: Normal rate, regular rhythm. Good peripheral circulation. Grossly normal heart sounds Neurologic:  Normal speech and language. No gross focal neurologic deficits are appreciated.  Skin:  Skin is warm, dry and intact. Psychiatric: Mood and affect are normal. Speech and behavior are normal.     Procedures   ____________________________________________   INITIAL IMPRESSION / MDM / ASSESSMENT AND PLAN / ED COURSE  As part of my medical decision making, I reviewed the following data within the electronic MEDICAL RECORD NUMBER 28 year old female presented with above-stated history and physical exam consistent with left otitis externa.  Ciprodex administered patient given Augmentin will be prescribed the same for home.     ____________________________________________  FINAL CLINICAL IMPRESSION(S) / ED DIAGNOSES  Final diagnoses:  Acute otitis externa of left ear, unspecified type      MEDICATIONS GIVEN DURING THIS VISIT:  Medications  ciprofloxacin-dexamethasone (CIPRODEX) 0.3-0.1 % OTIC (EAR) suspension 4 drop (has no administration in time range)  amoxicillin-clavulanate (AUGMENTIN) 875-125 MG per tablet 1 tablet (has no administration in time range)     ED Discharge Orders    None      *Please note:  KALAYLA SHADDEN was evaluated in Emergency Department on 11/06/2018 for the symptoms described in the history of present illness. She was evaluated in the context of the global COVID-19 pandemic, which necessitated consideration that the patient might be at risk for infection with the SARS-CoV-2 virus that causes COVID-19. Institutional protocols and algorithms that pertain to the evaluation of patients at risk for COVID-19 are in a state of rapid change based on information released by regulatory bodies including the CDC and federal and state organizations. These policies and algorithms were followed during the patient's care  in the ED.  Some ED evaluations and interventions may be delayed as a result of limited staffing during the pandemic.*  Note:  This document was prepared using Dragon voice recognition software and may include unintentional dictation errors.   Darci Current, MD 11/06/18 [phone]

## 2019-04-13 ENCOUNTER — Encounter (HOSPITAL_BASED_OUTPATIENT_CLINIC_OR_DEPARTMENT_OTHER): Payer: Self-pay

## 2019-04-13 ENCOUNTER — Other Ambulatory Visit: Payer: Self-pay

## 2019-04-13 ENCOUNTER — Emergency Department (HOSPITAL_BASED_OUTPATIENT_CLINIC_OR_DEPARTMENT_OTHER)
Admission: EM | Admit: 2019-04-13 | Discharge: 2019-04-13 | Disposition: A | Discharge status: Home / Self Care | Payer: Medicaid Other | Attending: Emergency Medicine | Admitting: Emergency Medicine

## 2019-04-13 DIAGNOSIS — R1110 Vomiting, unspecified: Secondary | ICD-10-CM | POA: Insufficient documentation

## 2019-04-13 DIAGNOSIS — Z5321 Procedure and treatment not carried out due to patient leaving prior to being seen by health care provider: Secondary | ICD-10-CM | POA: Insufficient documentation

## 2019-04-13 DIAGNOSIS — J029 Acute pharyngitis, unspecified: Secondary | ICD-10-CM | POA: Insufficient documentation

## 2019-04-13 NOTE — ED Triage Notes (Signed)
Pt arrives with c/o Coviid symptoms states that she has been having symptoms for 3 days was around someone who had covid about a week ago. C/O sore throat, vomiting, runny nose.

## 2019-04-13 NOTE — ED Notes (Signed)
Called x3 no response

## 2020-07-15 IMAGING — CT CT ANGIOGRAPHY CHEST
2 of 7 series · 17 of 46 positions shown · IV contrast (omnipaque)
Comparison: Chest radiograph July 15, 2018

CLINICAL DATA: Shortness of breath

EXAM:
CT ANGIOGRAPHY CHEST WITH CONTRAST
TECHNIQUE: Multidetector CT imaging of the chest was performed using the
standard protocol during bolus administration of intravenous
contrast. Multiplanar CT image reconstructions and MIPs were
obtained to evaluate the vascular anatomy.
CONTRAST:  100mL OMNIPAQUE IOHEXOL 350 MG/ML SOLN

[Series 7: thins · axial · 0.56mm/px · z∈[-56,+147]mm · 14 of 328 slices shown]
[im 19/328  lung]
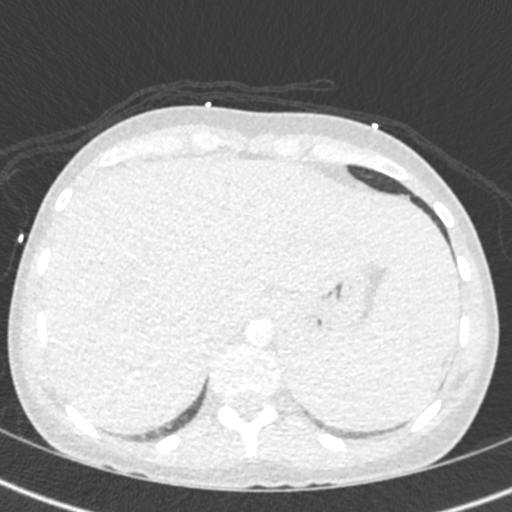
[im 37/328  soft-tissue]
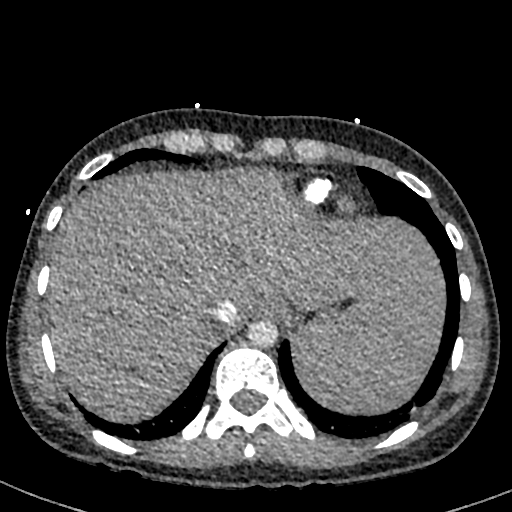
[im 73/328  lung]
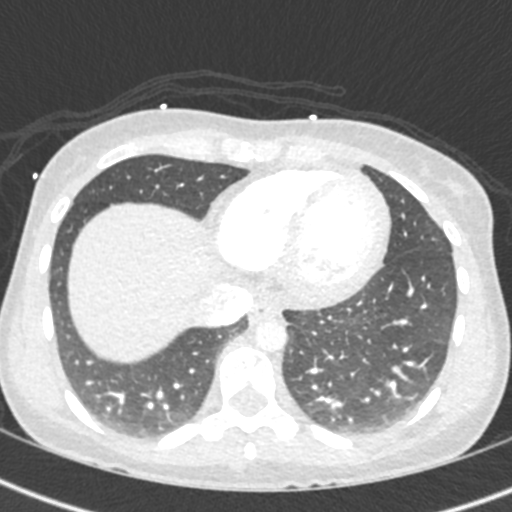
[im 91/328  soft-tissue]
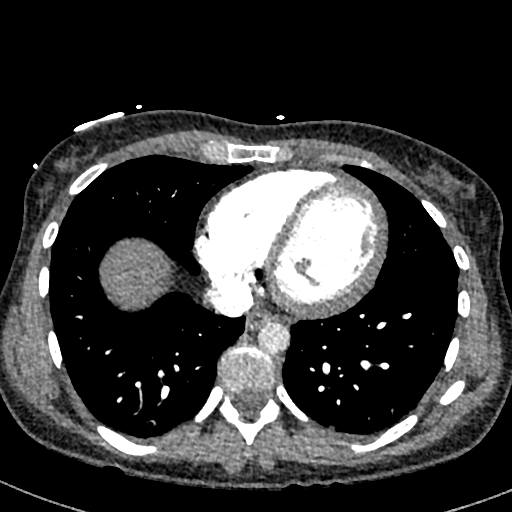
[im 110/328  lung]
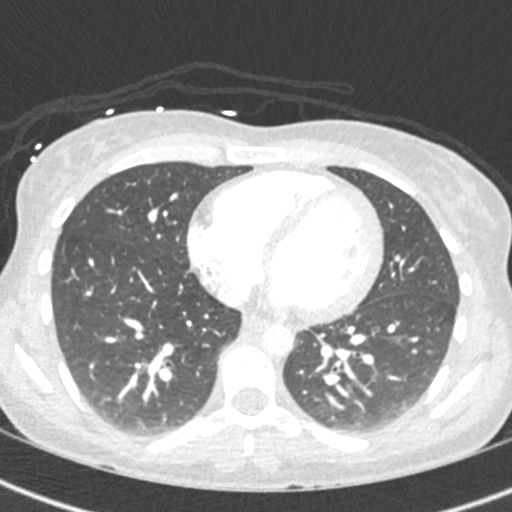
[im 128/328  soft-tissue]
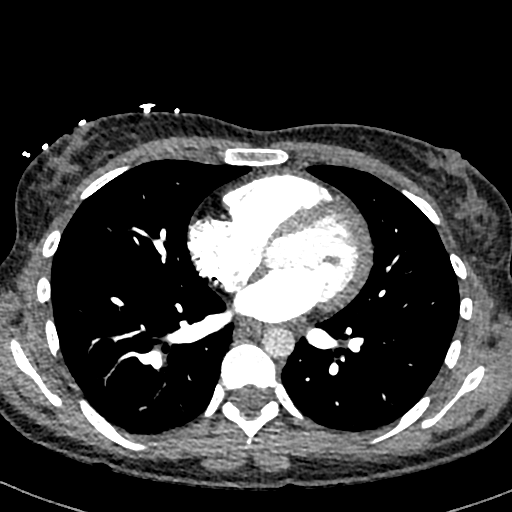
[im 146/328  lung]
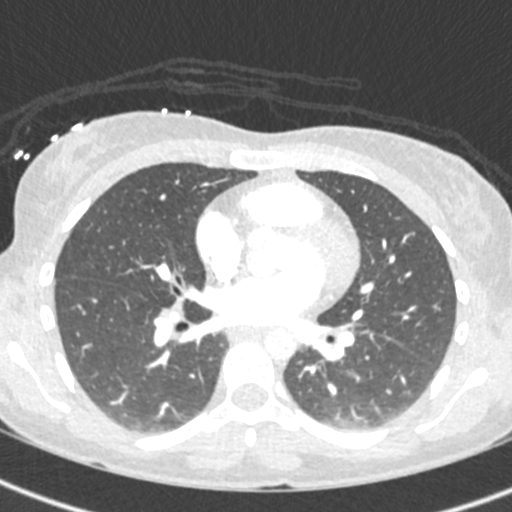
[im 182/328  soft-tissue]
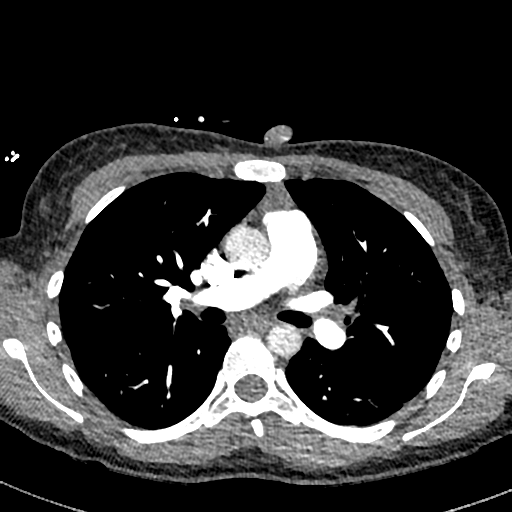
[im 200/328  lung]
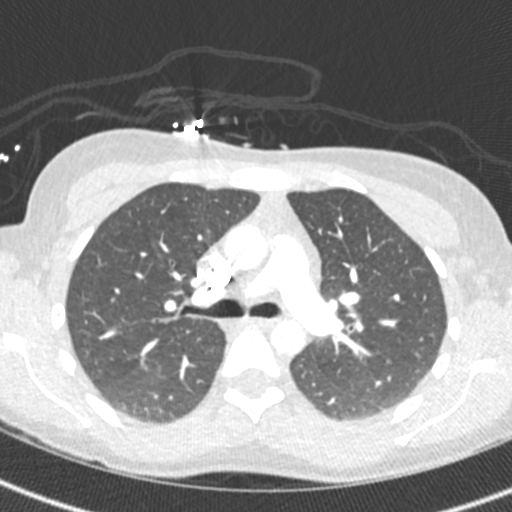
[im 219/328  soft-tissue]
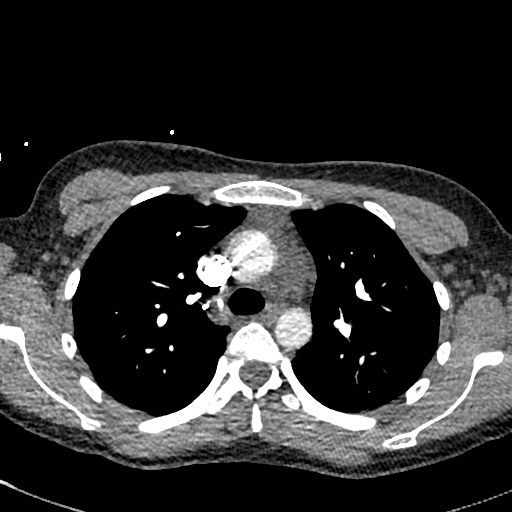
[im 237/328  lung]
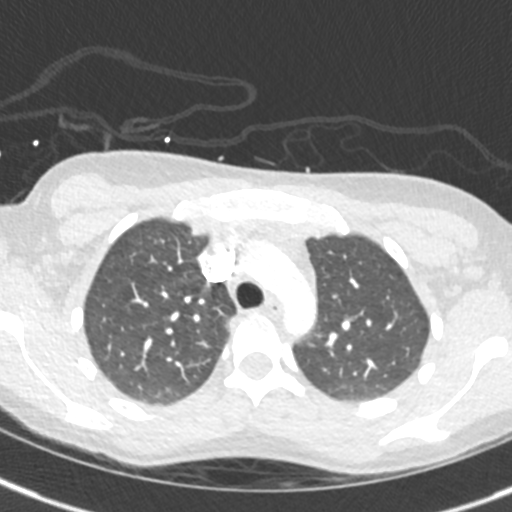
[im 255/328  soft-tissue]
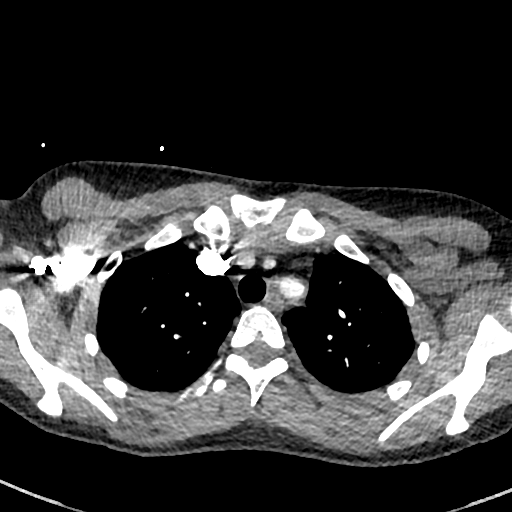
[im 291/328  lung]
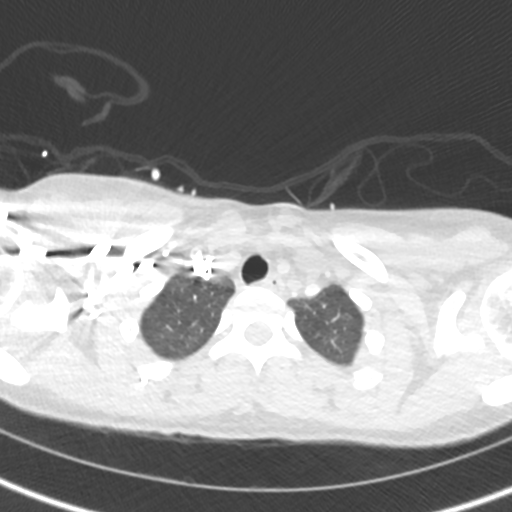
[im 309/328  soft-tissue]
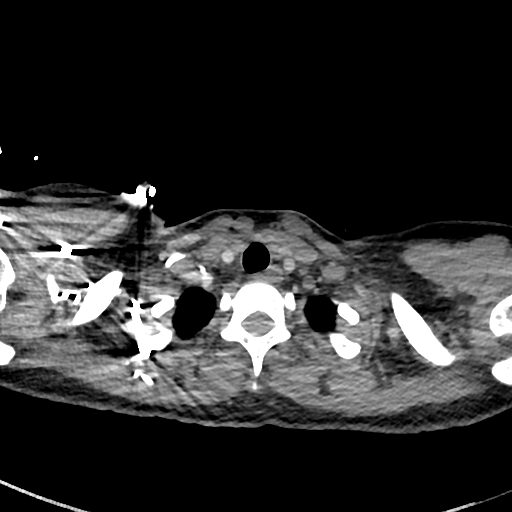

[Series 8: cor · coronal · 0.46mm/px · 3 of 115 slices shown]
[im 29/115  soft-tissue]
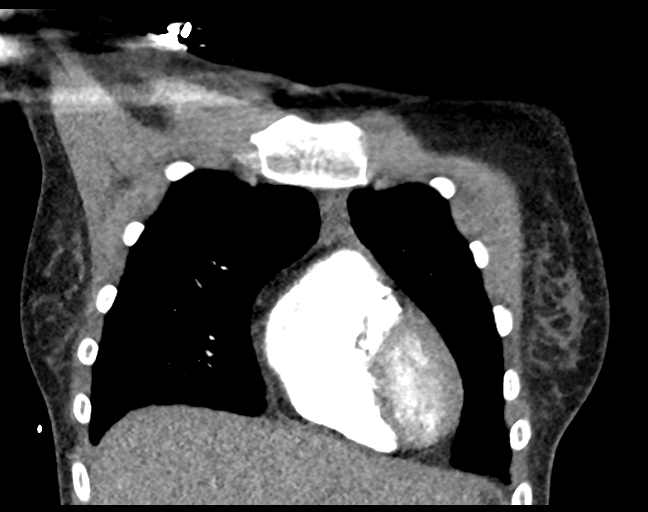
[im 58/115  soft-tissue]
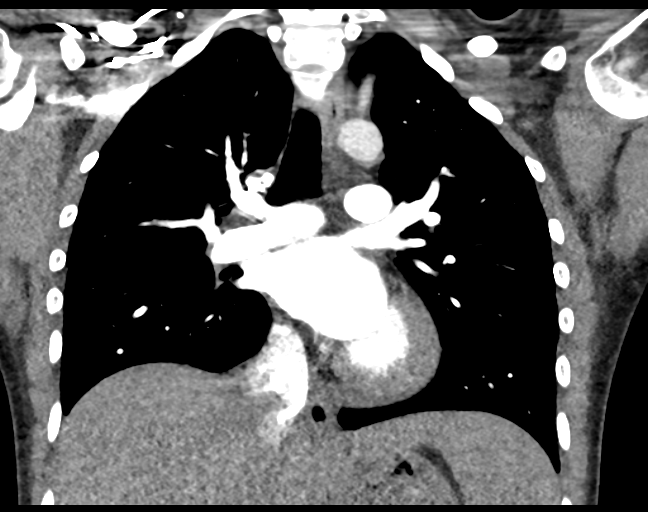
[im 86/115  soft-tissue]
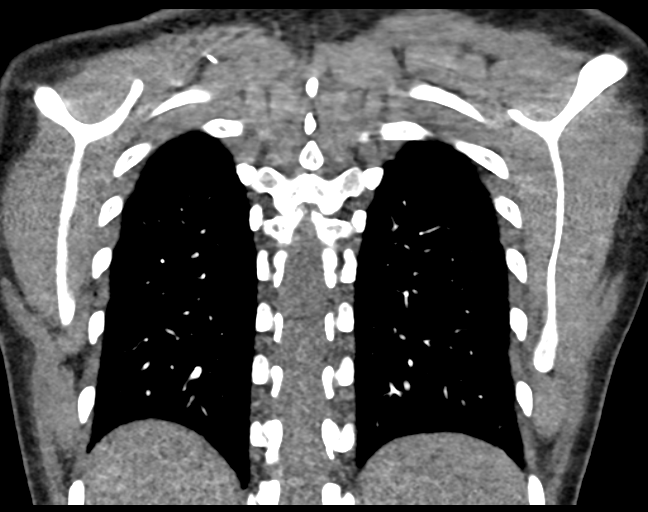

[17 of 46 positions shown; findings below may reference images not displayed]

FINDINGS: Cardiovascular: There is no demonstrable pulmonary embolus. There is
no thoracic aortic aneurysm or dissection. The visualized great
vessels appear normal. There is no pericardial effusion or
pericardial thickening.

Mediastinum/Nodes: Visualized thyroid appears unremarkable. There is
residual thymic tissue which may be normal for age. There are
subcentimeter axillary lymph nodes bilaterally. There is no
adenopathy in the thoracic region by size criteria. No esophageal
lesions are appreciable.

Lungs/Pleura: There is no edema or consolidation. There is slight
bibasilar atelectasis. No evident pleural effusions. On axial slice
73 series 6, there is a 2 mm nodular opacity in the lateral segment
of the right middle lobe. On axial slice 61 series 6, there is a 5 x
4 mm nodular opacity in the anterior segment of the right upper
lobe. On axial slice 79 series 6, there is a nodular opacity in the
inferior lingula measuring 4 x 4 mm. On axial slice 84 series 6,
there is a nodular opacity in the posterior aspect of the superior
segment of the left lower lobe measuring 5 x 5 mm.

Upper Abdomen: Visualized upper abdominal structures appear
unremarkable.

Musculoskeletal: There are no blastic or lytic bone lesions. No
chest wall lesions are evident.

Review of the MIP images confirms the above findings.
IMPRESSION: 1. No demonstrable pulmonary embolus. No thoracic aortic aneurysm or
dissection.

2. No edema or consolidation. Scattered nodular opacities, largest
measuring 5 mm. None of the small nodular opacities are calcified.
These small nodular opacities are almost certainly benign in this
age group. There is no specific recommendation for follow-up imaging
of these nodular opacities unless patient has known neoplasm or is
felt to have high risk for neoplastic involvement.

3.  No appreciable thoracic adenopathy.

## 2020-08-26 ENCOUNTER — Encounter (HOSPITAL_COMMUNITY): Payer: Self-pay

## 2020-08-26 ENCOUNTER — Other Ambulatory Visit: Payer: Self-pay

## 2020-08-26 ENCOUNTER — Emergency Department (HOSPITAL_COMMUNITY)
Admission: EM | Admit: 2020-08-26 | Discharge: 2020-08-26 | Disposition: A | Discharge status: Home / Self Care | Payer: Self-pay | Attending: Emergency Medicine | Admitting: Emergency Medicine

## 2020-08-26 ENCOUNTER — Emergency Department (HOSPITAL_COMMUNITY): Payer: Self-pay

## 2020-08-26 DIAGNOSIS — S0181XA Laceration without foreign body of other part of head, initial encounter: Secondary | ICD-10-CM | POA: Insufficient documentation

## 2020-08-26 DIAGNOSIS — F17210 Nicotine dependence, cigarettes, uncomplicated: Secondary | ICD-10-CM | POA: Insufficient documentation

## 2020-08-26 DIAGNOSIS — Y9289 Other specified places as the place of occurrence of the external cause: Secondary | ICD-10-CM | POA: Insufficient documentation

## 2020-08-26 DIAGNOSIS — R110 Nausea: Secondary | ICD-10-CM | POA: Insufficient documentation

## 2020-08-26 DIAGNOSIS — Y9389 Activity, other specified: Secondary | ICD-10-CM | POA: Insufficient documentation

## 2020-08-26 DIAGNOSIS — I10 Essential (primary) hypertension: Secondary | ICD-10-CM | POA: Insufficient documentation

## 2020-08-26 DIAGNOSIS — S0990XA Unspecified injury of head, initial encounter: Secondary | ICD-10-CM

## 2020-08-26 DIAGNOSIS — F1290 Cannabis use, unspecified, uncomplicated: Secondary | ICD-10-CM | POA: Insufficient documentation

## 2020-08-26 DIAGNOSIS — Y990 Civilian activity done for income or pay: Secondary | ICD-10-CM | POA: Insufficient documentation

## 2020-08-26 DIAGNOSIS — W228XXA Striking against or struck by other objects, initial encounter: Secondary | ICD-10-CM | POA: Insufficient documentation

## 2020-08-26 LAB — CBC WITH DIFFERENTIAL/PLATELET
Abs Immature Granulocytes: 0.03 10*3/uL (ref 0.00–0.07)
Basophils Absolute: 0.1 10*3/uL (ref 0.0–0.1)
Basophils Relative: 1 %
Eosinophils Absolute: 0.1 10*3/uL (ref 0.0–0.5)
Eosinophils Relative: 2 %
HCT: 38.4 % (ref 36.0–46.0)
Hemoglobin: 12.7 g/dL (ref 12.0–15.0)
Immature Granulocytes: 0 %
Lymphocytes Relative: 44 %
Lymphs Abs: 3.9 10*3/uL (ref 0.7–4.0)
MCH: 31.6 pg (ref 26.0–34.0)
MCHC: 33.1 g/dL (ref 30.0–36.0)
MCV: 95.5 fL (ref 80.0–100.0)
Monocytes Absolute: 0.6 10*3/uL (ref 0.1–1.0)
Monocytes Relative: 7 %
Neutro Abs: 4.2 10*3/uL (ref 1.7–7.7)
Neutrophils Relative %: 46 %
Platelets: 196 10*3/uL (ref 150–400)
RBC: 4.02 MIL/uL (ref 3.87–5.11)
RDW: 12.4 % (ref 11.5–15.5)
WBC: 8.9 10*3/uL (ref 4.0–10.5)
nRBC: 0 % (ref 0.0–0.2)

## 2020-08-26 LAB — COMPREHENSIVE METABOLIC PANEL
ALT: 29 U/L (ref 0–44)
AST: 27 U/L (ref 15–41)
Albumin: 3.8 g/dL (ref 3.5–5.0)
Alkaline Phosphatase: 61 U/L (ref 38–126)
Anion gap: 6 (ref 5–15)
BUN: 13 mg/dL (ref 6–20)
CO2: 28 mmol/L (ref 22–32)
Calcium: 8.8 mg/dL — ABNORMAL LOW (ref 8.9–10.3)
Chloride: 104 mmol/L (ref 98–111)
Creatinine, Ser: 0.62 mg/dL (ref 0.44–1.00)
GFR, Estimated: >60 mL/min (ref >60)
Glucose, Bld: 101 mg/dL — ABNORMAL HIGH (ref 70–99)
Potassium: 3.9 mmol/L (ref 3.5–5.1)
Sodium: 138 mmol/L (ref 135–145)
Total Bilirubin: 0.2 mg/dL — ABNORMAL LOW (ref 0.3–1.2)
Total Protein: 6.7 g/dL (ref 6.5–8.1)

## 2020-08-26 LAB — I-STAT BETA HCG BLOOD, ED (MC, WL, AP ONLY): I-stat hCG, quantitative: <5 m[IU]/mL (ref <5)

## 2020-08-26 MED ORDER — ONDANSETRON HCL 4 MG/2ML IJ SOLN: 4.0000 mg | Freq: Once | INTRAMUSCULAR | Status: DC

## 2020-08-26 MED ORDER — SODIUM CHLORIDE 0.9 % IV BOLUS
1000.0000 mL | Freq: Once | INTRAVENOUS | Status: AC
  Administered 2020-08-26: 1000 mL via INTRAVENOUS

## 2020-08-26 MED ORDER — ONDANSETRON 8 MG PO TBDP
8.0000 mg | ORAL_TABLET | Freq: Once | ORAL | Status: DC
  Administered 2020-08-26: 8 mg via ORAL
  Filled 2020-08-26: qty 1

## 2020-08-26 NOTE — ED Notes (Signed)
Placido Sou, [place] aware pts BP 85/55. PA bedside.

## 2020-08-26 NOTE — ED Notes (Signed)
Pt performed trial ambulation with steady gait. Provider notified

## 2020-08-26 NOTE — ED Notes (Signed)
Pt verbalized understanding of d/c and follow up care. Ambulatory with steady gait.  

## 2020-08-26 NOTE — ED Provider Notes (Signed)
Emergency Medicine Provider Triage Evaluation Note  TANAYSIA BHARDWAJ , a 30 y.o. female  was evaluated in triage.  Pt complains of head trauma.  Patient states that she was working on a trailer with a relative and stood up and struck her head on a piece of metal that was sticking out.  She reports a small laceration of the forehead.  She states that she briefly lost consciousness "for about 1 second".  Reports a headache as well as bright flashing lights in her vision.  Denies any visual loss.  No anticoagulation.  States her Tdap was recently updated in [place].  Denies any neck pain, chest pain, shortness of breath, back pain, abdominal pain, or vomiting.  She does endorse mild nausea.  She states that she smokes marijuana and takes p.o. opiates regularly.  She last took opiates this morning.  Physical Exam  BP (!) 85/55 (BP Location: Left Arm)   Pulse (!) 59   Temp 98.4 F (36.9 C) (Oral)   Resp 16   Ht 5\' 4"  (1.626 m)   Wt 59 kg   SpO2 97%   BMI 22.31 kg/m  Gen:   Awake, no distress   Resp:  Normal effort  MSK:   Moves extremities without difficulty  Other:    Medical Decision Making  Medically screening exam initiated at 8:51 PM.  Appropriate orders placed.  was informed that the remainder of the evaluation will be completed by another provider, this initial triage assessment does not replace that evaluation, and the importance of remaining in the ED until their evaluation is complete.   Flossie Buffy, PA-C 08/26/20 2053    2054, MD 08/27/20 0002

## 2020-08-26 NOTE — ED Provider Notes (Signed)
[facility]   CSN: [id number] Arrival date & time: 08/26/20  2032     History Chief Complaint  Patient presents with   Head Injury    [name] is a 30 y.o. female.  HPI [name] , a 30 y.o. female  was evaluated in triage.  Pt complains of head trauma.  Patient states that she was working on a trailer with a relative and stood up and struck her head on a piece of metal that was sticking out.  She reports a small laceration to the forehead.  She states that she briefly lost consciousness "for about 1 second".  Reports a headache as well as bright flashing lights in her vision.  Feels that the visual changes are improving. Denies any visual loss.  No anticoagulation.  States her Tdap was recently updated in prison.  Denies any neck pain, chest pain, shortness of breath, back pain, abdominal pain, or vomiting.  She does endorse mild nausea.  She states that she smokes marijuana and takes p.o. opiates regularly.  She last took opiates this morning.    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   Status post tubal ligation 11/21/2015   Postpartum care following vaginal delivery 11/20/2015   Post term pregnancy at [redacted] weeks gestation 11/19/2015   PTSD (post-traumatic stress disorder)    Depression    Bipolar 1 disorder (HCC)    Major depression  02/16/2013   Alcohol abuse 02/16/2013   Anxiety state, unspecified 02/16/2013    Past Surgical History:  Procedure Laterality Date   MOUTH SURGERY     TUBAL LIGATION Bilateral 11/21/2015   Procedure: POST PARTUM TUBAL LIGATION;  Surgeon: Conard NovakStephen D Jackson, MD;  Location: [facility] ORS;  Service: Gynecology;  Laterality: Bilateral;     OB History     Gravida  5   Para  4   Term  4   Preterm      AB  1   Living  4      SAB  1   IAB      Ectopic      Multiple  0   Live Births  4           Family History  Problem Relation Age of Onset   Alcohol abuse Mother    Depression Mother    Heart attack Mother    Alcohol abuse Father    Diabetes Father     Social History   Tobacco Use   Smoking status: Every Day    Packs/day: 0.50    Types: Cigarettes   Smokeless tobacco: Never  Substance Use Topics   Alcohol use: Yes    Alcohol/week: 0.0 standard drinks    Comment: Patient endorses drinking occassionally. Amount unspecified    Drug use: Yes    Types:  Methamphetamines, Heroin, Amphetamines, Marijuana, Cocaine, IV    Comment: heroin    Home Medications Prior to Admission medications   Medication Sig Start Date End Date Taking? Authorizing Provider  acetaminophen (TYLENOL) 325 MG tablet Take 2 tablets (650 mg total) by mouth every 6 (six) hours as needed for mild pain (or Fever >/= 101). 07/18/18   Regalado, Belkys A, MD  buprenorphine-naloxone (SUBOXONE) 8-2 mg SUBL SL tablet Place 1 tablet under the tongue daily. 07/18/18   Tyson Alias, MD  ciprofloxacin-dexamethasone [facility]) OTIC suspension Place 4 drops into the left ear 2 (two) times daily. 11/06/18   Darci Current, MD  ibuprofen (ADVIL,MOTRIN) 600 MG tablet Take 1 tablet (600 mg total) by mouth every 6 (six) hours as needed. Patient not taking: Reported on 05/30/2018 06/11/17   Elpidio Anis, PA-C  lamoTRIgine (LAMICTAL) 100 MG tablet Take 100 mg by mouth 2 (two) times daily.    [provider]  Multiple Vitamin (MULTIVITAMIN WITH MINERALS) TABS tablet Take 1 tablet by mouth daily. Patient not taking: Reported on 07/15/2018 06/03/18   Malvin Johns, MD  nicotine (NICODERM CQ - DOSED IN MG/24 HOURS) 21 mg/24hr patch Place 1 patch (21 mg total) onto the skin daily. 07/19/18   Regalado, Belkys A, MD  sulfamethoxazole-trimethoprim (BACTRIM DS) 800-160 MG tablet Take 1 tablet by mouth 2 (two) times daily. 07/18/18   Regalado, Prentiss Bells, MD    Allergies    Patient has no known allergies.  Review of Systems   Review of Systems  All other systems reviewed and are negative. Ten systems reviewed and are negative for acute change, except as noted in the HPI.   Physical Exam Updated Vital Signs BP 91/73   Pulse (!) 56   Temp 98.4 F (36.9 C) (Oral)   Resp 16   Ht 5\' 4"  (1.626 m)   Wt 59 kg   SpO2 100%   BMI 22.31 kg/m   Physical Exam Vitals and nursing note reviewed.  Constitutional:      General: She is not in acute distress.    Appearance: Normal appearance. She is not ill-appearing, toxic-appearing or diaphoretic.  HENT:     Head: Normocephalic and atraumatic.     Right Ear: External ear normal.     Left Ear: External ear normal.     Nose: Nose normal.     Mouth/Throat:     Mouth: Mucous membranes are moist.     Pharynx: Oropharynx is clear. No oropharyngeal exudate or posterior oropharyngeal erythema.  Eyes:     General: No scleral icterus.       Right eye: No discharge.        Left eye: No discharge.     Extraocular Movements: Extraocular movements intact.     Conjunctiva/sclera: Conjunctivae normal.     Pupils: Pupils are equal, round, and reactive to light.     Comments: Pupils equal, mildly constricted and responsive to light.  Extraocular movements are intact.  Neck:     Comments: No midline C, T, or L-spine tenderness. Cardiovascular:     Rate and Rhythm: Normal rate and regular rhythm.     Pulses: Normal pulses.     Heart sounds: Normal heart  sounds. No murmur heard.   No friction rub. No gallop.  Pulmonary:     Effort: Pulmonary effort is normal. No respiratory distress.     Breath sounds: Normal breath sounds. No stridor. No wheezing, rhonchi or rales.  Abdominal:  General: Abdomen is flat.     Palpations: Abdomen is soft.     Tenderness: There is no abdominal tenderness.  Musculoskeletal:        General: Normal range of motion.     Cervical back: Normal range of motion and neck supple. No tenderness.  Skin:    General: Skin is warm and dry.  Neurological:     General: No focal deficit present.     Mental Status: She is alert and oriented to person, place, and time.     Comments: Patient is A&O x3.  Speaking clearly and coherently.  Moving all 4 extremities with ease.  No gross deficits.  Psychiatric:        Mood and Affect: Mood normal.        Behavior: Behavior normal.   ED Results / Procedures / Treatments   Labs (all labs ordered are listed, but only abnormal results are displayed) Labs Reviewed  COMPREHENSIVE METABOLIC PANEL - Abnormal; Notable for the following components:      Result Value   Glucose, Bld 101 (*)    Calcium 8.8 (*)    Total Bilirubin 0.2 (*)    All other components within normal limits  CBC WITH DIFFERENTIAL/PLATELET  I-STAT BETA HCG BLOOD, ED ([facility], [facility], [facility] ONLY)    EKG None  Radiology CT Head Wo Contrast  Result Date: 08/26/2020 CLINICAL DATA:  30 year old female with headache. EXAM: CT HEAD WITHOUT CONTRAST CT CERVICAL SPINE WITHOUT CONTRAST TECHNIQUE: Multidetector CT imaging of the head and cervical spine was performed following the standard protocol without intravenous contrast. Multiplanar CT image reconstructions of the cervical spine were also generated. COMPARISON:  Head CT dated 02/15/2013. FINDINGS: CT HEAD FINDINGS Brain: No evidence of acute infarction, hemorrhage, hydrocephalus, extra-axial collection or mass lesion/mass effect. Vascular: No hyperdense vessel or unexpected  calcification. Skull: Normal. Negative for fracture or focal lesion. Sinuses/Orbits: No acute finding. Other: None CT CERVICAL SPINE FINDINGS Alignment: No acute subluxation. There is reversal of normal cervical lordosis which may be positional or due to muscle spasm. Skull base and vertebrae: No acute fracture. Soft tissues and spinal canal: No prevertebral fluid or swelling. No visible canal hematoma. Disc levels: No acute findings. No significant degenerative changes. Upper chest: Negative. Other: None IMPRESSION: 1. Normal noncontrast CT of the brain. 2. No acute/traumatic cervical spine pathology. 3. Reversal of normal cervical lordosis which may be positional or due to muscle spasm. Electronically Signed   By: Elgie Collard M.D.   On: 08/26/2020 22:14   CT Cervical Spine Wo Contrast  Result Date: 08/26/2020 CLINICAL DATA:  30 year old female with headache. EXAM: CT HEAD WITHOUT CONTRAST CT CERVICAL SPINE WITHOUT CONTRAST TECHNIQUE: Multidetector CT imaging of the head and cervical spine was performed following the standard protocol without intravenous contrast. Multiplanar CT image reconstructions of the cervical spine were also generated. COMPARISON:  Head CT dated 02/15/2013. FINDINGS: CT HEAD FINDINGS Brain: No evidence of acute infarction, hemorrhage, hydrocephalus, extra-axial collection or mass lesion/mass effect. Vascular: No hyperdense vessel or unexpected calcification. Skull: Normal. Negative for fracture or focal lesion. Sinuses/Orbits: No acute finding. Other: None CT CERVICAL SPINE FINDINGS Alignment: No acute subluxation. There is reversal of normal cervical lordosis which may be positional or due to muscle spasm. Skull base and vertebrae: No acute fracture. Soft tissues and spinal canal: No prevertebral fluid or swelling. No visible canal hematoma. Disc levels: No acute findings. No significant degenerative changes. Upper chest: Negative. Other: None IMPRESSION: 1. Normal noncontrast CT  of the brain. 2. No acute/traumatic cervical spine pathology. 3. Reversal of normal cervical lordosis which may be positional or due to muscle spasm. Electronically Signed   By: Elgie Collard M.D.   On: 08/26/2020 22:14    Procedures Procedures   Medications Ordered in ED Medications  sodium chloride 0.9 % bolus 1,000 mL (0 mLs Intravenous Stopped 08/26/20 2216)  sodium chloride 0.9 % bolus 1,000 mL (1,000 mLs Intravenous Bolus from Bag 08/26/20 2223)    ED Course  I have reviewed the triage vital signs and the nursing notes.  Pertinent labs & imaging results that were available during my care of the patient were reviewed by me and considered in my medical decision making (see chart for details).    MDM Rules/Calculators/A&P                          Pt is a 30 y.o. female who presents to the emergency department after hitting her head on a metal trailer.   Labs: CBC without abnormalities. CMP with a glucose of 101, calcium of 8.8, total bilirubin of 0.2. I-STAT beta-hCG less than 5.  Imaging: CT scan of the head and cervical spine without contrast shows normal noncontrast CT of the brain.  No acute/traumatic cervical spine pathology.  Reversal of normal cervical lordosis which may be positional or due to muscle spasm.  I, Placido Sou, PA-C, personally reviewed and evaluated these images and lab results as part of my medical decision-making.  Physical exam reassuring.  Moving all 4 extremities with ease.  A&O x3.  No gross deficits.  Patient able to ambulate without difficulties.    She was found to be hypotensive on arrival.  She is an opiate user but states that she has not used any opiates since this morning.  Patient given 2 L of IV fluids.  She states that she is feeling much better.  BP improved.  Based on records it appears that her blood pressure is typically low with a systolic around 100.  Feel the patient is stable for discharge at this time and she is agreeable.   She was given strict return precautions.  Her questions were answered and she was amicable at the time of discharge.  Note: Portions of this report may have been transcribed using voice recognition software. Every effort was made to ensure accuracy; however, inadvertent computerized transcription errors may be present.   Final Clinical Impression(s) / ED Diagnoses Final diagnoses:  Injury of head, initial encounter   Rx / DC Orders ED Discharge Orders     None        Placido Sou, PA-C 08/26/20 2336    Koleen Distance, MD 08/27/20 0003

## 2020-08-26 NOTE — Discharge Instructions (Signed)
Please continue to monitor your symptoms closely.  If they worsen please come back to the emergency department.  It was a pleasure to meet you. 

## 2020-08-26 NOTE — ED Triage Notes (Signed)
Pt states she hit her head on the back of a trailer. Pt states she had LOC. Pt does not take blood thinners. Pt has small laceration to forehead, not currently bleeding. Pt A&Ox4.

## 2020-08-26 NOTE — ED Notes (Signed)
Pt currently in CT.

## 2020-09-23 ENCOUNTER — Encounter (HOSPITAL_COMMUNITY): Payer: Self-pay

## 2020-09-23 ENCOUNTER — Other Ambulatory Visit: Payer: Self-pay

## 2020-09-23 ENCOUNTER — Emergency Department (HOSPITAL_COMMUNITY)
Admission: EM | Admit: 2020-09-23 | Discharge: 2020-09-23 | Disposition: A | Discharge status: Home / Self Care | Payer: Medicaid Other | Attending: Emergency Medicine | Admitting: Emergency Medicine

## 2020-09-23 DIAGNOSIS — Z20822 Contact with and (suspected) exposure to covid-19: Secondary | ICD-10-CM | POA: Insufficient documentation

## 2020-09-23 DIAGNOSIS — F17210 Nicotine dependence, cigarettes, uncomplicated: Secondary | ICD-10-CM | POA: Insufficient documentation

## 2020-09-23 DIAGNOSIS — N3000 Acute cystitis without hematuria: Secondary | ICD-10-CM | POA: Insufficient documentation

## 2020-09-23 DIAGNOSIS — I10 Essential (primary) hypertension: Secondary | ICD-10-CM | POA: Insufficient documentation

## 2020-09-23 DIAGNOSIS — Z79899 Other long term (current) drug therapy: Secondary | ICD-10-CM | POA: Insufficient documentation

## 2020-09-23 LAB — COMPREHENSIVE METABOLIC PANEL
ALT: 97 U/L — ABNORMAL HIGH (ref 0–44)
AST: 66 U/L — ABNORMAL HIGH (ref 15–41)
Albumin: 3.8 g/dL (ref 3.5–5.0)
Alkaline Phosphatase: 66 U/L (ref 38–126)
Anion gap: 5 (ref 5–15)
BUN: 27 mg/dL — ABNORMAL HIGH (ref 6–20)
CO2: 28 mmol/L (ref 22–32)
Calcium: 9 mg/dL (ref 8.9–10.3)
Chloride: 106 mmol/L (ref 98–111)
Creatinine, Ser: 0.7 mg/dL (ref 0.44–1.00)
GFR, Estimated: >60 mL/min (ref >60)
Glucose, Bld: 125 mg/dL — ABNORMAL HIGH (ref 70–99)
Potassium: 4.2 mmol/L (ref 3.5–5.1)
Sodium: 139 mmol/L (ref 135–145)
Total Bilirubin: 0.4 mg/dL (ref 0.3–1.2)
Total Protein: 7 g/dL (ref 6.5–8.1)

## 2020-09-23 LAB — CBC WITH DIFFERENTIAL/PLATELET
Abs Immature Granulocytes: 0.03 10*3/uL (ref 0.00–0.07)
Basophils Absolute: 0 10*3/uL (ref 0.0–0.1)
Basophils Relative: 1 %
Eosinophils Absolute: 0.1 10*3/uL (ref 0.0–0.5)
Eosinophils Relative: 2 %
HCT: 40.5 % (ref 36.0–46.0)
Hemoglobin: 13.4 g/dL (ref 12.0–15.0)
Immature Granulocytes: 0 %
Lymphocytes Relative: 25 %
Lymphs Abs: 1.9 10*3/uL (ref 0.7–4.0)
MCH: 31.6 pg (ref 26.0–34.0)
MCHC: 33.1 g/dL (ref 30.0–36.0)
MCV: 95.5 fL (ref 80.0–100.0)
Monocytes Absolute: 0.4 10*3/uL (ref 0.1–1.0)
Monocytes Relative: 6 %
Neutro Abs: 4.8 10*3/uL (ref 1.7–7.7)
Neutrophils Relative %: 66 %
Platelets: 184 10*3/uL (ref 150–400)
RBC: 4.24 MIL/uL (ref 3.87–5.11)
RDW: 12.6 % (ref 11.5–15.5)
WBC: 7.3 10*3/uL (ref 4.0–10.5)
nRBC: 0 % (ref 0.0–0.2)

## 2020-09-23 LAB — URINALYSIS, ROUTINE W REFLEX MICROSCOPIC
Bilirubin Urine: NEGATIVE
Glucose, UA: NEGATIVE mg/dL
Hgb urine dipstick: NEGATIVE
Ketones, ur: NEGATIVE mg/dL
Nitrite: NEGATIVE
Protein, ur: NEGATIVE mg/dL
Specific Gravity, Urine: 1.008 (ref 1.005–1.030)
pH: 6 (ref 5.0–8.0)

## 2020-09-23 LAB — LACTIC ACID, PLASMA
Lactic Acid, Venous: 1.7 mmol/L (ref 0.5–1.9)
Lactic Acid, Venous: 2 mmol/L (ref 0.5–1.9)

## 2020-09-23 LAB — RESP PANEL BY RT-PCR (FLU A&B, COVID) ARPGX2
Influenza A by PCR: NEGATIVE
Influenza B by PCR: NEGATIVE
SARS Coronavirus 2 by RT PCR: NEGATIVE

## 2020-09-23 LAB — I-STAT BETA HCG BLOOD, ED (MC, WL, AP ONLY): I-stat hCG, quantitative: <5 m[IU]/mL (ref <5)

## 2020-09-23 MED ORDER — SODIUM CHLORIDE 0.9 % IV SOLN
1.0000 g | Freq: Once | INTRAVENOUS | Status: AC
  Administered 2020-09-23: 1 g via INTRAVENOUS
  Filled 2020-09-23: qty 10

## 2020-09-23 MED ORDER — CEFDINIR 300 MG PO CAPS: 300.0000 mg | ORAL_CAPSULE | Freq: Two times a day (BID) | ORAL | 0 refills | Status: AC

## 2020-09-23 MED ORDER — LACTATED RINGERS IV BOLUS (SEPSIS)
30.0000 mL/kg | Freq: Once | INTRAVENOUS | Status: AC
  Administered 2020-09-23: 1740 mL via INTRAVENOUS

## 2020-09-23 NOTE — ED Triage Notes (Signed)
Pt reports burning and frequency with urination, lower back pain x2 days. Pt reports near multiple near syncope episodes.

## 2020-09-23 NOTE — ED Notes (Signed)
When asked about drug use patient endorses that she uses weed and heroin.  Last heroin use was yesterday per patient.

## 2020-09-23 NOTE — ED Provider Notes (Signed)
[facility]ENC[facility] Provider Note   CSN: [id number] Arrival date & time: 09/23/20  1119     History Chief Complaint  Patient presents with   Urinary Frequency    [name] is a 30 y.o. female.  The history is provided by the patient.  Urinary Frequency This is a new problem. Episode onset: 3 days ago. The problem occurs constantly. The problem has not changed since onset.Pertinent negatives include no chest pain, no abdominal pain, no headaches and no shortness of breath. Nothing aggravates the symptoms. Nothing relieves the symptoms. She has tried nothing for the symptoms. The treatment provided no relief.      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   Status post tubal ligation 11/21/2015   Postpartum care following vaginal delivery 11/20/2015   Post term pregnancy at [redacted] weeks gestation 11/19/2015   PTSD (post-traumatic stress disorder)    Depression    Bipolar 1 disorder (HCC)    Major depression 02/16/2013   Alcohol abuse 02/16/2013   Anxiety state, unspecified 02/16/2013    Past Surgical History:  Procedure Laterality Date   MOUTH SURGERY     TUBAL LIGATION Bilateral 11/21/2015   Procedure: POST PARTUM TUBAL LIGATION;  Surgeon: Conard Novak, MD;  Location: [facility] ORS;  Service: Gynecology;  Laterality: Bilateral;     OB History     Gravida  5    Para  4   Term  4   Preterm      AB  1   Living  4      SAB  1   IAB      Ectopic      Multiple  0   Live Births  4           Family History  Problem Relation Age of Onset   Alcohol abuse Mother    Depression Mother    Heart attack Mother    Alcohol abuse Father    Diabetes Father     Social History   Tobacco Use   Smoking status: Every Day    Packs/day: 0.50    Types: Cigarettes   Smokeless tobacco: Never  Substance Use Topics   Alcohol use: Yes    Alcohol/week: 0.0 standard drinks    Comment: Patient endorses drinking occassionally. Amount unspecified    Drug use: Yes    Types: Methamphetamines, Heroin, Amphetamines, Marijuana, Cocaine, IV    Comment: heroin    Home Medications Prior to Admission medications   Medication Sig Start Date End Date Taking? Authorizing Provider  acetaminophen (TYLENOL) 325 MG tablet Take 2 tablets (650 mg total) by mouth every 6 (six) hours as needed for mild pain (or Fever >/= 101). 07/18/18   Regalado, Belkys A, MD  buprenorphine-naloxone (SUBOXONE) 8-2 mg SUBL SL tablet Place 1 tablet under the tongue daily.  07/18/18   Tyson Alias, MD  ciprofloxacin-dexamethasone [facility]) OTIC suspension Place 4 drops into the left ear 2 (two) times daily. 11/06/18   Darci Current, MD  ibuprofen (ADVIL,MOTRIN) 600 MG tablet Take 1 tablet (600 mg total) by mouth every 6 (six) hours as needed. Patient not taking: Reported on 05/30/2018 06/11/17   Elpidio Anis, PA-C  lamoTRIgine (LAMICTAL) 100 MG tablet Take 100 mg by mouth 2 (two) times daily.    [provider]  Multiple Vitamin (MULTIVITAMIN WITH MINERALS) TABS tablet Take 1 tablet by mouth daily. Patient not taking: Reported on 07/15/2018 06/03/18   Malvin Johns, MD  nicotine (NICODERM CQ - DOSED IN MG/24 HOURS) 21 mg/24hr patch Place 1 patch (21 mg total) onto the skin daily. 07/19/18   Regalado, Belkys A, MD  sulfamethoxazole-trimethoprim (BACTRIM DS) 800-160 MG  tablet Take 1 tablet by mouth 2 (two) times daily. 07/18/18   Regalado, Prentiss Bells, MD    Allergies    Patient has no known allergies.  Review of Systems   Review of Systems  Constitutional:  Positive for fever (subjective). Negative for chills.  HENT:  Negative for ear pain and sore throat.   Eyes:  Negative for pain and visual disturbance.  Respiratory:  Negative for cough and shortness of breath.   Cardiovascular:  Negative for chest pain and palpitations.  Gastrointestinal:  Positive for nausea. Negative for abdominal pain, diarrhea and vomiting.  Genitourinary:  Positive for dysuria and frequency. Negative for hematuria and vaginal discharge.  Musculoskeletal:  Positive for back pain (across lower back). Negative for arthralgias.  Skin:  Negative for color change and rash.  Neurological:  Negative for seizures, syncope and headaches.  All other systems reviewed and are negative.  Physical Exam Updated Vital Signs BP (!) 89/55   Pulse 62   Temp 98.5 F (36.9 C)   Resp 12   Ht 5\' 4"  (1.626 m)   Wt 58 kg   SpO2 97%   BMI 21.95 kg/m   Physical Exam Vitals and nursing note reviewed.  HENT:     Head: Normocephalic and atraumatic.  Eyes:     General: No scleral icterus. Pulmonary:     Effort: Pulmonary effort is normal. No respiratory distress.  Abdominal:     General: There is no distension.     Tenderness: There is no abdominal tenderness. There is no right CVA tenderness or left CVA tenderness.  Musculoskeletal:     Cervical back: Normal range of motion.  Skin:    General: Skin is warm and dry.  Neurological:     General: No focal deficit present.     Mental Status: She is alert and oriented to person, place, and time.  Psychiatric:        Mood and Affect: Mood normal.    ED Results / Procedures / Treatments   Labs (all labs ordered are listed, but only abnormal results are displayed) Labs Reviewed  COMPREHENSIVE METABOLIC PANEL - Abnormal; Notable for the  following components:      Result Value   Glucose, Bld 125 (*)    BUN 27 (*)    AST 66 (*)    ALT 97 (*)    All other components within normal limits  URINALYSIS, ROUTINE W REFLEX MICROSCOPIC - Abnormal; Notable for the following components:   Color, Urine STRAW (*)    Leukocytes,Ua MODERATE (*)    Bacteria, UA RARE (*)    All other components within normal limits  RESP PANEL BY RT-PCR (FLU A&B, COVID) ARPGX2  CULTURE, BLOOD (SINGLE)  URINE CULTURE  LACTIC ACID, PLASMA  CBC WITH DIFFERENTIAL/PLATELET  LACTIC ACID, PLASMA  I-STAT BETA HCG BLOOD, ED ([facility], [facility], [facility] ONLY)  GC/CHLAMYDIA PROBE AMP ([place]) NOT AT [facility]    EKG None  Radiology No results found.  Procedures Procedures   Medications Ordered in ED Medications  cefTRIAXone (ROCEPHIN) 1 g in sodium chloride 0.9 % 100 mL IVPB (1 g Intravenous New Bag/Given 09/23/20 1501)  lactated ringers bolus 1,740 mL (0 mL/kg  58 kg Intravenous Stopped 09/23/20 1455)    ED Course  I have reviewed the triage vital signs and the nursing notes.  Pertinent labs & imaging results that were available during my care of the patient were reviewed by me and considered in my medical decision making (see chart for details).    MDM Rules/Calculators/A&P                           Flossie Buffy presents with 3 days of symptoms of urinary frequency and dysuria.  She has a significant history of heroin use and has abused alcohol in the past.  She presented quite hypotensive but not tachycardic.  Review of former blood pressure values reveal slight hypertension, but her values are not incredibly different than previous recorded values.  Lactic acid was not elevated.  White count was reassuring.  I think that pyelonephritis is a possibility, and I did give her Rocephin.  I also wanted to treat her with Omnicef due to this possibility.  However, I think she is appropriate for outpatient management.  Chlamydia and gonorrhea testing is pending.  She  also discussed the fact that she has been diagnosed with hepatitis C in the past and has not had good follow-up.  She was given information on resources for financial assistance as well as the community health and wellness clinic. Final Clinical Impression(s) / ED Diagnoses Final diagnoses:  Acute cystitis without hematuria    Rx / DC Orders ED Discharge Orders          Ordered    cefdinir (OMNICEF) 300 MG capsule  2 times daily        09/23/20 1501             Koleen Distance, MD 09/23/20 1507

## 2020-09-24 LAB — GC/CHLAMYDIA PROBE AMP (~~LOC~~) NOT AT ARMC
Chlamydia: NEGATIVE
Comment: NEGATIVE
Comment: NORMAL
Neisseria Gonorrhea: NEGATIVE

## 2020-09-25 LAB — URINE CULTURE: Culture: NO GROWTH

## 2020-09-28 LAB — CULTURE, BLOOD (SINGLE)
Culture: NO GROWTH
Special Requests: ADEQUATE

## 2020-10-26 ENCOUNTER — Other Ambulatory Visit: Payer: Self-pay

## 2020-10-26 ENCOUNTER — Emergency Department (HOSPITAL_COMMUNITY)
Admission: EM | Admit: 2020-10-26 | Discharge: 2020-10-26 | Disposition: A | Discharge status: Home / Self Care | Payer: Medicaid Other | Attending: Emergency Medicine | Admitting: Emergency Medicine

## 2020-10-26 ENCOUNTER — Other Ambulatory Visit (HOSPITAL_COMMUNITY): Payer: Self-pay

## 2020-10-26 ENCOUNTER — Encounter (HOSPITAL_COMMUNITY): Payer: Self-pay

## 2020-10-26 DIAGNOSIS — F17210 Nicotine dependence, cigarettes, uncomplicated: Secondary | ICD-10-CM | POA: Insufficient documentation

## 2020-10-26 DIAGNOSIS — Z202 Contact with and (suspected) exposure to infections with a predominantly sexual mode of transmission: Secondary | ICD-10-CM | POA: Insufficient documentation

## 2020-10-26 DIAGNOSIS — I10 Essential (primary) hypertension: Secondary | ICD-10-CM | POA: Insufficient documentation

## 2020-10-26 DIAGNOSIS — A599 Trichomoniasis, unspecified: Secondary | ICD-10-CM

## 2020-10-26 LAB — URINALYSIS, ROUTINE W REFLEX MICROSCOPIC
Bilirubin Urine: NEGATIVE
Glucose, UA: NEGATIVE mg/dL
Hgb urine dipstick: NEGATIVE
Ketones, ur: NEGATIVE mg/dL
Nitrite: POSITIVE — AB
Protein, ur: NEGATIVE mg/dL
Specific Gravity, Urine: 1.03 (ref 1.005–1.030)
WBC, UA: >50 WBC/hpf — ABNORMAL HIGH (ref 0–5)
pH: 6 (ref 5.0–8.0)

## 2020-10-26 LAB — WET PREP, GENITAL
Clue Cells Wet Prep HPF POC: NONE SEEN
Sperm: NONE SEEN
Yeast Wet Prep HPF POC: NONE SEEN

## 2020-10-26 LAB — HIV ANTIBODY (ROUTINE TESTING W REFLEX): HIV Screen 4th Generation wRfx: NONREACTIVE

## 2020-10-26 MED ORDER — DOXYCYCLINE HYCLATE 100 MG PO TABS
100.0000 mg | ORAL_TABLET | Freq: Two times a day (BID) | ORAL | 0 refills | Status: DC
  Filled 2020-10-26: qty 28, 14d supply, fill #0

## 2020-10-26 MED ORDER — CEFTRIAXONE SODIUM 1 G IJ SOLR
500.0000 mg | Freq: Once | INTRAMUSCULAR | Status: AC
  Administered 2020-10-26: 500 mg via INTRAMUSCULAR
  Filled 2020-10-26: qty 10

## 2020-10-26 MED ORDER — LIDOCAINE HCL 1 % IJ SOLN
INTRAMUSCULAR | Status: AC
  Administered 2020-10-26: 2.1 mL
  Filled 2020-10-26: qty 20

## 2020-10-26 MED ORDER — FOSFOMYCIN TROMETHAMINE 3 G PO PACK
3.0000 g | PACK | Freq: Once | ORAL | Status: AC
  Administered 2020-10-26: 3 g via ORAL
  Filled 2020-10-26: qty 3

## 2020-10-26 MED ORDER — AZITHROMYCIN 250 MG PO TABS
1000.0000 mg | ORAL_TABLET | Freq: Once | ORAL | Status: AC
  Administered 2020-10-26: 1000 mg via ORAL
  Filled 2020-10-26: qty 4

## 2020-10-26 MED ORDER — METRONIDAZOLE 500 MG PO TABS
2000.0000 mg | ORAL_TABLET | Freq: Once | ORAL | Status: AC
  Administered 2020-10-26: 2000 mg via ORAL
  Filled 2020-10-26: qty 4

## 2020-10-26 MED ORDER — PENICILLIN G BENZATHINE 1200000 UNIT/2ML IM SUSY
2.4000 10*6.[IU] | PREFILLED_SYRINGE | Freq: Once | INTRAMUSCULAR | Status: AC
  Administered 2020-10-26: 2.4 10*6.[IU] via INTRAMUSCULAR
  Filled 2020-10-26: qty 4

## 2020-10-26 MED ORDER — DOXYCYCLINE HYCLATE 100 MG PO TABS
100.0000 mg | ORAL_TABLET | Freq: Two times a day (BID) | ORAL | 0 refills | Status: AC
  Filled 2020-10-26 – 2020-11-22 (×2): qty 28, 14d supply, fill #0

## 2020-10-26 NOTE — ED Provider Notes (Signed)
[facility]-EMERGENCY DEPT Provider Note   CSN: [id number] Arrival date & time: 10/26/20  0908     History Chief Complaint  Patient presents with   Exposure to STD   Dysuria    [name] is a 30 y.o. female.  30 yo F with a chief complaints for an STD exposure.  The patient tells me that she was told about 48 hours ago that one of her sexual contacts in December, 9 months ago was diagnosed with syphilis.  She tells me that he was treated.  She is unsure if he had any other infections.  She does report having a rash that was nondescript and eventually went away on its own after her sexual exposure.  She is also had some erythematous areas pop up on the plantar surface of her feet off and on.  She has also had recurrent urinary tract infections over the past month or 2.  She has had some whitish vaginal discharge.  Denies pelvic pain.  Is currently having urinary tract like symptoms.  The history is provided by the patient.  Exposure to STD This is a new problem. Episode onset: 9 months ago. The problem occurs rarely. The problem has been resolved. Pertinent negatives include no chest pain, no headaches and no shortness of breath. Nothing aggravates the symptoms. Nothing relieves the symptoms. She has tried nothing for the symptoms.  Dysuria Associated symptoms: no fever, no nausea and no vomiting       Past Medical History:  Diagnosis Date   ADD (attention deficit disorder)    Anxiety    Bipolar 1 disorder (HCC)    Depression    HA (headache)    Hepatitis    Hypertension    PTSD (post-traumatic stress disorder)    Status post tubal ligation 11/21/2015   Postpartum    Patient Active Problem List   Diagnosis Date Noted   Sepsis (HCC) 07/15/2018   Substance use disorder 07/15/2018   Abscess of right axilla 07/15/2018   Hepatitis C antibody test positive 07/15/2018   MDD (major depressive disorder), severe (HCC) 05/31/2018   Major depressive  disorder, 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   Status post tubal ligation 11/21/2015   Postpartum care following vaginal delivery 11/20/2015   Post term pregnancy at [redacted] weeks gestation 11/19/2015   PTSD (post-traumatic stress disorder)    Depression    Bipolar 1 disorder (HCC)    Major depression 02/16/2013   Alcohol abuse 02/16/2013   Anxiety state, unspecified 02/16/2013    Past Surgical History:  Procedure Laterality Date   MOUTH SURGERY     TUBAL LIGATION Bilateral 11/21/2015   Procedure: POST PARTUM TUBAL LIGATION;  Surgeon: Conard Novak, MD;  Location: [facility] ORS;  Service: Gynecology;  Laterality: Bilateral;     OB History     Gravida  5   Para  4   Term  4   Preterm      AB  1   Living  4      SAB  1   IAB      Ectopic      Multiple  0   Live Births  4           Family History  Problem Relation Age of Onset   Alcohol abuse Mother    Depression Mother    Heart attack Mother    Alcohol abuse Father    Diabetes Father     Social  History   Tobacco Use   Smoking status: Every Day    Packs/day: 0.50    Types: Cigarettes   Smokeless tobacco: Never  Substance Use Topics   Alcohol use: Yes    Alcohol/week: 0.0 standard drinks    Comment: Patient endorses drinking occassionally. Amount unspecified    Drug use: Yes    Types: Methamphetamines, Heroin, Amphetamines, Marijuana, Cocaine, IV    Comment: heroin    Home Medications Prior to Admission medications   Medication Sig Start Date End Date Taking? Authorizing Provider  doxycycline (VIBRAMYCIN) 100 MG capsule Take 1 capsule (100 mg total) by mouth 2 (two) times daily for 14 days. One po bid x 7 days 10/26/20 11/09/20 Yes Melene Plan, DO  acetaminophen (TYLENOL) 325 MG tablet Take 2 tablets (650 mg total) by mouth every 6 (six) hours as needed for mild pain (or Fever >/= 101). 07/18/18   Regalado, Belkys A, MD  nicotine (NICODERM CQ - DOSED IN MG/24 HOURS) 21 mg/24hr  patch Place 1 patch (21 mg total) onto the skin daily. Patient not taking: No sig reported 07/19/18   Regalado, Jon Billings A, MD    Allergies    Patient has no known allergies.  Review of Systems   Review of Systems  Constitutional:  Negative for chills and fever.  HENT:  Negative for congestion and rhinorrhea.   Eyes:  Negative for redness and visual disturbance.  Respiratory:  Negative for shortness of breath and wheezing.   Cardiovascular:  Negative for chest pain and palpitations.  Gastrointestinal:  Negative for nausea and vomiting.  Genitourinary:  Positive for dysuria. Negative for urgency.  Musculoskeletal:  Negative for arthralgias and myalgias.  Skin:  Negative for pallor and wound.  Neurological:  Negative for dizziness and headaches.   Physical Exam Updated Vital Signs BP 118/73 (BP Location: Left Arm)   Pulse 72   Temp 98.3 F (36.8 C) (Oral)   Resp 20   SpO2 100%   Physical Exam Vitals and nursing note reviewed.  Constitutional:      General: She is not in acute distress.    Appearance: She is well-developed. She is not diaphoretic.  HENT:     Head: Normocephalic and atraumatic.  Eyes:     Pupils: Pupils are equal, round, and reactive to light.  Cardiovascular:     Rate and Rhythm: Normal rate and regular rhythm.     Heart sounds: No murmur heard.   No friction rub. No gallop.  Pulmonary:     Effort: Pulmonary effort is normal.     Breath sounds: No wheezing or rales.  Abdominal:     General: There is no distension.     Palpations: Abdomen is soft.     Tenderness: There is no abdominal tenderness.  Musculoskeletal:        General: No tenderness.     Cervical back: Normal range of motion and neck supple.  Skin:    General: Skin is warm and dry.  Neurological:     Mental Status: She is alert and oriented to person, place, and time.  Psychiatric:        Behavior: Behavior normal.    ED Results / Procedures / Treatments   Labs (all labs ordered are  listed, but only abnormal results are displayed) Labs Reviewed  WET PREP, GENITAL - Abnormal; Notable for the following components:      Result Value   Trich, Wet Prep PRESENT (*)    WBC, Wet Prep HPF  POC MANY (*)    All other components within normal limits  URINALYSIS, ROUTINE W REFLEX MICROSCOPIC - Abnormal; Notable for the following components:   Nitrite POSITIVE (*)    Leukocytes,Ua SMALL (*)    WBC, UA >50 (*)    Bacteria, UA FEW (*)    Non Squamous Epithelial 0-5 (*)    All other components within normal limits  RPR  HIV ANTIBODY (ROUTINE TESTING W REFLEX)  GC/CHLAMYDIA PROBE AMP ([facility]) NOT AT [facility]    EKG None  Radiology No results found.  Procedures Procedures   Medications Ordered in ED Medications  fosfomycin (MONUROL) packet 3 g (has no administration in time range)  metroNIDAZOLE (FLAGYL) tablet 2,000 mg (has no administration in time range)  cefTRIAXone (ROCEPHIN) injection 500 mg (500 mg Intramuscular Given 10/26/20 1044)  azithromycin (ZITHROMAX) tablet 1,000 mg (1,000 mg Oral Given 10/26/20 1043)  penicillin g benzathine (BICILLIN LA) 1200000 UNIT/2ML injection 2.4 Million Units (2.4 Million Units Intramuscular Given 10/26/20 1048)  lidocaine (XYLOCAINE) 1 % (with pres) injection (2.1 mLs  Given 10/26/20 1050)    ED Course  I have reviewed the triage vital signs and the nursing notes.  Pertinent labs & imaging results that were available during my care of the patient were reviewed by me and considered in my medical decision making (see chart for details).    MDM Rules/Calculators/A&P                           30 yo F with a chief complaints of possible syphilis exposure 9 months ago.   Trich positive.  UA with possible infection. Treat with antibiotics.  PCP follow up.   11:15 AM:  I have discussed the diagnosis/risks/treatment options with the patient and believe the pt to be eligible for discharge home to follow-up with PCP. We also discussed  returning to the ED immediately if new or worsening sx occur. We discussed the sx which are most concerning (e.g., sudden worsening pain, fever, inability to tolerate by mouth) that necessitate immediate return. Medications administered to the patient during their visit and any new prescriptions provided to the patient are listed below.  Medications given during this visit Medications  fosfomycin (MONUROL) packet 3 g (has no administration in time range)  metroNIDAZOLE (FLAGYL) tablet 2,000 mg (has no administration in time range)  cefTRIAXone (ROCEPHIN) injection 500 mg (500 mg Intramuscular Given 10/26/20 1044)  azithromycin (ZITHROMAX) tablet 1,000 mg (1,000 mg Oral Given 10/26/20 1043)  penicillin g benzathine (BICILLIN LA) 1200000 UNIT/2ML injection 2.4 Million Units (2.4 Million Units Intramuscular Given 10/26/20 1048)  lidocaine (XYLOCAINE) 1 % (with pres) injection (2.1 mLs  Given 10/26/20 1050)     The patient appears reasonably screen and/or stabilized for discharge and I doubt any other medical condition or other [facility] requiring further screening, evaluation, or treatment in the ED at this time prior to discharge.   Final Clinical Impression(s) / ED Diagnoses Final diagnoses:  STD exposure  Trichomonosis    Rx / DC Orders ED Discharge Orders          Ordered    doxycycline (VIBRAMYCIN) 100 MG capsule  2 times daily        10/26/20 1112             Melene Plan, DO 10/26/20 1115

## 2020-10-26 NOTE — ED Notes (Signed)
Verified with pharmacy to give Penicillin G injection (2 separate shots) in left outer quadrant of buttock on same side and give rocephin injection on right outer quadrant of buttock.

## 2020-10-26 NOTE — ED Notes (Signed)
Pt states exposure to STD and dysuria.

## 2020-10-26 NOTE — Discharge Instructions (Signed)
You tested positive for trichomonas, this is considered to be a sexually transmitted disease.  Please follow-up with health department in the office.  I prescribed you 2 weeks of antibiotics.  Please abstain from sexual contact until you have finished your course of antibiotics.  Please discuss with all your current sexual partners that you have an STD so they can also be tested and treated.

## 2020-10-27 ENCOUNTER — Other Ambulatory Visit (HOSPITAL_COMMUNITY): Payer: Self-pay

## 2020-10-27 LAB — GC/CHLAMYDIA PROBE AMP (~~LOC~~) NOT AT ARMC
Chlamydia: NEGATIVE
Comment: NEGATIVE
Comment: NORMAL
Neisseria Gonorrhea: NEGATIVE

## 2020-10-27 LAB — RPR
RPR Ser Ql: REACTIVE — AB
RPR Titer: 1:16 {titer}

## 2020-10-28 LAB — T.PALLIDUM AB, TOTAL: T Pallidum Abs: REACTIVE — AB

## 2020-11-03 ENCOUNTER — Other Ambulatory Visit (HOSPITAL_COMMUNITY): Payer: Self-pay

## 2020-11-22 ENCOUNTER — Other Ambulatory Visit (HOSPITAL_COMMUNITY): Payer: Self-pay

## 2020-12-09 ENCOUNTER — Emergency Department (HOSPITAL_COMMUNITY)
Admission: EM | Admit: 2020-12-09 | Discharge: 2020-12-11 | Disposition: A | Discharge status: Other Acute Inpatient Hospital | Payer: Self-pay | Attending: Emergency Medicine | Admitting: Emergency Medicine

## 2020-12-09 ENCOUNTER — Encounter (HOSPITAL_COMMUNITY): Payer: Self-pay

## 2020-12-09 ENCOUNTER — Emergency Department (HOSPITAL_COMMUNITY): Payer: Self-pay

## 2020-12-09 DIAGNOSIS — R45851 Suicidal ideations: Secondary | ICD-10-CM | POA: Insufficient documentation

## 2020-12-09 DIAGNOSIS — F4310 Post-traumatic stress disorder, unspecified: Secondary | ICD-10-CM | POA: Diagnosis present

## 2020-12-09 DIAGNOSIS — F17210 Nicotine dependence, cigarettes, uncomplicated: Secondary | ICD-10-CM | POA: Insufficient documentation

## 2020-12-09 DIAGNOSIS — Y900 Blood alcohol level of less than 20 mg/100 ml: Secondary | ICD-10-CM | POA: Insufficient documentation

## 2020-12-09 DIAGNOSIS — I10 Essential (primary) hypertension: Secondary | ICD-10-CM | POA: Insufficient documentation

## 2020-12-09 DIAGNOSIS — F1990 Other psychoactive substance use, unspecified, uncomplicated: Secondary | ICD-10-CM | POA: Diagnosis present

## 2020-12-09 DIAGNOSIS — R1011 Right upper quadrant pain: Secondary | ICD-10-CM | POA: Insufficient documentation

## 2020-12-09 DIAGNOSIS — Z20822 Contact with and (suspected) exposure to covid-19: Secondary | ICD-10-CM | POA: Insufficient documentation

## 2020-12-09 DIAGNOSIS — F332 Major depressive disorder, recurrent severe without psychotic features: Secondary | ICD-10-CM | POA: Insufficient documentation

## 2020-12-09 LAB — COMPREHENSIVE METABOLIC PANEL
ALT: 219 U/L — ABNORMAL HIGH (ref 0–44)
AST: 148 U/L — ABNORMAL HIGH (ref 15–41)
Albumin: 3.8 g/dL (ref 3.5–5.0)
Alkaline Phosphatase: 77 U/L (ref 38–126)
Anion gap: 3 — ABNORMAL LOW (ref 5–15)
BUN: 24 mg/dL — ABNORMAL HIGH (ref 6–20)
CO2: 28 mmol/L (ref 22–32)
Calcium: 8.8 mg/dL — ABNORMAL LOW (ref 8.9–10.3)
Chloride: 106 mmol/L (ref 98–111)
Creatinine, Ser: 0.69 mg/dL (ref 0.44–1.00)
GFR, Estimated: >60 mL/min (ref >60)
Glucose, Bld: 76 mg/dL (ref 70–99)
Potassium: 4.2 mmol/L (ref 3.5–5.1)
Sodium: 137 mmol/L (ref 135–145)
Total Bilirubin: 0.5 mg/dL (ref 0.3–1.2)
Total Protein: 7.4 g/dL (ref 6.5–8.1)

## 2020-12-09 LAB — CBC WITH DIFFERENTIAL/PLATELET
Abs Immature Granulocytes: 0.03 10*3/uL (ref 0.00–0.07)
Basophils Absolute: 0.1 10*3/uL (ref 0.0–0.1)
Basophils Relative: 1 %
Eosinophils Absolute: 0.2 10*3/uL (ref 0.0–0.5)
Eosinophils Relative: 2 %
HCT: 42 % (ref 36.0–46.0)
Hemoglobin: 13.9 g/dL (ref 12.0–15.0)
Immature Granulocytes: 0 %
Lymphocytes Relative: 45 %
Lymphs Abs: 3.4 10*3/uL (ref 0.7–4.0)
MCH: 31 pg (ref 26.0–34.0)
MCHC: 33.1 g/dL (ref 30.0–36.0)
MCV: 93.5 fL (ref 80.0–100.0)
Monocytes Absolute: 0.7 10*3/uL (ref 0.1–1.0)
Monocytes Relative: 8 %
Neutro Abs: 3.4 10*3/uL (ref 1.7–7.7)
Neutrophils Relative %: 44 %
Platelets: 283 10*3/uL (ref 150–400)
RBC: 4.49 MIL/uL (ref 3.87–5.11)
RDW: 12.8 % (ref 11.5–15.5)
WBC: 7.8 10*3/uL (ref 4.0–10.5)
nRBC: 0 % (ref 0.0–0.2)

## 2020-12-09 LAB — ETHANOL: Alcohol, Ethyl (B): <10 mg/dL (ref <10)

## 2020-12-09 LAB — RAPID URINE DRUG SCREEN, HOSP PERFORMED
Amphetamines: NOT DETECTED
Barbiturates: NOT DETECTED
Benzodiazepines: NOT DETECTED
Cocaine: NOT DETECTED
Opiates: POSITIVE — AB
Tetrahydrocannabinol: NOT DETECTED

## 2020-12-09 LAB — RESP PANEL BY RT-PCR (FLU A&B, COVID) ARPGX2
Influenza A by PCR: NEGATIVE
Influenza B by PCR: NEGATIVE
SARS Coronavirus 2 by RT PCR: NEGATIVE

## 2020-12-09 LAB — I-STAT BETA HCG BLOOD, ED (MC, WL, AP ONLY): I-stat hCG, quantitative: <5 m[IU]/mL (ref <5)

## 2020-12-09 LAB — ACETAMINOPHEN LEVEL: Acetaminophen (Tylenol), Serum: <10 ug/mL — ABNORMAL LOW (ref 10–30)

## 2020-12-09 LAB — SALICYLATE LEVEL: Salicylate Lvl: <7 mg/dL — ABNORMAL LOW (ref 7.0–30.0)

## 2020-12-09 MED ORDER — ONDANSETRON 4 MG PO TBDP
4.0000 mg | ORAL_TABLET | Freq: Four times a day (QID) | ORAL | Status: DC | PRN
  Administered 2020-12-09 – 2020-12-11 (×3): 4 mg via ORAL
  Filled 2020-12-09 (×3): qty 1

## 2020-12-09 MED ORDER — NAPROXEN 500 MG PO TABS
500.0000 mg | ORAL_TABLET | Freq: Two times a day (BID) | ORAL | Status: DC | PRN
  Administered 2020-12-09 – 2020-12-10 (×2): 500 mg via ORAL
  Filled 2020-12-09 (×2): qty 1

## 2020-12-09 MED ORDER — LOPERAMIDE HCL 2 MG PO CAPS: 2.0000 mg | ORAL_CAPSULE | ORAL | Status: DC | PRN

## 2020-12-09 MED ORDER — HYDROXYZINE HCL 25 MG PO TABS
25.0000 mg | ORAL_TABLET | Freq: Four times a day (QID) | ORAL | Status: DC | PRN
  Administered 2020-12-09 – 2020-12-11 (×4): 25 mg via ORAL
  Filled 2020-12-09 (×4): qty 1

## 2020-12-09 MED ORDER — DICYCLOMINE HCL 20 MG PO TABS
20.0000 mg | ORAL_TABLET | Freq: Four times a day (QID) | ORAL | Status: DC | PRN
  Administered 2020-12-09 – 2020-12-10 (×2): 20 mg via ORAL
  Filled 2020-12-09 (×2): qty 1

## 2020-12-09 MED ORDER — METHOCARBAMOL 500 MG PO TABS
500.0000 mg | ORAL_TABLET | Freq: Three times a day (TID) | ORAL | Status: DC | PRN
  Administered 2020-12-09 – 2020-12-10 (×2): 500 mg via ORAL
  Filled 2020-12-09 (×2): qty 1

## 2020-12-09 MED ORDER — NICOTINE 21 MG/24HR TD PT24
21.0000 mg | MEDICATED_PATCH | Freq: Every day | TRANSDERMAL | Status: DC
  Administered 2020-12-09 – 2020-12-11 (×2): 21 mg via TRANSDERMAL
  Filled 2020-12-09 (×2): qty 1

## 2020-12-09 MED ORDER — IOHEXOL 350 MG/ML SOLN
80.0000 mL | Freq: Once | INTRAVENOUS | Status: AC | PRN
  Administered 2020-12-09: 80 mL via INTRAVENOUS

## 2020-12-09 MED ORDER — DOCUSATE SODIUM 100 MG PO CAPS
100.0000 mg | ORAL_CAPSULE | Freq: Once | ORAL | Status: AC
  Administered 2020-12-09: 100 mg via ORAL
  Filled 2020-12-09: qty 1

## 2020-12-09 NOTE — ED Notes (Signed)
Pt ambulatory without assistance.  

## 2020-12-09 NOTE — ED Notes (Signed)
Pt transported from [place] [facility] to [id number] for TTS assessment.

## 2020-12-09 NOTE — ED Notes (Signed)
Pt belongings placed in nurse's station 16-18 cabinets.

## 2020-12-09 NOTE — ED Notes (Signed)
Pt requesting meds for withdrawal. MD Effie Shy made aware of pts sx and COWS score.

## 2020-12-09 NOTE — ED Notes (Signed)
Pt states she was able to have a successful BM at this time.
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
## 2020-12-09 NOTE — ED Provider Notes (Signed)
[facility]-EMERGENCY DEPT Provider Note   CSN: [id number] Arrival date & time: 12/09/20  7989     History Chief Complaint  Patient presents with   Drug Problem   Suicidal    [name] is a 30 y.o. female with a past medical history significant for ADHD, anxiety, bipolar 1 disorder, depression, hypertension, polysubstance abuse, and PTSD who presents to the ED requesting detox from heroin.  Patient also endorses passive suicidal ideations.  No plan.  She endorses a previous suicide attempt by overdosing.  Patient is requesting detox from heroin.  Last used last night around 9 PM.  She admits to using heroin daily for the past 5 years.  She endorses diaphoresis which is typical after heroin use for her.  No fever.  Denies alcohol use.  Admits to tobacco use.  Denies chest pain, shortness of breath, abdominal pain.  Denies HI and auditory/visual hallucinations.  No treatment prior to arrival.  No aggravating or alleviating factors.  History obtained from patient and past medical records. No interpreter used during encounter.       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   Status post tubal ligation 11/21/2015   Postpartum care following vaginal delivery 11/20/2015   Post term pregnancy at [redacted] weeks gestation 11/19/2015   PTSD (post-traumatic stress disorder)    Depression     Bipolar 1 disorder (HCC)    Major depression 02/16/2013   Alcohol abuse 02/16/2013   Anxiety state, unspecified 02/16/2013    Past Surgical History:  Procedure Laterality Date   MOUTH SURGERY     TUBAL LIGATION Bilateral 11/21/2015   Procedure: POST PARTUM TUBAL LIGATION;  Surgeon: Conard Novak, MD;  Location: [facility] ORS;  Service: Gynecology;  Laterality: Bilateral;     OB History     Gravida  5   Para  4   Term  4   Preterm      AB  1   Living  4      SAB  1   IAB      Ectopic      Multiple  0   Live Births  4           Family History  Problem Relation Age of Onset   Alcohol abuse Mother    Depression Mother    Heart attack Mother    Alcohol abuse Father    Diabetes Father     Social History   Tobacco Use   Smoking status: Every Day    Packs/day: 0.50    Types: Cigarettes   Smokeless tobacco: Never  Substance Use Topics   Alcohol use: Yes    Alcohol/week: 0.0 standard drinks    Comment: Patient endorses drinking occassionally. Amount unspecified    Drug use: Yes    Types: Methamphetamines, Heroin, Amphetamines, Marijuana, Cocaine,  IV    Comment: heroin    Home Medications Prior to Admission medications   Medication Sig Start Date End Date Taking? Authorizing Provider  aspirin EC 325 MG tablet Take 325 mg by mouth every 4 (four) hours as needed for mild pain.   Yes [provider]  doxycycline (ADOXA) 100 MG tablet Take 100 mg by mouth 2 (two) times daily.   Yes [provider]  ibuprofen (ADVIL) 200 MG tablet Take 400 mg by mouth every 6 (six) hours as needed for mild pain.   Yes [provider]    Allergies    Patient has no known allergies.  Review of Systems   Review of Systems  Constitutional:  Positive for diaphoresis. Negative for chills and fever.  Respiratory:  Negative for shortness of breath.   Cardiovascular:  Negative for chest pain.  Gastrointestinal:  Negative for abdominal pain.   Psychiatric/Behavioral:  Positive for suicidal ideas.   All other systems reviewed and are negative.  Physical Exam Updated Vital Signs BP (!) 124/93 (BP Location: Left Arm)   Pulse 67   Temp 98.4 F (36.9 C) (Oral)   Resp 16   Ht 5\' 6"  (1.676 m)   Wt 59 kg   LMP 12/09/2020   SpO2 100%   BMI 20.98 kg/m   Physical Exam Vitals and nursing note reviewed.  Constitutional:      General: She is not in acute distress.    Appearance: She is not ill-appearing.  HENT:     Head: Normocephalic.  Eyes:     Pupils: Pupils are equal, round, and reactive to light.  Cardiovascular:     Rate and Rhythm: Normal rate and regular rhythm.     Pulses: Normal pulses.     Heart sounds: Normal heart sounds. No murmur heard.   No friction rub. No gallop.  Pulmonary:     Effort: Pulmonary effort is normal.     Breath sounds: Normal breath sounds.  Abdominal:     General: Abdomen is flat. There is no distension.     Palpations: Abdomen is soft.     Tenderness: There is abdominal tenderness. There is no guarding or rebound.     Comments: +RUQ tenderness  Musculoskeletal:        General: Normal range of motion.     Cervical back: Neck supple.  Skin:    General: Skin is warm and dry.  Neurological:     General: No focal deficit present.     Mental Status: She is alert.  Psychiatric:        Mood and Affect: Mood normal.        Behavior: Behavior normal.        Thought Content: Thought content includes suicidal ideation. Thought content does not include homicidal ideation.    ED Results / Procedures / Treatments   Labs (all labs ordered are listed, but only abnormal results are displayed) Labs Reviewed  COMPREHENSIVE METABOLIC PANEL - Abnormal; Notable for the following components:      Result Value   BUN 24 (*)    Calcium 8.8 (*)    AST 148 (*)    ALT 219 (*)    Anion gap 3 (*)    All other components within normal limits  RAPID URINE DRUG SCREEN, HOSP PERFORMED - Abnormal; Notable  for the following components:   Opiates POSITIVE (*)    All other components within normal limits  ACETAMINOPHEN LEVEL - Abnormal; Notable for the  following components:   Acetaminophen (Tylenol), Serum <10 (*)    All other components within normal limits  SALICYLATE LEVEL - Abnormal; Notable for the following components:   Salicylate Lvl <7.0 (*)    All other components within normal limits  RESP PANEL BY RT-PCR (FLU A&B, COVID) ARPGX2  ETHANOL  CBC WITH DIFFERENTIAL/PLATELET  HEPATITIS PANEL, ACUTE  I-STAT BETA HCG BLOOD, ED ([facility], [facility], [facility] ONLY)    EKG None  Radiology CT ABDOMEN PELVIS W CONTRAST  Result Date: 12/09/2020 CLINICAL DATA:  Abdominal pain with rectal bleeding and constipation. EXAM: CT ABDOMEN AND PELVIS WITH CONTRAST TECHNIQUE: Multidetector CT imaging of the abdomen and pelvis was performed using the standard protocol following bolus administration of intravenous contrast. CONTRAST:  56mL OMNIPAQUE IOHEXOL 350 MG/ML SOLN COMPARISON:  Same day right upper quadrant ultrasound and CT November 26, 2015 FINDINGS: Lower chest: No acute abnormality. Hepatobiliary: No suspicious hepatic lesion. Cholelithiasis without findings of acute cholecystitis. No biliary ductal dilation. Pancreas: No pancreatic ductal dilation or evidence of acute inflammation. Spleen: Normal in size without focal abnormality. Adrenals/Urinary Tract: Adrenal glands are unremarkable. Kidneys are normal, without renal calculi, solid enhancing lesion, or hydronephrosis. Bladder is unremarkable for degree of distension. Stomach/Bowel: No enteric contrast was administered. Stomach is unremarkable for degree of distension. No pathologic dilation of small or large bowel. The appendix and terminal ileum appear normal. Large burden of formed stool throughout colon with some mild rectal wall thickening and adjacent inflammation. Vascular/Lymphatic: No abdominal aortic aneurysm. No pathologically enlarged abdominal or pelvic  lymph nodes. Reproductive: Uterus and bilateral adnexa are unremarkable. Other: No abdominopelvic ascites. Musculoskeletal: No acute or significant osseous findings. IMPRESSION: 1. Large burden of formed stool throughout colon with some mild rectal wall thickening and adjacent inflammation, suggestive of constipation with stercoral colitis. 2. Cholelithiasis without findings of acute cholecystitis. Electronically Signed   By: Maudry Mayhew M.D.   On: 12/09/2020 14:11   US Abdomen Limited RUQ (LIVER/GB)  Result Date: 12/09/2020 CLINICAL DATA:  Right upper quadrant pain EXAM: ULTRASOUND ABDOMEN LIMITED RIGHT UPPER QUADRANT COMPARISON:  None. FINDINGS: Gallbladder: Multiple gallstones. No wall thickening visualized. No sonographic Murphy sign noted by sonographer. Common bile duct: Diameter: 6 mm Liver: No focal lesion identified. Increased parenchymal echogenicity. Portal vein is patent on color Doppler imaging with normal direction of blood flow towards the liver. Other: None. IMPRESSION: 1. Cholelithiasis without sonographic evidence of acute cholecystitis. No biliary ductal dilatation. 2. Hepatic steatosis. Electronically Signed   By: Jearld Lesch M.D.   On: 12/09/2020 10:19    Procedures Procedures   Medications Ordered in ED Medications  nicotine (NICODERM CQ - dosed in mg/24 hours) patch 21 mg (21 mg Transdermal Patch Applied 12/09/20 1220)  docusate sodium (COLACE) capsule 100 mg (100 mg Oral Given 12/09/20 1221)  iohexol (OMNIPAQUE) 350 MG/ML injection 80 mL (80 mLs Intravenous Contrast Given 12/09/20 1355)    ED Course  I have reviewed the triage vital signs and the nursing notes.  Pertinent labs & imaging results that were available during my care of the patient were reviewed by me and considered in my medical decision making (see chart for details).  Clinical Course as of 12/09/20 1431  Thu Dec 09, 2020  3790 Opiates(!): POSITIVE [CA]    Clinical Course User Index [CA] Mannie Stabile, PA-C   MDM Rules/Calculators/A&P  30 year old female presents to the ED voluntarily requesting detox from heroin.  She also endorses passive suicidal ideations.  Patient notes she has been using heroin daily for the past 5 years.  Upon arrival, stable vitals.  Patient is afebrile, not tachycardic or hypoxic.  Patient nontoxic-appearing.  RUQ tenderness.  Medical clearance labs ordered.  CBC unremarkable.  No leukocytosis and normal hemoglobin.  Ethanol, acetaminophen, salicylate level within normal limits.  CMP significant for elevated AST at 148 and ALT at 219.  Per chart review, patient has had elevated LFTs in the past.  Likely due to known hepatitis however, will obtain right upper quadrant ultrasound.  Hepatitis panel ordered.  Pregnancy test negative.  RUQ [place] significant for cholelithiasis, but no evidence of acute cholecystitis. Given patient's severe tenderness, will discuss with general surgery.  12:59 PM Spoke with Tresa Endo, PA-C with general surgery who recommends HIDA scan. If normal, patient may be medically cleared for TTS evaluation.  1:40 PM Repeat abdominal exam now demonstrates tenderness throughout abdomen. Will obtain CT abdomen. Patient endorses severe constipation that started while here in the ED. Colace given.  2:28 PM Spoke with Lonia Farber with general surgery who reviewed CT abdomen. Low suspicion for acute cholecystitis. Suspect abdominal pain related due to significant stool burdern. No longer need HIDA scan. Patient given enema for symptomatic relief.   Patient has been medically cleared for TTS evaluation. She will need her LFTs rechecked in 1 week by PCP to ensure they are downtrending.   Final Clinical Impression(s) / ED Diagnoses Final diagnoses:  RUQ pain  Suicidal ideations    Rx / DC Orders ED Discharge Orders     None        Jesusita Oka 12/09/20 1432    Ernie Avena, MD 12/10/20 [phone]

## 2020-12-09 NOTE — ED Triage Notes (Signed)
Pt arrives today wanting to detox from heroin. Last use last night at 9pm. Pt has been using for 5 years. Pt is having general suicidal thoughts- no specific plan, but she has had attempts before via overdosing.

## 2020-12-10 NOTE — Consult Note (Signed)
[facility] Face-to-Face Psychiatry Consult   Reason for Consult:  SI Referring Physician:  [place] PA-C Patient Identification: [name] MRN:  [id number] Principal Diagnosis: Major depressive disorder, recurrent severe without psychotic features (HCC) Diagnosis:  Principal Problem:   Major depressive disorder, recurrent severe without psychotic features (HCC) Active Problems:   PTSD (post-traumatic stress disorder)   Substance use disorder   Total Time spent with patient: 20 minutes  Subjective:   [name] is a 30 y.o. female patient admitted with suicidal ideations and heroin withdrawal.  On assessment patient presents laying in bed resting. Mild diaphoresis. She endorses some withdrawal symptoms abdominal cramps; last COWS 2 @2319 . She denies any active suicidal or homicidal ideations. She does endorse depression related to addiction and has verbalized readiness for change related to current addiction. She denies any hallucinations (auditory or visual) and does not appear to be actively psychotic at this time.   HPI:  [name] is a 30 year old female with a past history of MDD, polysubstance abuse, accidental overdose, PTSD who presented voluntarily to [facility] with suicidal thoughts and detox from heroin. Last used reported 12/08/20; COWS initiated. UDS+opiates; BAL<10.   Past Psychiatric History: MDD, polysubstance abuse, Accidental overdose, PTSD  Risk to Self:  pt denies Risk to Others:  pt denies Prior Inpatient Therapy:  pt denies Prior Outpatient Therapy:  pt denies  Past Medical History:  Past Medical History:  Diagnosis Date   ADD (attention deficit disorder)    Anxiety    Bipolar 1 disorder (HCC)    Depression    HA (headache)    Hepatitis    Hypertension    PTSD (post-traumatic stress disorder)    Status post tubal ligation 11/21/2015   Postpartum    Past Surgical History:  Procedure Laterality Date   MOUTH SURGERY     TUBAL LIGATION  Bilateral 11/21/2015   Procedure: POST PARTUM TUBAL LIGATION;  Surgeon: 01/21/2016, MD;  Location: [facility] ORS;  Service: Gynecology;  Laterality: Bilateral;   Family History:  Family History  Problem Relation Age of Onset   Alcohol abuse Mother    Depression Mother    Heart attack Mother    Alcohol abuse Father    Diabetes Father    Family Psychiatric  History: not noted Social History:  Social History   Substance and Sexual Activity  Alcohol Use Yes   Alcohol/week: 0.0 standard drinks   Comment: Patient endorses drinking occassionally. Amount unspecified      Social History   Substance and Sexual Activity  Drug Use Yes   Types: Methamphetamines, Heroin, Amphetamines, Marijuana, Cocaine, IV   Comment: heroin    Social History   Socioeconomic History   Marital status: Single    Spouse name: Not on file   Number of children: 2   Years of education: 10 th   Highest education level: Not on file  Occupational History   Not on file  Tobacco Use   Smoking status: Every Day    Packs/day: 0.50    Types: Cigarettes   Smokeless tobacco: Never  Substance and Sexual Activity   Alcohol use: Yes    Alcohol/week: 0.0 standard drinks    Comment: Patient endorses drinking occassionally. Amount unspecified    Drug use: Yes    Types: Methamphetamines, Heroin, Amphetamines, Marijuana, Cocaine, IV    Comment: heroin   Sexual activity: Yes    Birth control/protection: Condom, Surgical  Other Topics Concern   Not on file  Social History Narrative   Patient lives at home with friends and she is single.   Unemployed.   Education 10 th grade.   Right handed.   Caffeine Mountain dew and pepsi five or more cups daily.   Social Determinants of Health   Financial Resource Strain: Not on file  Food Insecurity: Not on file  Transportation Needs: Not on file  Physical Activity: Not on file  Stress: Not on file  Social Connections: Not on file   Additional Social History:     Allergies:  No Known Allergies  Labs:  No results found for this or any previous visit (from the past 48 hour(s)).   Current Facility-Administered Medications  Medication Dose Route Frequency Provider Last Rate Last Admin   dicyclomine (BENTYL) tablet 20 mg  20 mg Oral Q6H PRN Mancel Bale, MD   20 mg at 12/10/20 1458   hydrOXYzine (ATARAX/VISTARIL) tablet 25 mg  25 mg Oral Q6H PRN Mancel Bale, MD   25 mg at 12/11/20 0916   loperamide (IMODIUM) capsule 2-4 mg  2-4 mg Oral PRN Mancel Bale, MD       methocarbamol (ROBAXIN) tablet 500 mg  500 mg Oral Q8H PRN Mancel Bale, MD   500 mg at 12/10/20 2035   naproxen (NAPROSYN) tablet 500 mg  500 mg Oral BID PRN Mancel Bale, MD   500 mg at 12/10/20 1814   nicotine (NICODERM CQ - dosed in mg/24 hours) patch 21 mg  21 mg Transdermal Daily Claudette Stapler C, PA-C   21 mg at 12/11/20 0916   ondansetron (ZOFRAN-ODT) disintegrating tablet 4 mg  4 mg Oral Q6H PRN Mancel Bale, MD   4 mg at 12/11/20 1156   Current Outpatient Medications  Medication Sig Dispense Refill   aspirin EC 325 MG tablet Take 325 mg by mouth every 4 (four) hours as needed for mild pain.     doxycycline (ADOXA) 100 MG tablet Take 100 mg by mouth 2 (two) times daily.     ibuprofen (ADVIL) 200 MG tablet Take 400 mg by mouth every 6 (six) hours as needed for mild pain.      Musculoskeletal: Strength & Muscle Tone: within normal limits Gait & Station: normal Patient leans: N/A  Psychiatric Specialty Exam:  Presentation  General Appearance: Casual; Disheveled  Eye Contact:Good  Speech:Clear and Coherent  Speech Volume:Normal  Handedness:No data recorded  Mood and Affect  Mood:Euthymic  Affect:Flat   Thought Process  Thought Processes:Coherent  Descriptions of Associations:Intact  Orientation:Partial  Thought Content:Logical  History of Schizophrenia/Schizoaffective disorder:No data recorded Duration of Psychotic Symptoms:No data  recorded Hallucinations:Hallucinations: None  Ideas of Reference:None  Suicidal Thoughts:Suicidal Thoughts: No  Homicidal Thoughts:Homicidal Thoughts: No   Sensorium  Memory:Immediate Fair; Recent Fair; Remote Fair  Judgment:Fair  Insight:Fair   Executive Functions  Concentration:Good  Attention Span:Good  Recall:Good  Fund of Knowledge:Good  Language:Good   Psychomotor Activity  Psychomotor Activity:Psychomotor Activity: Normal   Assets  Assets:Resilience; Physical Health; Desire for Improvement   Sleep  Sleep:Sleep: Good   Physical Exam: Physical Exam Vitals and nursing note reviewed.  Constitutional:      Appearance: She is normal weight. She is diaphoretic.  HENT:     Head: Normocephalic.     Nose: Nose normal.     Mouth/Throat:     Mouth: Mucous membranes are moist.     Pharynx: Oropharynx is clear.  Cardiovascular:     Rate and Rhythm: Normal rate.     Pulses: Normal  pulses.  Pulmonary:     Effort: Pulmonary effort is normal.  Abdominal:     Comments: C/o abdominal discomfort  Musculoskeletal:        General: Normal range of motion.     Cervical back: Normal range of motion.  Skin:    General: Skin is warm.  Neurological:     Mental Status: She is alert. Mental status is at baseline.  Psychiatric:        Attention and Perception: Attention and perception normal.        Mood and Affect: Mood normal. Affect is flat.        Speech: Speech normal.        Behavior: Behavior normal. Behavior is cooperative.        Thought Content: Thought content normal. Thought content is not paranoid or delusional. Thought content does not include homicidal or suicidal ideation. Thought content does not include homicidal or suicidal plan.        Cognition and Memory: Cognition and memory normal.        Judgment: Judgment normal.   Review of Systems  Gastrointestinal:  Positive for abdominal pain and diarrhea.  Psychiatric/Behavioral:  Positive for  substance abuse. Negative for hallucinations and suicidal ideas.   All other systems reviewed and are negative. Blood pressure 111/76, pulse 77, temperature 98.6 F (37 C), resp. rate 17, height 5\' 6"  (1.676 m), weight 59 kg, last menstrual period 12/09/2020, SpO2 100 %, currently breastfeeding. Body mass index is 20.98 kg/m.  Treatment Plan Summary: Daily contact with patient to assess and evaluate symptoms and progress in treatment, Medication management, and Plan to admit to inpatient unit for further observation, stabilization, and treatment.   Disposition: Recommend psychiatric Inpatient admission when medically cleared. Supportive therapy provided about ongoing stressors. Discussed crisis plan, support from social network, calling 911, coming to the Emergency Department, and calling Suicide Hotline.  12/11/2020, NP 12/11/2020 12:54 PM

## 2020-12-10 NOTE — BH Assessment (Signed)
[facility] Assessment Progress Note   Per Maxie Barb, NP, this voluntary pt requires psychiatric hospitalization at this time for detoxification.  Cathy, RN, [facility] reports that [facility] is unlikely to have beds available today.  At the direction of Earlene Plater, MD this writer has sought placement for pt outside of the [facility] system.  The following facilities have been contacted to seek placement for this pt, with results as noted:  Beds available, information sent, decision pending: [facility]   At capacity: [facility]  If this voluntary pt is accepted to a facility, please discuss disposition with pt to be sure that she agrees to the plan.  If a facility agrees to accept pt and the plan changes in any way please call the facility to inform them of the change.  Final disposition is pending as of this writing.  Doylene Canning, [place] [facility] Coordinator [phone]

## 2020-12-10 NOTE — Progress Notes (Signed)
Pt denied at [facility] due to facility unable to meet pt's needs with substance abuse. CSW and SW disposition team will continue to assist with placement.     Maryjean Ka, MSW, [facility] 12/10/2020 9:24 PM

## 2020-12-10 NOTE — BH Assessment (Signed)
Patient is requesting assistance with SA issues and detox from opiates to include heroin. Patient reports last use was on Tuesday when she used less than a half a gram although states she has been using daily up to that. Patient is also requesting to be evaluated for mental health medications for ongoing depression although would first like to first address SA issues. Patient has been recommended for ongoing inpatient treatment to assist with current SA issues. Patient is currently under review at several facilities to be considered for an admission.

## 2020-12-10 NOTE — ED Notes (Signed)
2 pt belongings bags moved from 19-22 nurses station to tcu cubby.

## 2020-12-10 NOTE — ED Provider Notes (Signed)
Emergency Medicine Observation Re-evaluation Note  [name] is a 30 y.o. female, seen on rounds today at 0730.  Pt initially presented to the ED for complaints of Drug Problem and Suicidal Currently, the patient is resting comfortably.  Physical Exam  BP 117/83   Pulse 66   Temp 98.4 F (36.9 C) (Oral)   Resp 19   Ht 5\' 6"  (1.676 m)   Wt 59 kg   LMP 12/09/2020   SpO2 99%   BMI 20.98 kg/m  Physical Exam General: NAD   ED Course / MDM  EKG:   I have reviewed the labs performed to date as well as medications administered while in observation.  Recent changes in the last 24 hours include no acute events reported.  Plan  Current plan is for placement.  12/11/2020 is not under involuntary commitment.     Flossie Buffy, MD 12/10/20 [phone]

## 2020-12-11 ENCOUNTER — Inpatient Hospital Stay (HOSPITAL_COMMUNITY)
Admission: AD | Admit: 2020-12-11 | Discharge: 2020-12-15 | DRG: 885 | Disposition: A | Discharge status: Home / Self Care | Payer: Federal, State, Local not specified - Other | Source: Intra-hospital | Attending: Emergency Medicine | Admitting: Emergency Medicine

## 2020-12-11 ENCOUNTER — Other Ambulatory Visit: Payer: Self-pay | Admitting: Psychiatry

## 2020-12-11 ENCOUNTER — Encounter (HOSPITAL_COMMUNITY): Payer: Self-pay | Admitting: Emergency Medicine

## 2020-12-11 ENCOUNTER — Other Ambulatory Visit: Payer: Self-pay

## 2020-12-11 DIAGNOSIS — G4700 Insomnia, unspecified: Secondary | ICD-10-CM | POA: Diagnosis present

## 2020-12-11 DIAGNOSIS — A539 Syphilis, unspecified: Secondary | ICD-10-CM

## 2020-12-11 DIAGNOSIS — F1123 Opioid dependence with withdrawal: Secondary | ICD-10-CM | POA: Diagnosis present

## 2020-12-11 DIAGNOSIS — F332 Major depressive disorder, recurrent severe without psychotic features: Principal | ICD-10-CM | POA: Diagnosis present

## 2020-12-11 DIAGNOSIS — F1190 Opioid use, unspecified, uncomplicated: Secondary | ICD-10-CM | POA: Diagnosis present

## 2020-12-11 DIAGNOSIS — F17210 Nicotine dependence, cigarettes, uncomplicated: Secondary | ICD-10-CM | POA: Diagnosis present

## 2020-12-11 MED ORDER — DICYCLOMINE HCL 10 MG PO CAPS
10.0000 mg | ORAL_CAPSULE | Freq: Four times a day (QID) | ORAL | Status: DC | PRN
  Administered 2020-12-12 – 2020-12-13 (×2): 10 mg via ORAL
  Filled 2020-12-11 (×2): qty 1

## 2020-12-11 MED ORDER — HYDROXYZINE HCL 25 MG PO TABS
25.0000 mg | ORAL_TABLET | Freq: Four times a day (QID) | ORAL | Status: DC | PRN
  Administered 2020-12-12 – 2020-12-15 (×7): 25 mg via ORAL
  Filled 2020-12-11 (×7): qty 1

## 2020-12-11 MED ORDER — METHOCARBAMOL 500 MG PO TABS
500.0000 mg | ORAL_TABLET | Freq: Three times a day (TID) | ORAL | Status: DC | PRN
  Administered 2020-12-11 – 2020-12-13 (×4): 500 mg via ORAL
  Filled 2020-12-11 (×4): qty 1

## 2020-12-11 MED ORDER — TRAZODONE HCL 50 MG PO TABS
50.0000 mg | ORAL_TABLET | Freq: Every evening | ORAL | Status: DC | PRN
  Administered 2020-12-11: 50 mg via ORAL
  Filled 2020-12-11: qty 1

## 2020-12-11 MED ORDER — LOPERAMIDE HCL 2 MG PO CAPS: 2.0000 mg | ORAL_CAPSULE | ORAL | Status: DC | PRN

## 2020-12-11 MED ORDER — ZIPRASIDONE MESYLATE 20 MG IM SOLR: 20.0000 mg | INTRAMUSCULAR | Status: DC | PRN

## 2020-12-11 MED ORDER — ONDANSETRON 4 MG PO TBDP
4.0000 mg | ORAL_TABLET | Freq: Four times a day (QID) | ORAL | Status: DC | PRN
  Administered 2020-12-11 – 2020-12-13 (×6): 4 mg via ORAL
  Filled 2020-12-11 (×6): qty 1

## 2020-12-11 MED ORDER — LOPERAMIDE HCL 2 MG PO CAPS
2.0000 mg | ORAL_CAPSULE | ORAL | Status: DC | PRN
  Administered 2020-12-12: 4 mg via ORAL
  Filled 2020-12-11: qty 2

## 2020-12-11 MED ORDER — OLANZAPINE 5 MG PO TBDP
5.0000 mg | ORAL_TABLET | Freq: Three times a day (TID) | ORAL | Status: DC | PRN
  Administered 2020-12-13: 5 mg via ORAL
  Filled 2020-12-11: qty 1

## 2020-12-11 MED ORDER — LORAZEPAM 1 MG PO TABS
1.0000 mg | ORAL_TABLET | ORAL | Status: AC | PRN
  Administered 2020-12-11: 1 mg via ORAL
  Filled 2020-12-11: qty 1

## 2020-12-11 MED ORDER — HYDROXYZINE HCL 25 MG PO TABS: 25.0000 mg | ORAL_TABLET | Freq: Three times a day (TID) | ORAL | Status: DC | PRN

## 2020-12-11 MED ORDER — NAPROXEN 500 MG PO TABS
500.0000 mg | ORAL_TABLET | Freq: Two times a day (BID) | ORAL | Status: DC | PRN
  Administered 2020-12-12 – 2020-12-13 (×3): 500 mg via ORAL
  Filled 2020-12-11 (×3): qty 1

## 2020-12-11 MED ORDER — NICOTINE POLACRILEX 2 MG MT GUM: 2.0000 mg | CHEWING_GUM | OROMUCOSAL | Status: DC | PRN

## 2020-12-11 NOTE — Progress Notes (Signed)
CSW provided the following resources as the provider's request listed below:   Substance Abuse Resources   [facility] - Admissions are currently completed Monday through Friday at 8am; both appointments and walk-ins are accepted.  Any individual that is a [facility] resident may present for a substance abuse screening and assessment for admission.  A person may be referred by numerous sources or self-refer.   Potential clients will be screened for medical necessity and appropriateness for the program.  Clients must meet criteria for high-intensity residential treatment services.  If clinically appropriate, a client will continue with the comprehensive clinical assessment and intake process, as well as enrollment in the [facility].  Address: [address] [place], [place] [address] Admin Hours: Mon-Fri 8AM to [facility] Center Hours: 24/7 Phone: [phone] Fax: [phone]  [facility] (Detox) Facility Based Crisis:  These are 3 locations for services: Please call before arrival:    [facility] Recovery Facility Based Crisis [facility])  Address: [address] [place], [place] [address] Phone: [phone]  [facility] Recovery Facility Based Crisis [facility]) Address: [address] [place], [place] [address] Phone#: [phone]  [facility] Recovery Facility Based Crisis [facility]) Address: [address] [place], [place] [address] Phone#: [phone]   [facility]): (offers outpatient therapy and intensive outpatient substance abuse therapy).  [address], [place], [place] [address] Phone: [phone]  [facility] Address: [address] [place], [place] [address] Phone: [phone]  -[facility] provides food, shelter and other programs and services to the homeless men of [place] through our [employer] program.  By offering safe shelter, three meals a day, clean  clothing, Biblical counseling, financial planning, vocational training, GED/education and employment assistance, we've helped mend the shattered lives of many homeless men since opening in [place].  We have approximately 267 beds available, with a max of 312 beds including mats for emergency situations and currently house an average of 270 men a night.  Prospective Client Check-In Information Photo ID Required (State/ Out of State/ [facility]) - if photo ID is not available, clients are required to have a printout of a police/sheriff's criminal history report. Help out with chores around the [facility]. No sex offender of any type (pending, charged, registered and/or any other sex related offenses) will be permitted to check in. Must be willing to abide by all rules, regulations, and policies established by the [employer]. The following will be provided - shelter, food, clothing, and biblical counseling. If you or someone you know is in need of assistance at our [facility] shelter in [place], [place], please call [phone] ext. [phone].  [facility] for [employer] hours are Monday-Friday only.   [facility]:   Phone: [phone]  The [facility] Intensive Outpatient Program [facility]) means structured individual and group addiction activities and services that are provided at an outpatient program designed to assist adult and adolescent consumers to begin recovery and learn skills for recovery maintenance. The [employer] SAIOP program is offered at least 3 hours a day, 3 days a week. SAIOP services shall include a structured program consisting of, but not limited to, the following services: Individual counseling and support; Group counseling and support; Family counseling, training or support; Biochemical assays to identify recent drug use (e.g., urine drug screens); Strategies for relapse prevention to include community and social support systems  in treatment; Life skills; Crisis  contingency planning; Disease Management; and Treatment support activities that have been adapted or specifically designed for persons with physical disabilities, or persons with co-occurring disorders of mental illness and substance abuse/dependence or mental retardation/developmental disability and substance abuse/dependence.  Phone: [phone]   [facility])  Address: [address] [place], [place], [place] [address] Phone: [phone]   [facility] Address: [address], [place], [place] [address] Phone: [phone]  - a combination of group and individual sessions to meet the participants needs. This allows participants to engage in treatment and remain involved in their home and work life. - Transitional housing places program participants in a supportive living environment while they complete a treatment program and work to secure independent housing. - The Substance Abuse Intensive Outpatient Treatment Program at [employer] consists of structured group sessions and individual sessions that are designed to teach participants early recovery and relapse prevention skills. -[facility] works with the [employer] to provide a housing and treatment program for homeless veterans.   [facility] [employer] [employer].   Address: [address] [place], [place] [address] Phone#: [phone]   : Referrals to RTSA facilities can be made by [employer] and [facility].  Referrals are also accepted from physicians, private providers, hospital emergency rooms, family members, or any person who has knowledge of someone in the need of our services.  The [facility] will also offer the following outpatient services: (Monday through Friday 8am-5pm)   Partial Hospitalization Program (PHP) Substance Abuse Intensive Outpatient Program (SA-IOP) Group Therapy Medication  Management Peer Living Room We also provide (24/7):  Assessments: Our mental health clinician and providers will conduct a focused mental health evaluation, assessing for immediate safety concerns and further mental health needs. Referral: Our team will provide resources and help connect to community based mental health treatment, when indicated, including psychotherapy, psychiatry, and other specialized behavioral health or substance use disorder services (for those not already in treatment). Transitional Care: Our team providers in person bridging and/or telephonic follow-up during the patient's transition to outpatient services.   The [facility] 24-Hour Call Center: [phone] Behavioral Health Crisis Line: [phone]  Crissie Reese, MSW, LCSW-A, [place] Phone: [phone] Disposition/TOC

## 2020-12-11 NOTE — BHH Suicide Risk Assessment (Addendum)
[facility] Admission Suicide Risk Assessment   Nursing information obtained from:  Patient Demographic factors:  Unemployed, Caucasian, Low socioeconomic status, homeless Current Mental Status:  Suicidal ideation indicated by patient, Self-harm thoughts, Belief that plan would result in death Loss Factors:  Financial problems / change in socioeconomic status Historical Factors:  Impulsivity, substance use prior to admission, previous psychiatric diagnoses/treatments, prior suicide attempts Risk Reduction Factors:  desire for detox/rehab  Total Time Spent in Direct Patient Care:  I personally spent 45 minutes on the unit in direct patient care. The direct patient care time included face-to-face time with the patient, reviewing the patient's chart, communicating with other professionals, and coordinating care. Greater than 50% of this time was spent in counseling or coordinating care with the patient regarding goals of hospitalization, psycho-education, and discharge planning needs.  Principal Problem: <principal problem not specified> Diagnosis:  Active Problems:   MDD (major depressive disorder), recurrent severe, without psychosis (HCC)   Opioid use disorder  Subjective Data: The patient is a 30y/o female with self-reported past psychiatric history significant for MDD vs bipolar d/o, ADD, polysubstance abuse, and PTSD who was admitted voluntarily as a walk-in to [facility] for help with worsening depression and SI. The patient admits that she only said she was suicidal in order to get help with detox from Heroin. She states she has been using IV Heroin off and on for the past 5 years but has most consistently been using 1gram IV daily since 2020. She states that prior to 2020 she was abusing methamphetamine but states she has not been using any alcohol or other illicit substances other than Heroin since 2020. She states she tried suboxone off the street but has never been in MAT program or on methadone. She  denies current signs of opiate withdrawal other than some chills. She states that recently she has felt depressed in the context of her drug use with associated insomnia, guilt about using, low energy, poor focus, and low appetite. She denies any AVH or paranoia at this time and states she only experienced this in the remote past while abusing methamphetamine. She reports 2 previous suicide attempts (intentional OD On Heroin in 2018 and OD on Xanax in an attempt to avoid going to jail and instead being medically admitted). She thinks she has been on Wellbutrin Prozac, Neurontin, and Zoloft in the past but had reported side-effects with Zoloft and Prozac. When questioned about previous manic episodes, she cannot recall a time when clean and sober where she had decreased need for sleep, burst of energy, grandiosity, spending in excess, racing thoughts, or impulsivity. She is unclear how she got a previous bipolar diagnosis. 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 She agrees to repeat HIV, Hepatitis and RPR testing at this time. See H&P for additional details.  Continued Clinical Symptoms:  Alcohol Use Disorder Identification Test Final Score (AUDIT): 0 The "Alcohol Use Disorders Identification Test", Guidelines for Use in Primary Care, Second Edition.  World [facility]). Score between 0-7:  no or low risk or alcohol related problems. Score between 8-15:  moderate risk of alcohol related problems. Score between 16-19:  high risk of alcohol related problems. Score 20 or above:  warrants further diagnostic evaluation for alcohol dependence and treatment.  CLINICAL FACTORS:   Depression:   Impulsivity Insomnia  Alcohol/Substance Abuse/Dependencies Previous Psychiatric Diagnoses and  Treatments   Musculoskeletal: Strength & Muscle Tone: within normal limits Gait & Station: normal Patient leans: N/A Psychiatric Specialty Exam: Physical Exam Vitals and nursing note reviewed.  HENT:     Head: Normocephalic.  Pulmonary:     Effort: Pulmonary effort is normal.  Neurological:     General: No focal deficit present.     Mental Status: She is alert.    Review of Systems - see H&P  Blood pressure (!) 149/100, pulse 90, temperature 98.7 F (37.1 C), resp. rate 18, height 5\' 6"  (1.676 m), weight 59.9 kg, last menstrual period 12/09/2020, SpO2 100 %, currently breastfeeding.Body mass index is 21.31 kg/m.  General Appearance:  casually dressed, fair hygiene  Eye Contact:  Good  Speech:  Clear and Coherent and Normal Rate  Volume:  Normal  Mood: described as depressed  Affect:  Constricted  Thought Process:  Goal Directed and Linear  Orientation:  Full (Time, Place, and Person)  Thought Content:  Logical and denies AVH, paranoia or delusions  Suicidal Thoughts:   Denied at this time - admits she made suicidal statements to be admitted for help with addiction issues  Homicidal Thoughts:   Denied  Memory:  Recent;   Good  Judgement:  Fair  Insight:  Fair  Psychomotor Activity:  Normal  Concentration:  Concentration: Good and Attention Span: Good  Recall:  Good  Fund of Knowledge:  Good  Language:  Good  Akathisia:  Negative  Assets:  Communication Skills Desire for Improvement Resilience  ADL's:  independent  Cognition:  WNL  Sleep:  Number of Hours: 6.5     COGNITIVE FEATURES THAT CONTRIBUTE TO RISK:  Thought constriction (tunnel vision)    SUICIDE RISK:   Mild given past attempts:  There are no identifiable plans, no associated intent, mild dysphoria and related symptoms,few other risk factors, and identifiable protective factors, including available and accessible social support.  PLAN OF CARE: Patient admitted voluntarily to [facility]. Admission labs  reviewed: TSH, Lipid panel and A1c pending; Respiratory panel negative; Beta HCG <5; Salicylate <7, Tylenol <10, CBC WNL, UDS positive for opiates; ETOH <10, CMP WNL except for BUN 24, Ca+ 8.8, AST 148, ALT 219 and gap 3. Will check PT/INR and Hepatic function panel given LFT elevation along with Hepatitis panel. She requests repeat HIV testing and will also recheck RPR titer.It is unclear if she has been compliant with antibiotic treatment for recent syphilis. Will restart Doxycycline 100mg  bid at this time pending RPR titer results. RPR titer 1:16 in September 2022.  Patient has been placed on COWS monitoring with PRNS available for possible opiate withdrawal. Patient agrees to start of Lexapro for depressive symptoms with r/b/se/a to med discussed in detail with patient. Will ask SW to see what residential rehab or SAIOP options are available for help with addictions issues. Presently patient wants to participate through the [facility] program with her probation officer for rehab options if available.   DX: MDD recurrent severe without psychosis (r/o substance induced depressive d/o) Opiate use d/o - severe I certify that inpatient services furnished can reasonably be expected to improve the patient's condition.   , MD, FAPA 12/12/2020, 1:25 PM

## 2020-12-11 NOTE — Progress Notes (Signed)
Per Rosey Bath, [facility], pt has been accepted to [facility] bed 306-1. Accepting provider is Leroy Sea, Attending provider is Dr. Mason Jim. Patient can arrive by 3:00pm. Number for report is [phone].   Crissie Reese, MSW, LCSW-A Phone: [phone] Disposition/TOC

## 2020-12-11 NOTE — ED Notes (Signed)
Report called to [facility] , states to send pt on

## 2020-12-11 NOTE — Progress Notes (Signed)
   12/11/20 1700  Psych Admission Type (Psych Patients Only)  Admission Status Voluntary  Psychosocial Assessment  Patient Complaints Substance abuse  Eye Contact Brief  Facial Expression Animated  Affect Anxious  Speech Logical/coherent  Interaction Assertive  Motor Activity Slow  Appearance/Hygiene Unremarkable  Behavior Characteristics Cooperative  Mood Depressed  Thought Process  Coherency WDL  Content WDL  Delusions None reported or observed  Perception WDL  Hallucination None reported or observed  Judgment Impaired  Confusion None  Danger to Self  Current suicidal ideation? Denies  Danger to Others  Danger to Others None reported or observed

## 2020-12-11 NOTE — Tx Team (Signed)
Initial Treatment Plan 12/11/2020 5:43 PM [name] HOO:[id number]    PATIENT STRESSORS: Financial difficulties   Occupational concerns   Substance abuse     PATIENT STRENGTHS: Ability for insight  Average or above average intelligence  Communication skills    PATIENT IDENTIFIED PROBLEMS: Risk for suicide   Heroin use  Homelessness  Lack of resources  "I'm tired of being a burden"  "I need a better attitude in life"           DISCHARGE CRITERIA:  Ability to meet basic life and health needs Motivation to continue treatment in a less acute level of care Withdrawal symptoms are absent or subacute and managed without 24-hour nursing intervention  PRELIMINARY DISCHARGE PLAN: Attend aftercare/continuing care group Attend 12-step recovery group Outpatient therapy  PATIENT/FAMILY INVOLVEMENT: This treatment plan has been presented to and reviewed with the patient, [name].  The patient and family have been given the opportunity to ask questions and make suggestions.  Cranford Mon, RN 12/11/2020, 5:43 PM

## 2020-12-11 NOTE — Progress Notes (Signed)
Patient ID: [name], female   DOB: October 10, 1990, 30 y.o.   MRN: [id number] Admission note: Patient is a 30 yo female admitted to [facility] voluntarily for heroin detox. Patient states she had a prior admission here. Her last use was last Thursday. She states she uses "one gram" per day. She states, "I've been using for the last 5 years." She feels hopelessness due to her living situation. She states she resides with a friend here in [place]. She is unemployed and does not have any supportive family in the area. She states the friend she lives with is supportive and asked her to come here to get help. She complains of some withdrawal symptoms "my skin crawling." She did have SI prior to admission; however, did not endorse any specific plan. She denies HI/AVH. Patient was taking doxycycline upon admission. Notified MD of patient's arrival.

## 2020-12-11 NOTE — ED Notes (Signed)
Transportation called and stated they would be here at 1430

## 2020-12-11 NOTE — Discharge Instructions (Signed)
Substance Abuse Resources   [facility] - Admissions are currently completed Monday through Friday at 8am; both appointments and walk-ins are accepted.  Any individual that is a [place] resident may present for a substance abuse screening and assessment for admission.  A person may be referred by numerous sources or self-refer.   Potential clients will be screened for medical necessity and appropriateness for the program.  Clients must meet criteria for high-intensity residential treatment services.  If clinically appropriate, a client will continue with the comprehensive clinical assessment and intake process, as well as enrollment in the [facility].  Address: [address] [place], [place] [address] Admin Hours: Mon-Fri 8AM to 5PM Center Hours: 24/7 Phone: [phone] Fax: [phone]  [facility] Facility Based Crisis:  These are 3 locations for services: Please call before arrival:    [facility] ([facility])  Address: [address] [place], [place] [address] Phone: [phone]  [facility] ([facility]) Address: [address], [place], [place] [address] Phone#: [phone]  [facility] ([facility] Address: [address], [place], [place] [address] Phone#: [phone]   [facility] (offers outpatient therapy and intensive outpatient substance abuse therapy).  [address], [place], [place] [address] Phone: [phone]  [facility] Address: [address] [place], [place] [address] Phone: [phone]  -[facility] provides food, shelter and other programs and services to the homeless men of [place] through our men's program.  By offering safe shelter, three meals a day, clean clothing, Biblical counseling, financial planning, vocational training, GED/education and  employment assistance, we've helped mend the shattered lives of many homeless men since opening in 1974.  We have approximately 267 beds available, with a max of 312 beds including mats for emergency situations and currently house an average of 270 men a night.  Prospective Client Check-In Information Photo ID Required (State/ Out of State/ DOC) - if photo ID is not available, clients are required to have a printout of a police/sheriff's criminal history report. Help out with chores around the Mission. No sex offender of any type (pending, charged, registered and/or any other sex related offenses) will be permitted to check in. Must be willing to abide by all rules, regulations, and policies established by the [facility]. The following will be provided - shelter, food, clothing, and biblical counseling. If you or someone you know is in need of assistance at our men's shelter in [place], [place], please call [phone] ext. [phone].  [facility] intake hours are Monday-Friday only.   [facility]:   Phone: [phone]  The [facility] SA Intensive Outpatient Program (SAIOP) means structured individual and group addiction activities and services that are provided at an outpatient program designed to assist adult and adolescent consumers to begin recovery and learn skills for recovery maintenance. The [facility] SAIOP program is offered at least 3 hours a day, 3 days a week. SAIOP services shall include a structured program consisting of, but not limited to, the following services: Individual counseling and support; Group counseling and support; Family counseling, training or support; Biochemical assays to identify recent drug use (e.g., urine drug screens); Strategies for relapse prevention to include community and social support systems in treatment; Life skills; Crisis contingency planning; Disease Management; and Treatment  support activities that have been adapted or   specifically designed for persons with physical disabilities, or persons with co-occurring disorders of mental illness and substance abuse/dependence or mental retardation/developmental disability and substance abuse/dependence. ? ?Phone: [phone]  ? ?[facility] ? ?Address: [address], [place], [place] [address] ?Phone: [phone] ? ? ?[facility] ?Address: [address], [place], [place] [address] ?Phone: [phone] ? ?- a combination of group and individual sessions to meet the participants needs. This allows participants to engage in treatment and remain involved in their home and work life. ?- Transitional housing places program participants in a supportive living environment while they complete a treatment program and work to secure independent housing. ?- The Substance Abuse Intensive Outpatient Treatment Program at [facility] consists of structured group sessions and individual sessions that are designed to teach participants early recovery and relapse prevention skills. ?-[facility] works with the [facility] to provide a housing and treatment program for homeless veterans.  ? ?[facility]  ? ?Address: [address] [place], [place] [address] ?Phone#: [phone]  ? ?: Referrals to RTSA facilities can be made by [facility] and [facility].  Referrals are also accepted from physicians, private providers, hospital emergency rooms, family members, or any person who has knowledge of someone in the need of our services. ? ?[facility] [place] [facility] will also offer the following outpatient services: (Monday through Friday 8am-5pm) ?  ?Partial Hospitalization Program (PHP) ?Substance Abuse Intensive Outpatient Program (SA-IOP) ?Group Therapy ?Medication Management ?Peer Living Room ?We also provide (24/7):  ?Assessments: Our mental health  clinician and providers will conduct a focused mental health evaluation, assessing for immediate safety concerns and further mental health needs. ?Referral: Our team will provide resources and help connect to community based mental health treatment, when indicated, including psychotherapy, psychiatry, and other specialized behavioral health or substance use disorder services (for those not already in treatment). ?Transitional Care: Our team providers in person bridging and/or telephonic follow-up during the patient's transition to outpatient services.  ? ?[facility] ?24-Hour Call Center: ?[phone] ?Behavioral Health Crisis Line: ?[phone] ? ?

## 2020-12-12 DIAGNOSIS — F1190 Opioid use, unspecified, uncomplicated: Secondary | ICD-10-CM | POA: Diagnosis present

## 2020-12-12 MED ORDER — ESCITALOPRAM OXALATE 5 MG PO TABS
5.0000 mg | ORAL_TABLET | Freq: Every day | ORAL | Status: DC
  Administered 2020-12-12 – 2020-12-15 (×4): 5 mg via ORAL
  Filled 2020-12-12 (×7): qty 1

## 2020-12-12 MED ORDER — TRAZODONE HCL 50 MG PO TABS
50.0000 mg | ORAL_TABLET | Freq: Once | ORAL | Status: AC
  Administered 2020-12-12: 50 mg via ORAL
  Filled 2020-12-12 (×2): qty 1

## 2020-12-12 MED ORDER — TRAZODONE HCL 50 MG PO TABS
50.0000 mg | ORAL_TABLET | Freq: Every day | ORAL | Status: DC
  Administered 2020-12-12 – 2020-12-13 (×2): 50 mg via ORAL
  Filled 2020-12-12 (×4): qty 1

## 2020-12-12 MED ORDER — DOXYCYCLINE HYCLATE 100 MG PO TABS
100.0000 mg | ORAL_TABLET | Freq: Two times a day (BID) | ORAL | Status: DC
  Administered 2020-12-12 – 2020-12-15 (×6): 100 mg via ORAL
  Filled 2020-12-12 (×11): qty 1

## 2020-12-12 MED ORDER — NICOTINE 14 MG/24HR TD PT24
14.0000 mg | MEDICATED_PATCH | Freq: Every day | TRANSDERMAL | Status: DC
  Administered 2020-12-12 – 2020-12-15 (×3): 14 mg via TRANSDERMAL
  Filled 2020-12-12 (×7): qty 1

## 2020-12-12 MED ORDER — FLUOXETINE HCL 10 MG PO CAPS
10.0000 mg | ORAL_CAPSULE | Freq: Every day | ORAL | Status: DC
  Administered 2020-12-12: 10 mg via ORAL
  Filled 2020-12-12 (×4): qty 1
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
## 2020-12-12 NOTE — Progress Notes (Signed)
[facility] Group Notes:  (Nursing/MHT/Case Management/Adjunct)  Date:  12/12/2020  Time:  12:15 AM  Type of Therapy:  Group Therapy  Participation Level:  Did Not Attend  Participation Quality: did not attend  Affect:   did not attend  Cognitive:   did not attend  Insight:  None  Engagement in Group:   did not attend  Modes of Intervention:   did not attend  Summary of Progress/Problems:  [name] 12/12/2020, 12:15 AM

## 2020-12-12 NOTE — [facility] Group Notes (Signed)
[facility] Group Notes:  (Nursing/MHT/Case Management/Adjunct)  Date:  12/12/2020  Time:  4:19 PM  Type of Therapy:  Music Therapy  Participation Level:  Active  Participation Quality:  Appropriate  Affect:  Appropriate  Cognitive:  Appropriate  Insight:  Appropriate  Engagement in Group:  Engaged  Modes of Intervention:  Activity  Summary of Progress/Problems:Patient was engaged in group today as evidence by singing several songs. Patient stated, "singing helps me to express myself."  Reymundo Poll 12/12/2020, 4:19 PM

## 2020-12-12 NOTE — [facility] Group Notes (Signed)
[facility] Group Notes:  (Nursing/MHT/Case Management/Adjunct)  Date:  12/12/2020  Time:  11:01 AM  Type of Therapy:  Group Therapy  Participation Level:  Active  Participation Quality:  Appropriate  Affect:  Appropriate  Cognitive:  Appropriate  Insight:  Good  Engagement in Group:  Engaged  Modes of Intervention:  Discussion  Summary of Progress/Problems: Patient was active and participated in group this AM.  Reymundo Poll 12/12/2020, 11:01 AM
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
## 2020-12-12 NOTE — Progress Notes (Signed)
Pt denies SI/HI/AVH and verbally agrees to approach staff if these become apparent or before harming themselves/others. Rates depression 0/10. Rates anxiety 8/10. Rates pain 7/10 for body aches. Pt stated that her mood was okay. Pt stated that she was experiencing all symptoms of withdrawal.  Pt has been in bed for a lot of the day but did attend morning group. Pt is seen laughing and engaging in conversation with other pts on the unit. Pt does not seem to be withdrawing as bad as stated by the pt. Last COW score was a 5. Scheduled medications administered to Pt, per MD orders. RN provided support and encouragement to Pt. Q15 min safety checks implemented and continued. Pt safe on the unit. RN will continue to monitor and intervene as needed.   12/12/20 0800  Psych Admission Type (Psych Patients Only)  Admission Status Voluntary  Psychosocial Assessment  Patient Complaints Anxiety;Substance abuse;Restlessness;Other (Comment) (pain)  Eye Contact Brief  Facial Expression Animated;Anxious  Affect Anxious  Speech Logical/coherent  Interaction Assertive  Motor Activity Fidgety  Appearance/Hygiene Unremarkable  Behavior Characteristics Cooperative;Anxious;Restless  Mood Anxious  Aggressive Behavior  Effect No apparent injury  Thought Process  Coherency WDL  Content WDL  Delusions None reported or observed  Perception WDL  Hallucination None reported or observed  Judgment Impaired  Confusion None  Danger to Self  Current suicidal ideation? Denies  Danger to Others  Danger to Others None reported or observed

## 2020-12-12 NOTE — [facility] Group Notes (Signed)
participated and contributed to wrap up group 

## 2020-12-12 NOTE — Group Note (Signed)
LCSW Group Therapy Note   Group Date: 12/12/2020 Start Time: 1000 End Time: 1100   Type of Therapy and Topic:  Group Therapy: Boundaries  Participation Level:  Did Not Attend  Description of Group: This group will address the use of boundaries in their personal lives. Patients will explore why boundaries are important, the difference between healthy and unhealthy boundaries, and negative and postive outcomes of different boundaries and will look at how boundaries can be crossed.  Patients will be encouraged to identify current boundaries in their own lives and identify what kind of boundary is being set. Facilitators will guide patients in utilizing problem-solving interventions to address and correct types boundaries being used and to address when no boundary is being used. Understanding and applying boundaries will be explored and addressed for obtaining and maintaining a balanced life. Patients will be encouraged to explore ways to assertively make their boundaries and needs known to significant others in their lives, using other group members and facilitator for role play, support, and feedback.  Therapeutic Goals:  1.  Patient will identify areas in their life where setting clear boundaries could be  used to improve their life.  2.  Patient will identify signs/triggers that a boundary is not being respected. 3.  Patient will identify two ways to set boundaries in order to achieve balance in  their lives: 4.  Patient will demonstrate ability to communicate their needs and set boundaries  through discussion and/or role plays  Summary of Patient Progress:  The patient was invited to group, did not participate.   Therapeutic Modalities:   Cognitive Behavioral Therapy Solution-Focused Therapy  Lynnell Chad, LCSWA 12/12/2020  3:10 PM

## 2020-12-12 NOTE — Progress Notes (Signed)
   12/11/20 2115  Psych Admission Type (Psych Patients Only)  Admission Status Voluntary  Psychosocial Assessment  Patient Complaints Anxiety;Substance abuse  Eye Contact Brief  Facial Expression Animated  Affect Anxious  Speech Logical/coherent  Interaction Assertive  Motor Activity Slow  Appearance/Hygiene Unremarkable  Behavior Characteristics Cooperative  Mood Other (Comment) ("Calm")  Thought Process  Coherency WDL  Content WDL  Delusions None reported or observed  Perception WDL  Hallucination None reported or observed  Judgment Impaired  Confusion None  Danger to Self  Current suicidal ideation? Denies  Danger to Others  Danger to Others None reported or observed   D: Pt alert and oriented. Pt rates both her anxiety and depression 5 at time of assessment. Pt rates headache pain 4/10 at this time. Pt denies experiencing any SI/HI, or AVH at this time.  Pt endorses mild nausea and upset  stomach, offered zofran 4mg  po prn at 2231 w/ some relief  reported during reassessment. Trazodone 50mg  po prn offered at 2231 for c/o insomnia, effective at reassessment.   A: Scheduled medications administered to pt, per MD orders. Support and encouragement provided. Routine safety checks conducted q15 minutes.   R: No adverse drug reactions noted. Pt verbally contracts for safety at this time. Pt complaint with medications. Pt interacted minimally with others on the unit, spent all shift in bedroom resting w/o any apparent distress noted/reported. Pt remains safe at this time. Will continue to monitor.

## 2020-12-13 ENCOUNTER — Encounter (HOSPITAL_COMMUNITY): Payer: Self-pay

## 2020-12-13 DIAGNOSIS — F332 Major depressive disorder, recurrent severe without psychotic features: Principal | ICD-10-CM

## 2020-12-13 LAB — LIPID PANEL
Cholesterol: 153 mg/dL (ref 0–200)
HDL: 54 mg/dL (ref >40)
LDL Cholesterol: 90 mg/dL (ref 0–99)
Total CHOL/HDL Ratio: 2.8 RATIO
Triglycerides: 44 mg/dL (ref <150)
VLDL: 9 mg/dL (ref 0–40)

## 2020-12-13 LAB — HEPATIC FUNCTION PANEL
ALT: 218 U/L — ABNORMAL HIGH (ref 0–44)
AST: 108 U/L — ABNORMAL HIGH (ref 15–41)
Albumin: 4.3 g/dL (ref 3.5–5.0)
Alkaline Phosphatase: 79 U/L (ref 38–126)
Bilirubin, Direct: 0.2 mg/dL (ref 0.0–0.2)
Indirect Bilirubin: 0.7 mg/dL (ref 0.3–0.9)
Total Bilirubin: 0.9 mg/dL (ref 0.3–1.2)
Total Protein: 8 g/dL (ref 6.5–8.1)

## 2020-12-13 LAB — PROTIME-INR
INR: 1.1 (ref 0.8–1.2)
Prothrombin Time: 14 seconds (ref 11.4–15.2)

## 2020-12-13 LAB — RPR
RPR Ser Ql: REACTIVE — AB
RPR Titer: 1:8 {titer}

## 2020-12-13 LAB — HEPATITIS PANEL, ACUTE
HCV Ab: REACTIVE — AB
Hep A IgM: NONREACTIVE
Hep B C IgM: NONREACTIVE
Hepatitis B Surface Ag: NONREACTIVE

## 2020-12-13 LAB — HIV ANTIBODY (ROUTINE TESTING W REFLEX): HIV Screen 4th Generation wRfx: NONREACTIVE

## 2020-12-13 LAB — HEMOGLOBIN A1C
Hgb A1c MFr Bld: 4.8 % (ref 4.8–5.6)
Mean Plasma Glucose: 91.06 mg/dL

## 2020-12-13 LAB — TSH: TSH: 0.945 u[IU]/mL (ref 0.350–4.500)

## 2020-12-13 NOTE — [facility] Suicide Risk Assessment (Signed)
[facility] INPATIENT:  Family/Significant Other Suicide Prevention Education  Suicide Prevention Education:  Education Completed;  friend [name] [phone],  (name of family member/significant other) has been identified by the patient as the family member/significant other with whom the patient will be residing, and identified as the person(s) who will aid the patient in the event of a mental health crisis (suicidal ideations/suicide attempt).  With written consent from the patient, the family member/significant other has been provided the following suicide prevention education, prior to the and/or following the discharge of the patient.  The suicide prevention education provided includes the following: Suicide risk factors Suicide prevention and interventions National Suicide Hotline telephone number [facility] assessment telephone number [facility] Emergency Assistance 911 [facility] and/or Residential Mobile Crisis Unit telephone number  Request made of family/significant other to: Remove weapons (e.g., guns, rifles, knives), all items previously/currently identified as safety concern.   Remove drugs/medications (over-the-counter, prescriptions, illicit drugs), all items previously/currently identified as a safety concern.  The family member/significant other verbalizes understanding of the suicide prevention education information provided.  The family member/significant other agrees to remove the items of safety concern listed above.  "She was on drugs and he probation officer told her to check herself in. "  -No weapons in home -Medications secured  Can return to his home at d/c  [name] 12/13/2020, 10:55 AM

## 2020-12-13 NOTE — Progress Notes (Signed)
Pt did not attend group. 

## 2020-12-13 NOTE — Plan of Care (Signed)
Nurse discussed copiing skills with patient.

## 2020-12-13 NOTE — Progress Notes (Addendum)
[facility] MD Progress Note  12/13/2020 1:22 PM [name]  MRN:  [id number]  Reason for Admission:  [name] is a 30 yr old female who presents with worsening depression and SI in the context of heroin abuse. PPHx is significant for ADHD, MDD, Polysubstance abuse, PTSD, Anxiety disorder and Bipolar disorder, two previous suicide attempts (Overdose), last hospitalization 4/20 [place].  Psychiatric team made the following recommendations yesterday: -Start Lexapro 5mg  -Start Withdrawal PRN's for opiate withdrawal -Start Doxycycline 100 mg BID for recent positive RPR while waiting on repeat RPR titer  Case was discussed in the multidisciplinary team. MAR was reviewed and patient was compliant with medications. Patient required Bentyl, Atarax, Loperamide, Robaxin, Naprosyn, and Zofran PRN's yesterday.  Information on Today's Interview:  On interview today patient reports that she is doing okay.  She reports that she was okay last night.  She reports that her appetite is okay.  She reports no SI, HI, or AVH. Initially when starting off the interview she reported that she wanted to be discharged and reiterated that she did not have any SI but only said that so that she could be admitted to the psych hospital for detox.  When asked about the report that she had been kissing another female patient yesterday she is ambivalent and vague stating "that was a mistake." She was informed that this is against policy and that she should not have any intimate contact with peers. She reported understanding.  When asked what withdrawal symptoms she was having she reports she had some chills and nausea but was otherwise doing okay.  Discussed with her that her liver enzymes were still elevated and that her A1c was WNL.She now reports a known h/o Hep C for which she has not been treated. Discussed with her that given her history of syphilis and hepatitis that she should not share needles, engage in any activities that could  cause blood sharing, or engage in sexual encounters without protection.When questioned about her syphilis treatment.  She reports that she received IM antibiotics when in the ED and was told to take 14 days of doxycycline afterwards.  She reports that she attempted to get her medicine filled and went to the health department to do so but she only got the doxycycline filled recently and was still taking the doxycycline when she was admitted to the hospital.  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  She stated that she had signed a 72-hour form.  However, after examining her paper chart with the nurse it was discovered that she had not signed a 72-hour form just signed the voluntary admission paperwork which told her that she could sign a 72-hour form.  Encouraged her to allow [place] another day or so to arrange her discharge and she was agreeable to this.  She had no other concerns at present.  Discussion with Consultant:  Spoke with Dr. Linus Salmons of Infectious Disease about her syphilis infection and previous treatment course.  He recommended 28 total days of Doxycycline 100mg  bid to ensure complete resolution of her Syphilis infection.   Principal Problem: MDD (major depressive disorder), recurrent severe, without psychosis ([place]) Diagnosis: Principal Problem:   MDD (major depressive disorder), recurrent severe, without psychosis ([place]) Active Problems:   Opioid use disorder  Total Time spent with patient: 30 minutes  Past Psychiatric History:  ADHD, MDD, Polysubstance abuse, PTSD, Anxiety disorder and  Bipolar disorder, two previous suicide attempts (Overdose), last hospitalization 4/20 [place].  Past Medical History:  Past Medical History:  Diagnosis Date   ADD (attention deficit disorder)    Anxiety    Bipolar 1 disorder ([place])    Depression    HA (headache)     Hepatitis    Hypertension    PTSD (post-traumatic stress disorder)    Status post tubal ligation 11/21/2015   Postpartum    Past Surgical History:  Procedure Laterality Date   MOUTH SURGERY     TUBAL LIGATION Bilateral 11/21/2015   Procedure: POST PARTUM TUBAL LIGATION;  Surgeon: Will Bonnet, MD;  Location: [facility] ORS;  Service: Gynecology;  Laterality: Bilateral;   Family History:  Family History  Problem Relation Age of Onset   Alcohol abuse Mother    Depression Mother    Heart attack Mother    Alcohol abuse Father    Diabetes Father    Family Psychiatric  History: Father: Bipolar Mother- Depression Brother - Addiction  Social History:  Social History   Substance and Sexual Activity  Alcohol Use Yes   Alcohol/week: 0.0 standard drinks   Comment: Patient endorses drinking occassionally. Amount unspecified      Social History   Substance and Sexual Activity  Drug Use Yes   Types: Methamphetamines, Heroin, Amphetamines, Marijuana, Cocaine, IV   Comment: heroin    Social History   Socioeconomic History   Marital status: Single    Spouse name: Not on file   Number of children: 2   Years of education: 10 th   Highest education level: Not on file  Occupational History   Not on file  Tobacco Use   Smoking status: Every Day    Packs/day: 0.50    Types: Cigarettes   Smokeless tobacco: Never  Substance and Sexual Activity   Alcohol use: Yes    Alcohol/week: 0.0 standard drinks    Comment: Patient endorses drinking occassionally. Amount unspecified    Drug use: Yes    Types: Methamphetamines, Heroin, Amphetamines, Marijuana, Cocaine, IV    Comment: heroin   Sexual activity: Yes    Birth control/protection: Condom, Surgical  Other Topics Concern   Not on file  Social History Narrative   Patient lives at home with friends and she is single.   Unemployed.   Education 10 th grade.   Right handed.   Caffeine Mountain dew and pepsi five or more cups daily.    Social Determinants of Health   Financial Resource Strain: Not on file  Food Insecurity: Not on file  Transportation Needs: Not on file  Physical Activity: Not on file  Stress: Not on file  Social Connections: Not on file   Sleep: Good  Appetite:  Good  Current Medications: Current Facility-Administered Medications  Medication Dose Route Frequency Provider Last Rate Last Admin   dicyclomine (BENTYL) capsule 10 mg  10 mg Oral QID PRN Leevy-Johnson, Brooke A, NP   10 mg at 12/13/20 1141   doxycycline (VIBRA-TABS) tablet 100 mg  100 mg Oral Q12H Tristin Vandeusen E, MD   100 mg at 12/13/20 0812   escitalopram (LEXAPRO) tablet 5 mg  5 mg Oral Daily Onuoha, Josephine C, NP   5 mg at 12/13/20 [id number]   hydrOXYzine (ATARAX/VISTARIL) tablet 25 mg  25 mg Oral Q6H PRN Viann Fish E, MD   25 mg at 12/13/20 1140   loperamide (IMODIUM) capsule 2-4 mg  2-4 mg Oral PRN Harlow Asa, MD  4 mg at 12/12/20 1129   methocarbamol (ROBAXIN) tablet 500 mg  500 mg Oral Q8H PRN Harlow Asa, MD   500 mg at 12/13/20 1141   naproxen (NAPROSYN) tablet 500 mg  500 mg Oral BID PRN Harlow Asa, MD   500 mg at 12/13/20 [id number]   nicotine (NICODERM CQ - dosed in mg/24 hours) patch 14 mg  14 mg Transdermal Daily Charmaine Downs C, NP   14 mg at 12/13/20 0814   OLANZapine zydis (ZYPREXA) disintegrating tablet 5 mg  5 mg Oral Q8H PRN Harlow Asa, MD       And   ziprasidone (GEODON) injection 20 mg  20 mg Intramuscular PRN Harlow Asa, MD       ondansetron (ZOFRAN-ODT) disintegrating tablet 4 mg  4 mg Oral Q6H PRN Harlow Asa, MD   4 mg at 12/13/20 [id number]   traZODone (DESYREL) tablet 50 mg  50 mg Oral QHS Charmaine Downs C, NP   50 mg at 12/12/20 2122    Lab Results:  Results for orders placed or performed during the hospital encounter of 12/11/20 (from the past 48 hour(s))  Hemoglobin A1c     Status: None   Collection Time: 12/13/20  6:33 AM  Result Value Ref Range   Hgb A1c MFr Bld 4.8  4.8 - 5.6 %    Comment: (NOTE) Pre diabetes:          5.7%-6.4%  Diabetes:              >6.4%  Glycemic control for   <7.0% adults with diabetes    Mean Plasma Glucose 91.06 mg/dL    Comment: Performed at [facility], [place] [address] [place], [place] [address]  Lipid panel     Status: None   Collection Time: 12/13/20  6:33 AM  Result Value Ref Range   Cholesterol 153 0 - 200 mg/dL   Triglycerides 44 <150 mg/dL   HDL 54 >40 mg/dL   Total CHOL/HDL Ratio 2.8 RATIO   VLDL 9 0 - 40 mg/dL   LDL Cholesterol 90 0 - 99 mg/dL    Comment:        Total Cholesterol/HDL:CHD Risk Coronary Heart Disease Risk Table                     Men   Women  1/2 Average Risk   3.4   3.3  Average Risk       5.0   4.4  2 X Average Risk   9.6   7.1  3 X Average Risk  23.4   11.0        Use the calculated Patient Ratio above and the CHD Risk Table to determine the patient's CHD Risk.        ATP III CLASSIFICATION (LDL):  <100     mg/dL   Optimal  100-129  mg/dL   Near or Above                    Optimal  130-159  mg/dL   Borderline  160-189  mg/dL   High  >190     mg/dL   Very High Performed at [facility] [address], [place], [place] [address]   TSH     Status: None   Collection Time: 12/13/20  6:33 AM  Result Value Ref Range   TSH 0.945 0.350 - 4.500 uIU/mL    Comment:  Performed by a 3rd Generation assay with a functional sensitivity of <=0.01 uIU/mL. Performed at [facility], [address] [place], [place] [address]   HIV Antibody (routine testing w rflx)     Status: None   Collection Time: 12/13/20  6:33 AM  Result Value Ref Range   HIV Screen 4th Generation wRfx Non Reactive Non Reactive    Comment: Performed at [facility], [address] [place], [place] [address]  RPR     Status: Abnormal   Collection Time: 12/13/20  6:33 AM  Result Value Ref Range   RPR Ser Ql Reactive (A) NON REACTIVE    Comment: SENT FOR CONFIRMATION    RPR Titer 1:8     Comment: Performed at [facility], [address] [place], [place] [address]  Hepatic function panel     Status: Abnormal   Collection Time: 12/13/20  6:33 AM  Result Value Ref Range   Total Protein 8.0 6.5 - 8.1 g/dL   Albumin 4.3 3.5 - 5.0 g/dL   AST 951108 (H) 15 - 41 U/L   ALT 218 (H) 0 - 44 U/L   Alkaline Phosphatase 79 38 - 126 U/L   Total Bilirubin 0.9 0.3 - 1.2 mg/dL   Bilirubin, Direct 0.2 0.0 - 0.2 mg/dL   Indirect Bilirubin 0.7 0.3 - 0.9 mg/dL    Comment: Performed at [facility], [address] [place], [place] [address]  Protime-INR     Status: None   Collection Time: 12/13/20  6:33 AM  Result Value Ref Range   Prothrombin Time 14.0 11.4 - 15.2 seconds   INR 1.1 0.8 - 1.2    Comment: (NOTE) INR goal varies based on device and disease states. Performed at [facility], [address] [place], [place] [address]     Blood Alcohol level:  Lab Results  Component Value Date   ETH <10 12/09/2020   ETH <10 05/30/2018    Metabolic Disorder Labs: Lab Results  Component Value Date   HGBA1C 4.8 12/13/2020   MPG 91.06 12/13/2020   No results found for: PROLACTIN Lab Results  Component Value Date   CHOL 153 12/13/2020   TRIG 44 12/13/2020   HDL 54 12/13/2020   CHOLHDL 2.8 12/13/2020   VLDL 9 12/13/2020   LDLCALC 90 12/13/2020    Physical Findings: AIMS: Facial and Oral Movements Muscles of Facial Expression: None, normal Lips and Perioral Area: None, normal Jaw: None, normal Tongue: None, normal,Extremity Movements Upper (arms, wrists, hands, fingers): None, normal Lower (legs, knees, ankles, toes): None, normal, Trunk Movements Neck, shoulders, hips: None, normal, Overall Severity Severity of abnormal movements (highest score from questions above): None, normal Incapacitation due to abnormal movements: None, normal Patient's awareness of abnormal movements (rate only patient's report): No  Awareness, Dental Status Current problems with teeth and/or dentures?: No Does patient usually wear dentures?: No  CIWA:    COWS:  COWS Total Score: 1  Musculoskeletal: Strength & Muscle Tone: within normal limits Gait & Station: normal Patient leans: N/A  Psychiatric Specialty Exam:  Presentation  General Appearance: Casually dressed, fair hygiene  Eye Contact:Good  Speech:Clear and Coherent; Normal Rate  Speech Volume:Normal  Handedness:Right   Mood and Affect  Mood:Anxious  Affect:Anxious   Thought Process  Thought Processes:Coherent and superficially goal directed  Descriptions of Associations:Intact  Orientation:Full (Time, Place and Person)  Thought Content:Logical - denies AVH or paranoia; no delusions noted  Hallucinations:Hallucinations: None  Ideas of Reference:None  Suicidal Thoughts:Suicidal Thoughts: No  Homicidal Thoughts:Homicidal Thoughts: No   Sensorium  Memory:Immediate Good; Recent Good  Judgment:Poor  Insight:Poor   Executive Functions  Concentration:Good  Attention Span:Good  [place] of Knowledge:Good  Language:Good   Psychomotor Activity  Psychomotor Activity:Psychomotor Activity: Normal   Assets  Assets:Communication Skills; Desire for Improvement; Resilience; Physical Health   Sleep  Sleep:Sleep: Good Number of Hours of Sleep: 6.75    Physical Exam: Physical Exam Vitals and nursing note reviewed.  Constitutional:      General: She is not in acute distress.    Appearance: Normal appearance. She is normal weight. She is ill-appearing. She is not toxic-appearing.  HENT:     Head: Normocephalic and atraumatic.  Pulmonary:     Effort: Pulmonary effort is normal.  Musculoskeletal:        General: Normal range of motion.  Neurological:     General: No focal deficit present.     Mental Status: She is alert.   Review of Systems  Constitutional:  Positive for chills.  Respiratory:  Negative  for cough and shortness of breath.   Cardiovascular:  Negative for chest pain.  Gastrointestinal:  Positive for nausea. Negative for constipation and vomiting.  Neurological:  Negative for weakness and headaches.  Psychiatric/Behavioral:  Negative for depression, hallucinations and suicidal ideas. The patient is not nervous/anxious.   Blood pressure 128/83, pulse 70, temperature 98.5 F (36.9 C), temperature source Oral, resp. rate 18, height 5\' 6"  (1.676 m), weight 59.9 kg, last menstrual period 12/09/2020, SpO2 99 %, currently breastfeeding. Body mass index is 21.31 kg/m.   Treatment Plan Summary: Daily contact with patient to assess and evaluate symptoms and progress in treatment and Medication management  [name] is a 30 yr old female who presents with worsening depression and SI in the context of opiate abuse. PPHx is significant for ADHD, MDD, Polysubstance abuse, PTSD, Anxiety disorder and Bipolar disorder, two previous suicide attempts (Overdose), last hospitalization 4/20 [place].  Patient did get lab work this morning and her HIV is negative while her RPR is reactive titer 1:8.  After discussion with I&D we will continue her on Doxycycline for a course of 28 total days. We are still waiting for her Hepatitis panel to result but since she reports a history of Hepatitis it will most likely be positive and would explain her elevated AST/ALT 108/218.  TSH is WNL at 0.945. Her Lipid Panel is WNL. Her A1c is 4.8.  She is asking for discharge.  We will attempt to get her appointments arranged and will aim for discharge in the next day or two.  We will not make any medication changes at this point.  We will continue to monitor.    MDD, Recurrent, Severe w/out Psychosis (r/o substance induced depressive d/o) -Continue Lexapro 5 mg daily - Continue Trazodone 50mg  qhs PRN insomnia - TSH WNL  Opiate use d/o:  - Continue PRNs for opiate withdrawal - Patient wants to work with [facility] after  discharge for rehab/SAIOP - Continue COWS monitoring (recent scores 5,4,2,1)  Nicotine Dependence: -Continue Nicotine Patch 14 mg daily  Transaminitis with self report of Positive Hep C: -Follow up with PCP at discharge for managemnt and treatment - Acute hepatitis panel pending but reports known h/o Hep C that has not been treated - In the event she has an active Hep C infection blood bourne precautions and safe sex practices were encouraged - PT/INR WNL; AST  108 and ALT 218 with remainder of hepatic function panel WNL  Syphilis: -Continue Doxycycline 100 mg BID (Day 1 of 28) per ID recommendations - T pallidum Ab total pending - Need to f/u with health department for additional titer once finishes antibiotics - Safe sex practices and need for treatment compliance to avoid secondary to tertiatry syphilis were discussed - HIV negative   -Continue PRN's: Tylenol, Maalox, Atarax, Milk of Magnesia, Trazodone   Lauro Franklin, MD 12/13/2020, 1:22 PM

## 2020-12-13 NOTE — Group Note (Signed)
Occupational Therapy Group Note  Group Topic:Feelings Management  Group Date: 12/13/2020 Start Time: 1400 End Time: 1445 Facilitators: Tayli Buch, OT/L    Group Description: Group encouraged increased participation and engagement through discussion focused on STRENGTHS. Patients were encouraged to fill out a worksheet to structure discussion, identifying ten strengths that they currently have. Patients were then instructed to utilize various symbols to identify which strengths help them in their relationships, school/profession, and leisure participation. Discussion also focused on identifying one strength they do not currently have but would like. Discussion within the group followed with patients sharing their responses and highlighting their own personal strengths.   Therapeutic Goals: Identify strengths vs weaknesses Discuss and identify ways we can highlight our strengths     Participation Level: Did not attend   Plan: Continue to engage patient in OT groups 2 - 3x/week.  12/13/2020  Davina Howlett, OT/L 

## 2020-12-13 NOTE — Progress Notes (Signed)
D:  Patient denied SI and HI, contracts for safety.  Denied A/V hallucinations.   A:  Medications administered per MD orders.  Emotional support and and  encouragement given patient. R:  Safety maintained with 1nute checks.

## 2020-12-13 NOTE — BHH Counselor (Signed)
Events affecting the discharge plan:  -Appointments for aftercare at [facility] for medication management and therapy need to be made.  - Pt needs PCP appt for Syphillis/Hep C per MD [place] Interventions by CSW:  -CSW contacted pt's friend and completed safety planning. Emotional response of the patient/family to the plan of care: -Pt's friend, Lars Mage voiced no safety concerns and stated pt can return at discharge to his home.   Fredirick Lathe, LCSWA Clinicial Social Worker [employer]

## 2020-12-13 NOTE — BHH Group Notes (Signed)
Pt attended and contributed to group 

## 2020-12-13 NOTE — Progress Notes (Signed)
[name] was up and visible on the unit.  She rated her day as 9/10 (10 the best).  She rated her depression 0/10 and anxiety 7/10 (10 the worst).  She was up at the medication window multiple times asking for "what can I have?"  This writer had to tease out what symptoms she was having.  She denied SI/HI or AVH.  She did get Robaxin prn for muscle cramping with good relief. 4:00am COWS not complete as she was asleep.  She is currently resting with her eyes closed and appears to be asleep.     12/12/20 2122  Psych Admission Type (Psych Patients Only)  Admission Status Voluntary  Psychosocial Assessment  Patient Complaints Anxiety;Insomnia;Substance abuse  Eye Contact Brief  Facial Expression Anxious  Affect Anxious  Speech Logical/coherent  Interaction Assertive  Motor Activity Fidgety  Appearance/Hygiene Disheveled  Behavior Characteristics Anxious;Irritable  Mood Anxious;Irritable  Thought Process  Coherency WDL  Content WDL  Delusions None reported or observed  Perception WDL  Hallucination None reported or observed  Judgment Impaired  Confusion None  Danger to Self  Current suicidal ideation? Denies  Danger to Others  Danger to Others None reported or observed

## 2020-12-13 NOTE — Group Note (Signed)
LCSW Group Therapy Note   Group Date: 12/13/2020 Start Time: 1300 End Time: 1345  LCSW Group Therapy Note  Type of Therapy and Topic:  Group Therapy:  Fear  Participation Level:  Did Not Attend  Description of Group: In this process group, patients discussed things that make feel them feel nervous or scared.  Patients discussed what they think about when they feel this emotions.  Patients were given the opportunity to share openly and support each others.  The group discussed where they feel their fears in their body.  Patients were encouraged to identify new coping mechanisms to use when fears arrise.  Therapeutic Goals Patient will verbalize fears and what thoughts occur when they feel this emotion. Patients will verbalize where on their body they feel fear. Patients will explore coping mechanisms they can use in the future when fear arises.   Summary of Patient Progress: Did Not Attend   Therapeutic Modalities Cognitive Behavioral Therapy Motivational Interviewing   Renn Stille M Sidonie Dexheimer, LCSWA 12/13/2020  1:51 PM    

## 2020-12-13 NOTE — BH IP Treatment Plan (Signed)
Interdisciplinary Treatment and Diagnostic Plan Update  12/13/2020 Time of Session: 9:20am [name] MRN: [id number]  Principal Diagnosis: MDD (major depressive disorder), recurrent severe, without psychosis ([name])  Secondary Diagnoses: Principal Problem:   MDD (major depressive disorder), recurrent severe, without psychosis ([name]) Active Problems:   Opioid use disorder   Current Medications:  Current Facility-Administered Medications  Medication Dose Route Frequency Provider Last Rate Last Admin   dicyclomine (BENTYL) capsule 10 mg  10 mg Oral QID PRN Leevy-Johnson, Brooke A, NP   10 mg at 12/12/20 0801   doxycycline (VIBRA-TABS) tablet 100 mg  100 mg Oral Q12H Singleton, Amy E, MD   100 mg at 12/13/20 0812   escitalopram (LEXAPRO) tablet 5 mg  5 mg Oral Daily Charmaine Downs C, NP   5 mg at 12/13/20 7425   hydrOXYzine (ATARAX/VISTARIL) tablet 25 mg  25 mg Oral Q6H PRN Harlow Asa, MD   25 mg at 12/12/20 2122   loperamide (IMODIUM) capsule 2-4 mg  2-4 mg Oral PRN Harlow Asa, MD   4 mg at 12/12/20 1129   methocarbamol (ROBAXIN) tablet 500 mg  500 mg Oral Q8H PRN Harlow Asa, MD   500 mg at 12/12/20 2058   naproxen (NAPROSYN) tablet 500 mg  500 mg Oral BID PRN Harlow Asa, MD   500 mg at 12/13/20 9563   nicotine (NICODERM CQ - dosed in mg/24 hours) patch 14 mg  14 mg Transdermal Daily Charmaine Downs C, NP   14 mg at 12/13/20 0814   OLANZapine zydis (ZYPREXA) disintegrating tablet 5 mg  5 mg Oral Q8H PRN Harlow Asa, MD       And   ziprasidone (GEODON) injection 20 mg  20 mg Intramuscular PRN Harlow Asa, MD       ondansetron (ZOFRAN-ODT) disintegrating tablet 4 mg  4 mg Oral Q6H PRN Harlow Asa, MD   4 mg at 12/13/20 8756   traZODone (DESYREL) tablet 50 mg  50 mg Oral QHS Charmaine Downs C, NP   50 mg at 12/12/20 2122   PTA Medications: Medications Prior to Admission  Medication Sig Dispense Refill Last Dose   aspirin EC 325 MG tablet  Take 325 mg by mouth every 4 (four) hours as needed for mild pain.      doxycycline (ADOXA) 100 MG tablet Take 100 mg by mouth 2 (two) times daily.      ibuprofen (ADVIL) 200 MG tablet Take 400 mg by mouth every 6 (six) hours as needed for mild pain.       Patient Stressors: Financial difficulties   Occupational concerns   Substance abuse    Patient Strengths: Ability for insight  Average or above average intelligence  Communication skills   Treatment Modalities: Medication Management, Group therapy, Case management,  1 to 1 session with clinician, Psychoeducation, Recreational therapy.   Physician Treatment Plan for Primary Diagnosis: MDD (major depressive disorder), recurrent severe, without psychosis ([name]) Long Term Goal(s): Improvement in symptoms so as ready for discharge   Short Term Goals: Ability to identify changes in lifestyle to reduce recurrence of condition will improve Ability to verbalize feelings will improve Ability to disclose and discuss suicidal ideas Ability to demonstrate self-control will improve Ability to identify and develop effective coping behaviors will improve Ability to maintain clinical measurements within normal limits will improve Compliance with prescribed medications will improve Ability to identify triggers associated with substance abuse/mental health issues will improve  Medication Management: Evaluate  patient's response, side effects, and tolerance of medication regimen.  Therapeutic Interventions: 1 to 1 sessions, Unit Group sessions and Medication administration.  Evaluation of Outcomes: Not Met  Physician Treatment Plan for Secondary Diagnosis: Principal Problem:   MDD (major depressive disorder), recurrent severe, without psychosis ([name]) Active Problems:   Opioid use disorder  Long Term Goal(s): Improvement in symptoms so as ready for discharge   Short Term Goals: Ability to identify changes in lifestyle to reduce recurrence of  condition will improve Ability to verbalize feelings will improve Ability to disclose and discuss suicidal ideas Ability to demonstrate self-control will improve Ability to identify and develop effective coping behaviors will improve Ability to maintain clinical measurements within normal limits will improve Compliance with prescribed medications will improve Ability to identify triggers associated with substance abuse/mental health issues will improve     Medication Management: Evaluate patient's response, side effects, and tolerance of medication regimen.  Therapeutic Interventions: 1 to 1 sessions, Unit Group sessions and Medication administration.  Evaluation of Outcomes: Not Met   RN Treatment Plan for Primary Diagnosis: MDD (major depressive disorder), recurrent severe, without psychosis ([name]) Long Term Goal(s): Knowledge of disease and therapeutic regimen to maintain health will improve  Short Term Goals: Ability to remain free from injury will improve, Ability to verbalize frustration and anger appropriately will improve, Ability to demonstrate self-control, Ability to identify and develop effective coping behaviors will improve, and Compliance with prescribed medications will improve  Medication Management: RN will administer medications as ordered by provider, will assess and evaluate patient's response and provide education to patient for prescribed medication. RN will report any adverse and/or side effects to prescribing provider.  Therapeutic Interventions: 1 on 1 counseling sessions, Psychoeducation, Medication administration, Evaluate responses to treatment, Monitor vital signs and CBGs as ordered, Perform/monitor CIWA, COWS, AIMS and Fall Risk screenings as ordered, Perform wound care treatments as ordered.  Evaluation of Outcomes: Not Met   LCSW Treatment Plan for Primary Diagnosis: MDD (major depressive disorder), recurrent severe, without psychosis ([place]) Long Term  Goal(s): Safe transition to appropriate next level of care at discharge, Engage patient in therapeutic group addressing interpersonal concerns.  Short Term Goals: Engage patient in aftercare planning with referrals and resources, Increase social support, Increase ability to appropriately verbalize feelings, Facilitate patient progression through stages of change regarding substance use diagnoses and concerns, Identify triggers associated with mental health/substance abuse issues, and Increase skills for wellness and recovery  Therapeutic Interventions: Assess for all discharge needs, 1 to 1 time with Social worker, Explore available resources and support systems, Assess for adequacy in community support network, Educate family and significant other(s) on suicide prevention, Complete Psychosocial Assessment, Interpersonal group therapy.  Evaluation of Outcomes: Not Met   Progress in Treatment: Attending groups: Yes. Participating in groups: Yes. Taking medication as prescribed: Yes. Toleration medication: Yes. Family/Significant other contact made: No, will contact:  friend Patient understands diagnosis: No. Discussing patient identified problems/goals with staff: Yes. Medical problems stabilized or resolved: Yes. Denies suicidal/homicidal ideation: Yes. Issues/concerns per patient self-inventory: No.   New problem(s) identified: No, Describe:  none  New Short Term/Long Term Goal(s): detox, medication management for mood stabilization; elimination of SI thoughts; development of comprehensive mental wellness/sobriety plan   Patient Goals:  "To get more sleep. I'm having nightmares"  Discharge Plan or Barriers: Patient is established with [facility] for therapy and medication management  Reason for Continuation of Hospitalization: Anxiety Depression Medication stabilization Suicidal ideation Withdrawal symptoms  Estimated Length of  Stay: 3-5 days   Scribe for Treatment  Team: Vassie Moselle, LCSW 12/13/2020 10:20 AM

## 2020-12-14 LAB — T.PALLIDUM AB, TOTAL: T Pallidum Abs: REACTIVE — AB

## 2020-12-14 MED ORDER — QUETIAPINE FUMARATE 50 MG PO TABS
50.0000 mg | ORAL_TABLET | Freq: Every day | ORAL | Status: DC
  Administered 2020-12-14: 50 mg via ORAL
  Filled 2020-12-14 (×3): qty 1

## 2020-12-14 NOTE — Group Note (Signed)
Recreation Therapy Group Note   Group Topic:Animal Assisted Therapy   Group Date: 12/14/2020 Start Time: 1430 End Time: 1500 Facilitators: Caroll Rancher, LRT/CTRS Location: [address]   AAA/T Program Assumption of Risk Form signed by Patient/ or Parent Legal Guardian Yes  Patient is free of allergies or severe asthma Yes  Patient reports no fear of animals Yes  Patient reports no history of cruelty to animals Yes  Patient understands his/her participation is voluntary Yes  Patient washes hands before animal contact Yes  Patient washes hands after animal contact Yes   Affect/Mood: Appropriate   Participation Level: Engaged   Participation Quality: Independent   Behavior: Appropriate   Speech/Thought Process: Focused   Insight: Good   Judgement: Good   Modes of Intervention: Teaching laboratory technician   Patient Response to Interventions:  Engaged   Education Outcome:  Acknowledges education and In group clarification offered    Clinical Observations/Individualized Feedback:  Pt attended and participated.  Pt engaged with therapy dog and asked questions.   Plan: Continue to engage patient in RT group sessions 2-3x/week.   Caroll Rancher, LRT/CTRS 12/14/2020 3:34 PM

## 2020-12-14 NOTE — Progress Notes (Signed)
   12/14/20 1634  Vital Signs  Pulse Rate 67  Pulse Rate Source Monitor  BP (!) 133/92  BP Location Right Arm  BP Method Automatic  Patient Position (if appropriate) Sitting  Oxygen Therapy  SpO2 99 %   D: Patient denies SI/HI/AVH. Patient rated anxiety 3/10 and denied depression. A:  Patient took scheduled medicine.  Support and encouragement provided Routine safety checks conducted every 15 minutes. Patient  Informed to notify staff with any concerns.   R:  Safety maintained.

## 2020-12-14 NOTE — BHH Group Notes (Signed)
Spiritual care group on grief and loss facilitated by chaplain Dyanne Carrel, [facility]   Group Goal:   Support / Education around grief and loss   Members engage in facilitated group support and psycho-social education.   Group Description:   Following introductions and group rules, group members engaged in facilitated group dialog and support around topic of loss, with particular support around experiences of loss in their lives. Group Identified types of loss (relationships / self / things) and identified patterns, circumstances, and changes that precipitate losses. Reflected on thoughts / feelings around loss, normalized grief responses, and recognized variety in grief experience. Group noted Worden's four tasks of grief in discussion.   Group drew on Adlerian / Rogerian, narrative, MI,   Patient Progress: [name] was in the group for part of the conversation.  Her participation was limited but she was engaged.  Chaplain Katy Vannie Hilgert, Bcc Pager, [phone] 7:45 PM

## 2020-12-14 NOTE — BHH Group Notes (Signed)
Pt did not attend goals group. 

## 2020-12-14 NOTE — BHH Group Notes (Signed)
BH Group Notes:  (Nursing/MT/Case Management/Adjunct)  Date:  12/14/2020  Time:  9:20 PM  Type of Therapy:  Psychoeducational Skills  Participation Level:  Active  Participation Quality:  Appropriate  Affect:  Appropriate  Cognitive:  Appropriate  Insight:  Good  Engagement in Group:  Engaged  Modes of Intervention:  Education  Summary of Progress/Problems: The patient's positive event for the day is that she has stopped "withdrawing". Her goal for tomorrow is to get discharged.   Hazle Coca S 12/14/2020, 9:20 PM

## 2020-12-14 NOTE — Progress Notes (Signed)
[facility] MD Progress Note  12/14/2020 10:54 AM [name]  MRN:  [id number]  Reason for Admission:  [name] is a 30 yr old female who presents with worsening depression and SI in the context of heroin abuse. PPHx is significant for ADHD, MDD, Polysubstance abuse, PTSD, Anxiety disorder and Bipolar disorder, two previous suicide attempts (Overdose), last hospitalization 4/20 [place].  Case was discussed in the multidisciplinary team. MAR was reviewed and patient was compliant with medications except nicotine patch. Patient required Vistaril, Bentyl, Robaxin, naproxen, Zyprexa, Zofran x 3 PRN's yesterday.  Information on Today's Interview:  On interview today patient reports that she is not doing good and complains of body ache, abdominal pain and anxiety.  She rates her depression at 0/10 on a scale of 1-10 with 10 being severe depression.  She states she had fever this morning at 100.7 with abdominal pain.  She states she was not able to sleep well last night because of abdominal pain.  She reports poor appetite and was not able to eat her breakfast this morning.  She reports dizziness.  She denies any nausea, vomiting, and headache.  She states she used to take Seroquel 200 mg at bedtime which really helped her with sleep but here they are giving her trazodone which is not helping.  She has been using her coping skills such as writing, coloring and thinking about herself.  She denies SI, HI, AVH.  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  She verbalizes understanding.She states that she would like to be discharged tomorrow. Pt was seen later in the day and she stated that her abdominal pain and nausea is better and she doesn't feel any pain.  Principal Problem: MDD (major depressive disorder), recurrent severe, without psychosis ([place]) Diagnosis: Principal Problem:   MDD (major depressive  disorder), recurrent severe, without psychosis ([place]) Active Problems:   Opioid use disorder  Total Time spent with patient: 30 minutes  Past Psychiatric History:  ADHD, MDD, Polysubstance abuse, PTSD, Anxiety disorder and Bipolar disorder, two previous suicide attempts (Overdose), last hospitalization 4/20 [place].  Past Medical History:  Past Medical History:  Diagnosis Date   ADD (attention deficit disorder)    Anxiety    Bipolar 1 disorder ([place])    Depression    HA (headache)    Hepatitis    Hypertension    PTSD (post-traumatic stress disorder)    Status post tubal ligation 11/21/2015   Postpartum    Past Surgical History:  Procedure Laterality Date   MOUTH SURGERY     TUBAL LIGATION Bilateral 11/21/2015   Procedure: POST PARTUM TUBAL LIGATION;  Surgeon: Will Bonnet, MD;  Location: [facility] ORS;  Service: Gynecology;  Laterality: Bilateral;   Family History:  Family History  Problem Relation Age of Onset   Alcohol abuse Mother    Depression Mother    Heart attack Mother    Alcohol abuse Father    Diabetes Father    Family Psychiatric  History: Father: Bipolar Mother- Depression Brother - Addiction  Social History:  Social History   Substance and Sexual Activity  Alcohol Use Yes   Alcohol/week: 0.0 standard drinks   Comment: Patient endorses drinking occassionally. Amount unspecified      Social History   Substance and Sexual Activity  Drug Use Yes   Types: Methamphetamines, Heroin, Amphetamines, Marijuana, Cocaine, IV   Comment: heroin    Social  History   Socioeconomic History   Marital status: Single    Spouse name: Not on file   Number of children: 2   Years of education: 10 th   Highest education level: Not on file  Occupational History   Not on file  Tobacco Use   Smoking status: Every Day    Packs/day: 0.50    Types: Cigarettes   Smokeless tobacco: Never  Substance and Sexual Activity   Alcohol use: Yes    Alcohol/week: 0.0 standard drinks     Comment: Patient endorses drinking occassionally. Amount unspecified    Drug use: Yes    Types: Methamphetamines, Heroin, Amphetamines, Marijuana, Cocaine, IV    Comment: heroin   Sexual activity: Yes    Birth control/protection: Condom, Surgical  Other Topics Concern   Not on file  Social History Narrative   Patient lives at home with friends and she is single.   Unemployed.   Education 10 th grade.   Right handed.   Caffeine Mountain dew and pepsi five or more cups daily.   Social Determinants of Health   Financial Resource Strain: Not on file  Food Insecurity: Not on file  Transportation Needs: Not on file  Physical Activity: Not on file  Stress: Not on file  Social Connections: Not on file   Sleep: Fair  Appetite:  Poor  Current Medications: Current Facility-Administered Medications  Medication Dose Route Frequency Provider Last Rate Last Admin   dicyclomine (BENTYL) capsule 10 mg  10 mg Oral QID PRN Leevy-Johnson, Brooke A, NP   10 mg at 12/13/20 1141   doxycycline (VIBRA-TABS) tablet 100 mg  100 mg Oral Q12H Della Goo, RPH   100 mg at 12/14/20 0839   escitalopram (LEXAPRO) tablet 5 mg  5 mg Oral Daily Dahlia Byes C, NP   5 mg at 12/14/20 0839   hydrOXYzine (ATARAX/VISTARIL) tablet 25 mg  25 mg Oral Q6H PRN Comer Locket, MD   25 mg at 12/14/20 0840   loperamide (IMODIUM) capsule 2-4 mg  2-4 mg Oral PRN Comer Locket, MD   4 mg at 12/12/20 1129   methocarbamol (ROBAXIN) tablet 500 mg  500 mg Oral Q8H PRN Bartholomew Crews E, MD   500 mg at 12/13/20 1141   naproxen (NAPROSYN) tablet 500 mg  500 mg Oral BID PRN Comer Locket, MD   500 mg at 12/13/20 7824   nicotine (NICODERM CQ - dosed in mg/24 hours) patch 14 mg  14 mg Transdermal Daily Dahlia Byes C, NP   14 mg at 12/13/20 0814   OLANZapine zydis (ZYPREXA) disintegrating tablet 5 mg  5 mg Oral Q8H PRN Bartholomew Crews E, MD   5 mg at 12/13/20 1446   And   ziprasidone (GEODON) injection 20 mg   20 mg Intramuscular PRN Comer Locket, MD       ondansetron (ZOFRAN-ODT) disintegrating tablet 4 mg  4 mg Oral Q6H PRN Bartholomew Crews E, MD   4 mg at 12/13/20 2121   traZODone (DESYREL) tablet 50 mg  50 mg Oral QHS Dahlia Byes C, NP   50 mg at 12/13/20 2121    Lab Results:  Results for orders placed or performed during the hospital encounter of 12/11/20 (from the past 48 hour(s))  Hemoglobin A1c     Status: None   Collection Time: 12/13/20  6:33 AM  Result Value Ref Range   Hgb A1c MFr Bld 4.8 4.8 - 5.6 %  Comment: (NOTE) Pre diabetes:          5.7%-6.4%  Diabetes:              >6.4%  Glycemic control for   <7.0% adults with diabetes    Mean Plasma Glucose 91.06 mg/dL    Comment: Performed at [facility] [address], [place], [place] [address]  Lipid panel     Status: None   Collection Time: 12/13/20  6:33 AM  Result Value Ref Range   Cholesterol 153 0 - 200 mg/dL   Triglycerides 44 <150 mg/dL   HDL 54 >40 mg/dL   Total CHOL/HDL Ratio 2.8 RATIO   VLDL 9 0 - 40 mg/dL   LDL Cholesterol 90 0 - 99 mg/dL    Comment:        Total Cholesterol/HDL:CHD Risk Coronary Heart Disease Risk Table                     Men   Women  1/2 Average Risk   3.4   3.3  Average Risk       5.0   4.4  2 X Average Risk   9.6   7.1  3 X Average Risk  23.4   11.0        Use the calculated Patient Ratio above and the CHD Risk Table to determine the patient's CHD Risk.        ATP III CLASSIFICATION (LDL):  <100     mg/dL   Optimal  100-129  mg/dL   Near or Above                    Optimal  130-159  mg/dL   Borderline  160-189  mg/dL   High  >190     mg/dL   Very High Performed at [facility] [address] [place], [place] [address]   TSH     Status: None   Collection Time: 12/13/20  6:33 AM  Result Value Ref Range   TSH 0.945 0.350 - 4.500 uIU/mL    Comment: Performed by a 3rd Generation assay with a functional sensitivity of <=0.01  uIU/mL. Performed at [facility], [place] [address] [place], [place] [address]   HIV Antibody (routine testing w rflx)     Status: None   Collection Time: 12/13/20  6:33 AM  Result Value Ref Range   HIV Screen 4th Generation wRfx Non Reactive Non Reactive    Comment: Performed at [facility], [place] [address], [place], [place] [address]  RPR     Status: Abnormal   Collection Time: 12/13/20  6:33 AM  Result Value Ref Range   RPR Ser Ql Reactive (A) NON REACTIVE    Comment: SENT FOR CONFIRMATION   RPR Titer 1:8     Comment: Performed at [facility], [place] [address] [place], [place] [address]  Hepatic function panel     Status: Abnormal   Collection Time: 12/13/20  6:33 AM  Result Value Ref Range   Total Protein 8.0 6.5 - 8.1 g/dL   Albumin 4.3 3.5 - 5.0 g/dL   AST 108 (H) 15 - 41 U/L   ALT 218 (H) 0 - 44 U/L   Alkaline Phosphatase 79 38 - 126 U/L   Total Bilirubin 0.9 0.3 - 1.2 mg/dL   Bilirubin, Direct 0.2 0.0 - 0.2 mg/dL   Indirect Bilirubin 0.7 0.3 - 0.9 mg/dL  Comment: Performed at [facility], [place] [address], [place], [place] [address]  Hepatitis panel, acute     Status: Abnormal   Collection Time: 12/13/20  6:33 AM  Result Value Ref Range   Hepatitis B Surface Ag NON REACTIVE NON REACTIVE   HCV Ab Reactive (A) NON REACTIVE    Comment: (NOTE) The CDC recommends that a Reactive HCV antibody result be followed up  with a HCV Nucleic Acid Amplification test.     Hep A IgM NON REACTIVE NON REACTIVE   Hep B C IgM NON REACTIVE NON REACTIVE    Comment: Performed at [facility], [place] [address] [place], [place] [address]  Protime-INR     Status: None   Collection Time: 12/13/20  6:33 AM  Result Value Ref Range   Prothrombin Time 14.0 11.4 - 15.2 seconds   INR 1.1 0.8 - 1.2    Comment: (NOTE) INR goal varies based on device and disease states. Performed at [facility], [place] [address] [place], [place] [address]     Blood Alcohol level:  Lab Results  Component Value Date   ETH <10 12/09/2020   ETH <10 [id number]    Metabolic Disorder Labs: Lab Results  Component Value Date   HGBA1C 4.8 12/13/2020   MPG 91.06 12/13/2020   No results found for: PROLACTIN Lab Results  Component Value Date   CHOL 153 12/13/2020   TRIG 44 12/13/2020   HDL 54 12/13/2020   CHOLHDL 2.8 12/13/2020   VLDL 9 12/13/2020   LDLCALC 90 12/13/2020    Physical Findings: AIMS: Facial and Oral Movements Muscles of Facial Expression: None, normal Lips and Perioral Area: None, normal Jaw: None, normal Tongue: None, normal,Extremity Movements Upper (arms, wrists, hands, fingers): None, normal Lower (legs, knees, ankles, toes): None, normal, Trunk Movements Neck, shoulders, hips: None, normal, Overall Severity Severity of abnormal movements (highest score from questions above): None, normal Incapacitation due to abnormal movements: None, normal Patient's awareness of abnormal movements (rate only patient's report): No Awareness, Dental Status Current problems with teeth and/or dentures?: No Does patient usually wear dentures?: No  CIWA:    COWS:  COWS Total Score: 2  Musculoskeletal: Strength & Muscle Tone: within normal limits Gait & Station: normal Patient leans: N/A  Psychiatric Specialty Exam:  Presentation  General Appearance: Casually dressed, fair hygiene  Eye Contact:Fair  Speech:Clear and Coherent; Normal Rate  Speech Volume:Normal  Handedness:Right   Mood and Affect  Mood:Anxious  Affect:Anxious   Thought Process  Thought Processes:Coherent  and superficially goal directed  Descriptions of Associations:Intact  Orientation:Full (Time, Place and Person)  Thought Content:Logical  - denies AVH or paranoia; no delusions noted   Hallucinations:Hallucinations: None  Ideas of Reference:None  Suicidal Thoughts:Suicidal Thoughts: No  Homicidal  Thoughts:Homicidal Thoughts: No   Sensorium  Memory:Immediate Good; Recent Good  Judgment:Poor  Insight:Poor   Executive Functions  Concentration:Good  Attention Span:Good  [place] of Knowledge:Good  Language:Good   Psychomotor Activity  Psychomotor Activity:Psychomotor Activity: Normal   Assets  Assets:Communication Skills; Desire for Improvement; Resilience; Physical Health   Sleep  Sleep:Sleep: Fair Number of Hours of Sleep: 6.75    Physical Exam: Physical Exam Vitals and nursing note reviewed.  Constitutional:      General: She is not in acute distress.    Appearance: Normal appearance. She is normal weight. She is ill-appearing. She is not toxic-appearing.  HENT:     Head: Normocephalic and atraumatic.  Pulmonary:     Effort: Pulmonary effort is normal.  Musculoskeletal:        General: Normal range of motion.  Neurological:     General: No focal deficit present.     Mental Status: She is alert.   Review of Systems  Respiratory:  Negative for cough and shortness of breath.   Cardiovascular:  Negative for chest pain.  Gastrointestinal:  Positive for abdominal pain and nausea. Negative for constipation and vomiting.  Neurological:  Positive for dizziness. Negative for weakness and headaches.  Psychiatric/Behavioral:  Negative for depression, hallucinations and suicidal ideas. The patient is nervous/anxious.   Blood pressure (!) 133/92, pulse 86, temperature 99 F (37.2 C), temperature source Oral, resp. rate 18, height 5\' 6"  (1.676 m), weight 59.9 kg, last menstrual period 12/09/2020, SpO2 98 %, currently breastfeeding. Body mass index is 21.31 kg/m.   Treatment Plan Summary: Daily contact with patient to assess and evaluate symptoms and progress in treatment and Medication management  Jillian Shanker is a 30 yr old female who presents with worsening depression and SI in the context of opiate abuse. PPHx is significant for ADHD, MDD,  Polysubstance abuse, PTSD, Anxiety disorder and Bipolar disorder, two previous suicide attempts (Overdose), last hospitalization 4/20 [place].  RPR reactive - ID recommended to continue her on Doxycycline for a course of 28 total days.  Patient would follow-up with ID on outpatient basis Hep C antibody reactive - patient has history of hep C which explain her elevated AST/ALT 108/218.   TSH is WNL at 0.945. Her Lipid Panel is WNL. Her A1c is 4.8.  MDD, Recurrent, Severe w/out Psychosis (r/o substance induced depressive d/o) -Continue Lexapro 5 mg daily - Discontinue Trazodone  - Start Seroquel 50 mg QHS for insomnia and anxiety.  - TSH WNL  Opiate use d/o:  - Continue PRNs for opiate withdrawal - Patient wants to work with [facility] after discharge for rehab/SAIOP - Continue COWS monitoring-most recent score 2 (GI upset)  Nicotine Dependence: -Continue Nicotine Patch 14 mg daily  Transaminitis with self report of Positive Hep C: -Follow up with PCP at discharge for managemnt and treatment - Acute hepatitis panel pending but reports known h/o Hep C that has not been treated - In the event she has an active Hep C infection blood bourne precautions and safe sex practices were encouraged - PT/INR WNL; AST 108 and ALT 218 with remainder of hepatic function panel WNL  Syphilis: -Continue Doxycycline 100 mg BID (Day 2 of 28) per ID recommendations - T pallidum Ab total pending - Need to f/u with health department for additional titer once finishes antibiotics - Safe sex practices and need for treatment compliance to 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 Armando Reichert, MD 12/14/2020, 10:54 AM

## 2020-12-14 NOTE — Progress Notes (Signed)
Pt is visible in the milieu interacting with her peers appropriately. She denied SI/HI/AVH or self harm intent. She denied w/d's other than nausea and she was medicated with PRN Zofran with good effect. She attended group and ate her HS snacks with no falls or unsafe behavior noted thus far. Unable to complete COWS assessment at midnight as Pt was in bed resting with + even and unlabored respirations in no apparent distress. Q15 min observations maintained for safety and support provided as needed.   12/13/20 2200  Psych Admission Type (Psych Patients Only)  Admission Status Voluntary  Psychosocial Assessment  Patient Complaints Anxiety;Substance abuse (Nausea tx with Zofran with good effect.)  Eye Contact Brief  Facial Expression Animated  Affect Anxious  Speech Logical/coherent  Interaction Assertive  Motor Activity Fidgety  Appearance/Hygiene Disheveled  Behavior Characteristics Anxious  Mood Euthymic  Thought Process  Coherency WDL  Content WDL  Delusions None reported or observed  Perception WDL  Hallucination None reported or observed  Judgment Poor  Confusion None  Danger to Self  Current suicidal ideation? Denies  Danger to Others  Danger to Others None reported or observed

## 2020-12-15 ENCOUNTER — Other Ambulatory Visit (HOSPITAL_COMMUNITY): Payer: Self-pay

## 2020-12-15 MED ORDER — QUETIAPINE FUMARATE 50 MG PO TABS
50.0000 mg | ORAL_TABLET | Freq: Every day | ORAL | 0 refills | Status: AC
  Filled 2020-12-15: qty 30, 30d supply, fill #0

## 2020-12-15 MED ORDER — NICOTINE 14 MG/24HR TD PT24
14.0000 mg | MEDICATED_PATCH | Freq: Every day | TRANSDERMAL | 0 refills | Status: DC
  Filled 2020-12-15: qty 28, 28d supply, fill #0

## 2020-12-15 MED ORDER — DOXYCYCLINE HYCLATE 100 MG PO TABS
100.0000 mg | ORAL_TABLET | Freq: Two times a day (BID) | ORAL | 0 refills | Status: AC
  Filled 2020-12-15 – 2021-01-20 (×2): qty 48, 24d supply, fill #0

## 2020-12-15 MED ORDER — HYDROXYZINE HCL 25 MG PO TABS
25.0000 mg | ORAL_TABLET | Freq: Three times a day (TID) | ORAL | 0 refills | Status: AC | PRN
  Filled 2020-12-15: qty 30, 10d supply, fill #0

## 2020-12-15 MED ORDER — ESCITALOPRAM OXALATE 5 MG PO TABS
5.0000 mg | ORAL_TABLET | Freq: Every day | ORAL | 0 refills | Status: DC
  Filled 2020-12-15: qty 30, 30d supply, fill #0

## 2020-12-15 NOTE — Discharge Summary (Signed)
Physician Discharge Summary Note  Patient:  [name] is an 30 y.o., female MRN:  [id number] DOB:  1990-06-07 Patient phone:  [phone] (home)  Patient address:   [place] [address],  Total Time spent with patient: 30 minutes  Date of Admission:  12/11/2020 Date of Discharge: 12/15/20  Reason for Admission:  Miss Mongar is a 30 yr old female who presents with worsening depression and SI in the context of heroin abuse. PPHx is significant for ADHD, MDD, Polysubstance abuse, PTSD, Anxiety disorder and Bipolar disorder, two previous suicide attempts (Overdose), last hospitalization 4/20 [place].  Principal Problem: MDD (major depressive disorder), recurrent severe, without psychosis ([place]) Discharge Diagnoses: Principal Problem:   MDD (major depressive disorder), recurrent severe, without psychosis ([place]) Active Problems:   Opioid use disorder   Past Psychiatric History: ADHD, MDD, Polysubstance abuse, PTSD, Anxiety disorder and Bipolar disorder, two previous suicide attempts (Overdose), last hospitalization 4/20 [place].  Past Medical History:  Past Medical History:  Diagnosis Date   ADD (attention deficit disorder)    Anxiety    Bipolar 1 disorder ([place])    Depression    HA (headache)    Hepatitis    Hypertension    PTSD (post-traumatic stress disorder)    Status post tubal ligation 11/21/2015   Postpartum    Past Surgical History:  Procedure Laterality Date   MOUTH SURGERY     TUBAL LIGATION Bilateral 11/21/2015   Procedure: POST PARTUM TUBAL LIGATION;  Surgeon: Will Bonnet, MD;  Location: [facility] ORS;  Service: Gynecology;  Laterality: Bilateral;   Family History:  Family History  Problem Relation Age of Onset   Alcohol abuse Mother    Depression Mother    Heart attack Mother    Alcohol abuse Father    Diabetes Father    Family Psychiatric  History: Father: Bipolar Mother- Depression Brother - Addiction Social History:   Social History   Substance and Sexual Activity  Alcohol Use Yes   Alcohol/week: 0.0 standard drinks   Comment: Patient endorses drinking occassionally. Amount unspecified      Social History   Substance and Sexual Activity  Drug Use Yes   Types: Methamphetamines, Heroin, Amphetamines, Marijuana, Cocaine, IV   Comment: heroin    Social History   Socioeconomic History   Marital status: Single    Spouse name: Not on file   Number of children: 2   Years of education: 10 th   Highest education level: Not on file  Occupational History   Not on file  Tobacco Use   Smoking status: Every Day    Packs/day: 0.50    Types: Cigarettes   Smokeless tobacco: Never  Substance and Sexual Activity   Alcohol use: Yes    Alcohol/week: 0.0 standard drinks    Comment: Patient endorses drinking occassionally. Amount unspecified    Drug use: Yes    Types: Methamphetamines, Heroin, Amphetamines, Marijuana, Cocaine, IV    Comment: heroin   Sexual activity: Yes    Birth control/protection: Condom, Surgical  Other Topics Concern   Not on file  Social History Narrative   Patient lives at home with friends and she is single.   Unemployed.   Education 10 th grade.   Right handed.   Caffeine Mountain dew and pepsi five or more cups daily.   Social Determinants of Health   Financial Resource Strain: Not on file  Food Insecurity: Not on file  Transportation Needs: Not on file  Physical Activity: Not on file  Stress: Not on file  Social Connections: Not on file    Hospital Course:  On admission patient was integrated into therapeutic milieu and placed on appropriate precautions.  Pt was started on Lexapro 5 mg daily for depression and Seroquel 50 mg daily at bedtime for insomnia and depression.  She was started on COWS protocol for opiate withdrawal symptoms and offered as needed medications.  Patient was also given nicotine patch for nicotine dependence. Patient was found reactive for RPR  with positive T pallidum antibodies and RPR titer of 1:8.  After consulting with ID, patient was started on doxycycline 100 mg twice daily for a total of 28 days.  Patient is recommended to follow-up with ID after discharge. Her AST 108 and ALT 218 with positive hep C antibodies which was already known to patient.  She is recommended to follow-up with PCP and ID after discharge.    Pt's mood, sleep, affect and anxiety improved while her time at the inpatient unit.Pt was seen daily on rounds, and case discussed at multidisciplinary team meeting to ensure biopsychosocial approach to treatment. Pt was educated on need for compliance with his medications. Pt was also educated on need to abstain from use of any drugs.   During her stay Patient did not display any dangerous, violent or suicidal behavior on the unit.  Pt interacted with patients & staff appropriately, participated appropriately in the group sessions/therapies. Pt's medications were addressed & adjusted to meet her needs. Pt was recommended for outpatient follow-up care & medication management upon discharge to assure her continuity of care.   At the time of discharge, patient is not reporting any acute suicidal/homicidal ideations. Pt reports improved mood, sleep and anxiety. Pt currently denies any new issues or concerns. Education and supportive counseling provided throughout her hospital stay & upon discharge. Patient is recommended to follow-up with PCP and infectious disease after discharge for syphilis and hep C antibodies.  Ambulatory referral to ID ordered.  Physical Findings: AIMS: Facial and Oral Movements Muscles of Facial Expression: None, normal Lips and Perioral Area: None, normal Jaw: None, normal Tongue: None, normal,Extremity Movements Upper (arms, wrists, hands, fingers): None, normal Lower (legs, knees, ankles, toes): None, normal, Trunk Movements Neck, shoulders, hips: None, normal, Overall Severity Severity of abnormal  movements (highest score from questions above): None, normal Incapacitation due to abnormal movements: None, normal Patient's awareness of abnormal movements (rate only patient's report): No Awareness, Dental Status Current problems with teeth and/or dentures?: No Does patient usually wear dentures?: No  CIWA:    COWS:  COWS Total Score: 1  Musculoskeletal: Strength & Muscle Tone: within normal limits Gait & Station: normal Patient leans: N/A   Psychiatric Specialty Exam:  Presentation  General Appearance: Appropriate for Environment  Eye Contact:Fair  Speech:Clear and Coherent; Normal Rate  Speech Volume:Normal  Handedness:Right   Mood and Affect  Mood:Anxious  Affect:Congruent; Appropriate   Thought Process  Thought Processes:Coherent  Descriptions of Associations:Intact  Orientation:Full (Time, Place and Person)  Thought Content:Logical  History of Schizophrenia/Schizoaffective disorder:No data recorded Duration of Psychotic Symptoms:No data recorded Hallucinations:Hallucinations: None  Ideas of Reference:None  Suicidal Thoughts:Suicidal Thoughts: No  Homicidal Thoughts:Homicidal Thoughts: No   Sensorium  Memory:Immediate Good; Recent Good  Judgment:Poor  Insight:Poor   Executive Functions  Concentration:Good  Attention Span:Good  [place] of Knowledge:Good  Language:Good   Psychomotor Activity  Psychomotor Activity:Psychomotor Activity: Normal   Assets  Assets:Communication Skills; Desire for Improvement; Resilience; Physical  Health   Sleep  Sleep:Sleep: Fair Number of Hours of Sleep: 6.75    Physical Exam: Physical Exam Vitals and nursing note reviewed.  Constitutional:      General: She is not in acute distress.    Appearance: Normal appearance. She is not ill-appearing, toxic-appearing or diaphoretic.  HENT:     Head: Normocephalic and atraumatic.  Pulmonary:     Effort: Pulmonary effort is normal.   Musculoskeletal:        General: Normal range of motion.  Neurological:     General: No focal deficit present.     Mental Status: She is alert and oriented to person, place, and time.   Review of Systems  Constitutional:  Negative for chills and fever.  HENT:  Negative for hearing loss.   Eyes:  Negative for blurred vision.  Respiratory:  Negative for cough and shortness of breath.   Cardiovascular:  Negative for chest pain.  Gastrointestinal:  Negative for abdominal pain, nausea and vomiting.  Musculoskeletal:  Negative for myalgias.  Neurological:  Negative for dizziness, weakness and headaches.  Psychiatric/Behavioral:  Negative for depression, hallucinations and suicidal ideas. The patient is not nervous/anxious.   Blood pressure (!) 141/102, pulse (!) 102, temperature 98.9 F (37.2 C), temperature source Oral, resp. rate 18, height 5\' 6"  (1.676 m), weight 59.9 kg, last menstrual period 12/09/2020, SpO2 98 %, currently breastfeeding. Body mass index is 21.31 kg/m.   Social History   Tobacco Use  Smoking Status Every Day   Packs/day: 0.50   Types: Cigarettes  Smokeless Tobacco Never   Tobacco Cessation:  A prescription for an FDA-approved tobacco cessation medication provided at discharge   Blood Alcohol level:  Lab Results  Component Value Date   [facility] <10 12/09/2020   ETH <10 05/30/2018    Metabolic Disorder Labs:  Lab Results  Component Value Date   HGBA1C 4.8 12/13/2020   MPG 91.06 12/13/2020   No results found for: PROLACTIN Lab Results  Component Value Date   CHOL 153 12/13/2020   TRIG 44 12/13/2020   HDL 54 12/13/2020   CHOLHDL 2.8 12/13/2020   VLDL 9 12/13/2020   LDLCALC 90 12/13/2020    See Psychiatric Specialty Exam and Suicide Risk Assessment completed by Attending Physician prior to discharge.  Discharge destination:  Home  Is patient on multiple antipsychotic therapies at discharge:  No   Has Patient had three or more failed trials of  antipsychotic monotherapy by history:  No  Recommended Plan for Multiple Antipsychotic Therapies: NA  Discharge Instructions     Ambulatory referral to Infectious Disease   Complete by: As directed    For Syphillis   Diet - low sodium heart healthy   Complete by: As directed    Increase activity slowly   Complete by: As directed       Allergies as of 12/15/2020   No Known Allergies      Medication List     STOP taking these medications    aspirin EC 325 MG tablet   doxycycline 100 MG tablet Commonly known as: ADOXA       TAKE these medications      Indication  doxycycline 100 MG tablet Commonly known as: VIBRA-TABS Take 1 tablet (100 mg total) by mouth every 12 (twelve) hours for 24 days.  Indication: Syphillis   escitalopram 5 MG tablet Commonly known as: LEXAPRO Take 1 tablet (5 mg total) by mouth daily. Start taking on: December 16, 2020  Indication: Major  Depressive Disorder   hydrOXYzine 25 MG tablet Commonly known as: ATARAX/VISTARIL Take 1 tablet (25 mg total) by mouth 3 (three) times daily as needed for anxiety.  Indication: Feeling Anxious   ibuprofen 200 MG tablet Commonly known as: ADVIL Take 400 mg by mouth every 6 (six) hours as needed for mild pain.  Indication: Headache, Pain   nicotine 14 mg/24hr patch Commonly known as: NICODERM CQ - dosed in mg/24 hours Place 1 patch (14 mg total) onto the skin daily. Start taking on: December 16, 2020  Indication: Nicotine Addiction   QUEtiapine 50 MG tablet Commonly known as: SEROQUEL Take 1 tablet (50 mg total) by mouth at bedtime.  Indication: Major Depressive Disorder        Follow-up Information     [facility]. Go on 01/05/2021.   Why: You have an appointment to establish care for primary care services on  01/05/21 at 9:15 am. This appointment will be held in person. Contact information: [place]  [phone]        [facility]. Call.   Why: Please contact your care manager Orlan Leavens for follow up services. Contact information: [place], [place], [place] [address]  Phone: [phone]        [facility]. Call.   Specialty: Behavioral Health Why: Please go to this provider for therapy and medication management services. You may call to schedule an appointment or go during walk in hours:  Monday to Wednesday from 7:45 am to 11 am. Walk in services are first come, first served. Contact information: [place] [phone]                Follow-up recommendations:  Activity:  As Tolerated Diet:  Regular Pt is recommended to follow-up with infectious disease and PCP for syphilis and hep C.  Ambulatory referral to ID ordered.  Comments:  Prescriptions sent to pharmacy at discharge. Patient agreeable to plan.  Given opportunity to ask questions.  Appears to feel comfortable with discharge denies any current suicidal or homicidal thought. Patient is also instructed prior to discharge to: Take all medications as prescribed by her mental healthcare provider. Report any adverse effects and or reactions from the medicines to her outpatient provider promptly. Patient has been instructed & cautioned: To not engage in alcohol and or illegal drug use while on prescription medicines. In the event of worsening symptoms, patient is instructed to call the crisis hotline, 911 and or go to the nearest ED for appropriate evaluation and treatment of symptoms. To follow-up with his/her primary care provider for your other medical issues, concerns and or health care needs.   Signed: Armando Reichert, MD 12/15/2020, 10:42 AM

## 2020-12-15 NOTE — Progress Notes (Signed)
   12/14/20 2200  Psych Admission Type (Psych Patients Only)  Admission Status Voluntary  Psychosocial Assessment  Patient Complaints Anxiety  Eye Contact Brief  Facial Expression Animated  Affect Appropriate to circumstance  Speech Logical/coherent  Interaction Assertive  Motor Activity Other (Comment) (Unremarkable)  Appearance/Hygiene Disheveled  Behavior Characteristics Cooperative;Appropriate to situation  Mood Pleasant;Euthymic  Thought Process  Coherency WDL  Content WDL  Delusions None reported or observed  Perception WDL  Hallucination None reported or observed  Judgment Poor  Confusion None  Danger to Self  Current suicidal ideation? Denies  Danger to Others  Danger to Others None reported or observed

## 2020-12-15 NOTE — BHH Suicide Risk Assessment (Signed)
[facility] Discharge Suicide Risk Assessment   Principal Problem: MDD (major depressive disorder), recurrent severe, without psychosis ([place]) Discharge Diagnoses: Principal Problem:   MDD (major depressive disorder), recurrent severe, without psychosis ([place]) Active Problems:   Opioid use disorder   Total Time spent with patient: 15 minutes  Musculoskeletal: Strength & Muscle Tone: within normal limits Gait & Station: normal Patient leans: N/A  Psychiatric Specialty Exam  Presentation  General Appearance: Appropriate for Environment  Eye Contact:Good  Speech:Clear and Coherent  Speech Volume:Normal  Handedness:Right   Mood and Affect  Mood:Euthymic  Duration of Depression Symptoms: Greater than two weeks  Affect:Congruent; Appropriate   Thought Process  Thought Processes:Coherent  Descriptions of Associations:Intact  Orientation:Full (Time, Place and Person)  Thought Content:Logical  History of Schizophrenia/Schizoaffective disorder:No data recorded Duration of Psychotic Symptoms:No data recorded Hallucinations:Hallucinations: None  Ideas of Reference:None  Suicidal Thoughts:Suicidal Thoughts: No  Homicidal Thoughts:Homicidal Thoughts: No   Sensorium  Memory:Immediate Good; Recent Good  Judgment:Poor  Insight:Poor   Executive Functions  Concentration:Good  Attention Span:Good  [place] of Knowledge:Good  Language:Good   Psychomotor Activity  Psychomotor Activity:Psychomotor Activity: Normal   Assets  Assets:Communication Skills; Desire for Improvement; Resilience; Physical Health   Sleep  Sleep:Sleep: Good Number of Hours of Sleep: 6.75   Physical Exam: Physical Exam Vitals and nursing note reviewed.  Constitutional:      Appearance: Normal appearance.  HENT:     Head: Normocephalic and atraumatic.     Nose: Nose normal.  Eyes:     Extraocular Movements: Extraocular movements intact.  Pulmonary:     Effort:  Pulmonary effort is normal.  Musculoskeletal:        General: Normal range of motion.     Cervical back: Normal range of motion.  Neurological:     General: No focal deficit present.     Mental Status: She is alert and oriented to person, place, and time.  Psychiatric:        Attention and Perception: Attention normal.        Mood and Affect: Mood normal.        Speech: Speech normal.        Behavior: Behavior normal. Behavior is cooperative.        Thought Content: Thought content normal. Thought content is not paranoid or delusional. Thought content does not include homicidal or suicidal ideation.        Cognition and Memory: Cognition and memory normal. Cognition is not impaired. Memory is not impaired. She does not exhibit impaired recent memory or impaired remote memory.        Judgment: Judgment normal.   ROS Blood pressure (!) 141/102, pulse (!) 102, temperature 98.9 F (37.2 C), temperature source Oral, resp. rate 18, height 5\' 6"  (1.676 m), weight 59.9 kg, last menstrual period 12/09/2020, SpO2 98 %, currently breastfeeding. Body mass index is 21.31 kg/m.  Mental Status Per Nursing Assessment::   On Admission:  Suicidal ideation indicated by patient, Self-harm thoughts, Belief that plan would result in death  Demographic Factors:  Caucasian, Low socioeconomic status, and Unemployed  Loss Factors: Decrease in vocational status, Loss of significant relationship, and Financial problems/change in socioeconomic status  Historical Factors: Impulsivity and substance use prior to admission, previous psychiatric diagnoses/treatments, prior suicide attempts  Risk Reduction Factors:   Positive social support  Continued Clinical Symptoms:  Alcohol/Substance Abuse/Dependencies  Cognitive Features That Contribute To Risk:  None    Suicide Risk:  Mild:  Suicidal ideation of limited  frequency, intensity, duration, and specificity.  There are no identifiable plans, no associated  intent, mild dysphoria and related symptoms, good self-control (both objective and subjective assessment), few other risk factors, and identifiable protective factors, including available and accessible social support.   Follow-up Information     [facility]. Go on 01/05/2021.   Why: You have an appointment to establish care for primary care services on  01/05/21 at 9:15 am. This appointment will be held in person. Contact information: [address] [place] [phone]        [facility]. Call.   Why: Please contact your care manager Ilda Basset for follow up services. Contact information: [address], [place], [place] [address]  Phone: [phone]        [facility]. Call.   Specialty: Behavioral Health Why: Please go to this provider for therapy and medication management services. You may call to schedule an appointment or go during walk in hours:  Monday to Wednesday from 7:45 am to 11 am. Walk in services are first come, first served. Contact information: [address] [place] [address] [phone]                Plan Of Care/Follow-up recommendations:  Activity:  ad lib Other:    Prescriptions for new medications provided for the patient to bridge to follow up appointment. The patient was informed that refills for these prescriptions are generally not provided, and patient is encouraged to attend all follow up appointments to address medication refills and adjustments.   Today's discharge was reviewed with treatment team, and the team is in agreement that the patient is ready for discharge. The patient is was of the discharge plan for today and has been given opportunity to ask questions. At time of discharge, the patient does not vocalize any acute harm to self or others, is goal directed, able to advocate for self and organizational baseline.   At  discharge, the patient is instructed to:  Take all medications as prescribed. Report any adverse effects and or reactions from the medicines to her outpatient provider promptly.  Do not engage in alcohol and/or illegal drug use while on prescription medicines.  In the event of worsening symptoms, patient is instructed to call the crisis hotline, 911 and or go to the nearest ED for appropriate evaluation and treatment of symptoms.  Follow-up with primary care provider for further care of medical issues, concerns and or health care needs.   Roselle Locus, MD 12/15/2020, 11:16 AM

## 2020-12-15 NOTE — Progress Notes (Signed)
D: Pt A & O X 4. Denies SI, HI, AVH and pain at this time. D/C home as ordered. Picked up in lobby by "my friend". A: D/C instructions reviewed with pt including prescriptions and follow up appointments; compliance encouraged. All belongings from locker 16 returned to pt at time of departure. Scheduled and PRN medications given with verbal education and effects monitored. Safety checks maintained without incident till time of d/c.  R: Pt receptive to care. Compliant with medications when offered. Denies adverse drug reactions when assessed. Verbalized understanding related to d/c instructions. Signed belonging sheet in agreement with items received from locker. Ambulatory with a steady gait. Appears to be in no physical distress at time of departure.

## 2020-12-15 NOTE — Progress Notes (Signed)
  [facility] Adult Case Management Discharge Plan :  Will you be returning to the same living situation after discharge:  Yes,  friend's home At discharge, do you have transportation home?: Yes,  friend Do you have the ability to pay for your medications: No.  Release of information consent forms completed;  Patient's signature obtained and sent to medical records.  Patient to Follow up at:  Follow-up Information     [facility]. Go on 01/05/2021.   Why: You have an appointment to establish care for primary care services on  01/05/21 at 9:15 am. This appointment will be held in person. Contact information: [address] [place] [phone]        [facility]. Call.   Why: Please contact your care manager Ilda Basset for follow up services. Contact information: [address], [place], [place] [address]  Phone: [phone]        [facility]. Call.   Specialty: Behavioral Health Why: Please go to this provider for therapy and medication management services. You may call to schedule an appointment or go during walk in hours:  Monday to Wednesday from 7:45 am to 11 am. Walk in services are first come, first served. Contact information: [address] [place] [address] [phone]                Next level of care provider has access to [facility] Link:yes  Safety Planning and Suicide Prevention discussed: Yes,  friend     Has patient been referred to the Quitline?: Patient refused referral  Patient has been referred for addiction treatment: Pt. refused referral  Felizardo Hoffmann, LCSWA 12/15/2020, 10:02 AM

## 2020-12-15 NOTE — Group Note (Signed)
Recreation Therapy Group Note   Group Topic:Stress Management  Group Date: 12/15/2020 Start Time: 0930 End Time: 0950 Facilitators: Caroll Rancher, LRT/CTRS Location: [address] Dayroom  Goal Area(s) Addresses:  Patient will actively participate in stress management techniques presented during session.  Patient will successfully identify benefit of practicing stress management post d/c.    Group Description: Guided Imagery. LRT provided education, instruction, and demonstration on practice of visualization via guided imagery. Patient was asked to participate in the technique introduced during session.  Patients were given suggestions of ways to access scripts post d/c and encouraged to explore Youtube and other apps available on smartphones, tablets, and computers   Affect/Mood: Appropriate   Participation Level: Engaged   Participation Quality: Independent   Behavior: Appropriate   Speech/Thought Process: Focused   Insight: Good   Judgement: Good   Modes of Intervention: Soft Ocean Sounds; Script   Patient Response to Interventions:  Engaged   Education Outcome:  Acknowledges education and In group clarification offered    Clinical Observations/Individualized Feedback: Pt attended and participated in group.   Plan: Continue to engage patient in RT group sessions 2-3x/week.   Caroll Rancher, LRT/CTRS 12/15/2020 11:54 AM

## 2020-12-16 ENCOUNTER — Emergency Department (HOSPITAL_COMMUNITY): Payer: Self-pay

## 2020-12-16 ENCOUNTER — Emergency Department (HOSPITAL_COMMUNITY)
Admission: EM | Admit: 2020-12-16 | Discharge: 2020-12-17 | Disposition: A | Discharge status: Home / Self Care | Payer: Self-pay | Attending: Emergency Medicine | Admitting: Emergency Medicine

## 2020-12-16 ENCOUNTER — Encounter (HOSPITAL_COMMUNITY): Payer: Self-pay

## 2020-12-16 DIAGNOSIS — I10 Essential (primary) hypertension: Secondary | ICD-10-CM | POA: Insufficient documentation

## 2020-12-16 DIAGNOSIS — A539 Syphilis, unspecified: Secondary | ICD-10-CM | POA: Insufficient documentation

## 2020-12-16 DIAGNOSIS — H532 Diplopia: Secondary | ICD-10-CM | POA: Insufficient documentation

## 2020-12-16 DIAGNOSIS — D72829 Elevated white blood cell count, unspecified: Secondary | ICD-10-CM | POA: Insufficient documentation

## 2020-12-16 DIAGNOSIS — F17210 Nicotine dependence, cigarettes, uncomplicated: Secondary | ICD-10-CM | POA: Insufficient documentation

## 2020-12-16 DIAGNOSIS — Z79899 Other long term (current) drug therapy: Secondary | ICD-10-CM | POA: Insufficient documentation

## 2020-12-16 DIAGNOSIS — R519 Headache, unspecified: Secondary | ICD-10-CM | POA: Insufficient documentation

## 2020-12-16 MED ORDER — SODIUM CHLORIDE 0.9 % IV BOLUS
1000.0000 mL | Freq: Once | INTRAVENOUS | Status: AC
  Administered 2020-12-16: 1000 mL via INTRAVENOUS

## 2020-12-16 NOTE — ED Provider Notes (Signed)
[facility]-EMERGENCY DEPT Provider Note   CSN: [id number] Arrival date & time: 12/16/20  2213     History Chief Complaint  Patient presents with   Diplopia    [name] is a 30 y.o. female.  The history is provided by the patient and medical records.   30 year old female with history of ADD, anxiety, bipolar disorder, hepatitis, PTSD, presenting to the ED with diplopia.  States this has been ongoing for the past 2 days but only occurring in her left eye.  Vision in right eye is normal.  She does have a diagnosis of syphilis, this was found in September but was only started on treatment 3 days ago (12/13/20) [facility].  She has been taking the doxycycline.  She states she has been having intermittent fevers, chills, feels incredibly weak, and has some headaches.  She was told at the time that the syphilis may already have "spread to her brain".  She has not had any CT's or MRI's of her brain.  She is due to see ID tomorrow at [facility].  Past Medical History:  Diagnosis Date   ADD (attention deficit disorder)    Anxiety    Bipolar 1 disorder (HCC)    Depression    HA (headache)    Hepatitis    Hypertension    PTSD (post-traumatic stress disorder)    Status post tubal ligation 11/21/2015   Postpartum    Patient Active Problem List   Diagnosis Date Noted   Opioid use disorder 12/12/2020   MDD (major depressive disorder), recurrent severe, without psychosis (HCC) 12/11/2020   Sepsis (HCC) 07/15/2018   Substance use disorder 07/15/2018   Abscess of right axilla 07/15/2018   Hepatitis C antibody test positive 07/15/2018   MDD (major depressive disorder), severe (HCC) 05/31/2018   Major depressive disorder, 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   Status post tubal ligation 11/21/2015   Postpartum care following vaginal delivery 11/20/2015   Post term pregnancy at [redacted] weeks gestation 11/19/2015    PTSD (post-traumatic stress disorder)    Depression    Bipolar 1 disorder (HCC)    Major depression 02/16/2013   Alcohol abuse 02/16/2013   Anxiety state, unspecified 02/16/2013    Past Surgical History:  Procedure Laterality Date   MOUTH SURGERY     TUBAL LIGATION Bilateral 11/21/2015   Procedure: POST PARTUM TUBAL LIGATION;  Surgeon: Conard Novak, MD;  Location: [facility] ORS;  Service: Gynecology;  Laterality: Bilateral;     OB History     Gravida  5   Para  4   Term  4   Preterm      AB  1   Living  4      SAB  1   IAB      Ectopic      Multiple  0   Live Births  4           Family History  Problem Relation Age of Onset   Alcohol abuse Mother    Depression Mother    Heart attack Mother    Alcohol abuse Father    Diabetes Father     Social History   Tobacco Use   Smoking status: Every Day    Packs/day: 0.50    Types: Cigarettes   Smokeless tobacco: Never  Substance Use Topics   Alcohol use: Yes    Alcohol/week: 0.0 standard drinks    Comment: Patient endorses drinking  occassionally. Amount unspecified    Drug use: Yes    Types: Methamphetamines, Heroin, Amphetamines, Marijuana, Cocaine, IV    Comment: heroin    Home Medications Prior to Admission medications   Medication Sig Start Date End Date Taking? Authorizing Provider  doxycycline (VIBRA-TABS) 100 MG tablet Take 1 tablet (100 mg total) by mouth every 12 (twelve) hours for 24 days. 12/15/20 01/08/21  Armando Reichert, MD  escitalopram (LEXAPRO) 5 MG tablet Take 1 tablet (5 mg total) by mouth daily. 12/16/20   Armando Reichert, MD  hydrOXYzine (ATARAX/VISTARIL) 25 MG tablet Take 1 tablet (25 mg total) by mouth 3 (three) times daily as needed for anxiety. 12/15/20   Armando Reichert, MD  ibuprofen (ADVIL) 200 MG tablet Take 400 mg by mouth every 6 (six) hours as needed for mild pain.    [provider]  nicotine (NICODERM CQ - DOSED IN MG/24 HOURS) 14 mg/24hr patch Place 1 patch (14 mg  total) onto the skin daily. 12/16/20   Armando Reichert, MD  QUEtiapine (SEROQUEL) 50 MG tablet Take 1 tablet (50 mg total) by mouth at bedtime. 12/15/20   Armando Reichert, MD    Allergies    Patient has no known allergies.  Review of Systems   Review of Systems  Constitutional:  Positive for chills, fatigue and fever.  Eyes:  Positive for visual disturbance.  All other systems reviewed and are negative.  Physical Exam Updated Vital Signs BP (!) 122/93 (BP Location: Left Arm)   Pulse 92   Temp 99.6 F (37.6 C) (Oral)   Resp 18   Ht 5\' 6"  (1.676 m)   Wt 59.9 kg   LMP 12/09/2020   SpO2 93%   BMI 21.31 kg/m   Physical Exam Vitals and nursing note reviewed.  Constitutional:      Appearance: She is well-developed.  HENT:     Head: Normocephalic and atraumatic.  Eyes:     Conjunctiva/sclera: Conjunctivae normal.     Pupils: Pupils are equal, round, and reactive to light.     Comments: PERRL, EOMI  Cardiovascular:     Rate and Rhythm: Normal rate and regular rhythm.     Heart sounds: Normal heart sounds.  Pulmonary:     Effort: Pulmonary effort is normal. No respiratory distress.     Breath sounds: Normal breath sounds. No rhonchi.  Abdominal:     General: Bowel sounds are normal.     Palpations: Abdomen is soft.  Musculoskeletal:        General: Normal range of motion.     Cervical back: Normal range of motion.  Skin:    General: Skin is warm and dry.     Comments: No bodily rash noted, no lesions on palms/soles  Neurological:     Mental Status: She is alert and oriented to person, place, and time.     Comments: AAOx3, answering questions and following commands appropriately, speech is clear and goal oriented    ED Results / Procedures / Treatments   Labs (all labs ordered are listed, but only abnormal results are displayed) Labs Reviewed  CBC WITH DIFFERENTIAL/PLATELET - Abnormal; Notable for the following components:      Result Value   WBC 13.1 (*)    Hemoglobin  15.7 (*)    Lymphs Abs 4.3 (*)    Monocytes Absolute 1.1 (*)    All other components within normal limits  COMPREHENSIVE METABOLIC PANEL - Abnormal; Notable for the following components:   CO2 21 (*)  BUN 24 (*)    Creatinine, Ser 0.41 (*)    AST 46 (*)    ALT 109 (*)    Total Bilirubin 1.3 (*)    All other components within normal limits  CULTURE, BLOOD (ROUTINE X 2)  CULTURE, BLOOD (ROUTINE X 2)  URINE CULTURE  LACTIC ACID, PLASMA  RAPID HIV SCREEN (HIV 1/2 AB+AG)  RPR  URINALYSIS, ROUTINE W REFLEX MICROSCOPIC  I-STAT BETA HCG BLOOD, ED ([facility], [facility], [facility] ONLY)    EKG None  Radiology DG Chest 2 View  Result Date: 12/17/2020 CLINICAL DATA:  Fever, syphilis EXAM: CHEST - 2 VIEW COMPARISON:  07/15/2018 FINDINGS: Lungs are clear.  No pleural effusion or pneumothorax. The heart is normal in size. Visualized osseous structures are within normal limits. IMPRESSION: Normal chest radiographs. Electronically Signed   By: Julian Hy M.D.   On: 12/17/2020 00:13   CT HEAD WO CONTRAST (5MM)  Result Date: 12/16/2020 CLINICAL DATA:  Double vision. EXAM: CT HEAD WITHOUT CONTRAST TECHNIQUE: Contiguous axial images were obtained from the base of the skull through the vertex without intravenous contrast. COMPARISON:  August 26, 2020 FINDINGS: Brain: No evidence of acute infarction, hemorrhage, hydrocephalus, extra-axial collection or mass lesion/mass effect. Vascular: No hyperdense vessel or unexpected calcification. Skull: Normal. Negative for fracture or focal lesion. Sinuses/Orbits: No acute finding. Other: None. IMPRESSION: No acute intracranial pathology. Electronically Signed   By: Virgina Norfolk M.D.   On: 12/16/2020 23:31    Procedures Procedures   Medications Ordered in ED Medications  sodium chloride 0.9 % bolus 1,000 mL (1,000 mLs Intravenous New Bag/Given 12/16/20 2354)    ED Course  I have reviewed the triage vital signs and the nursing notes.  Pertinent labs & imaging  results that were available during my care of the patient were reviewed by me and considered in my medical decision making (see chart for details).    MDM Rules/Calculators/A&P                           30 y.o. F here with diplopia of left eye.  Has known syphilis since September 2022, but started on doxycycline 3 days ago while IP at [facility].  Due to see ID later today.  States for the past 48 hours has had persistent diplopia of left eye only.  Denies focal numbness/weakness.  She is AAOx3 here, no focal deficits on my exam.  There is no bodily rash visible, no lesions on palms/soles.  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  Will get labs, CT head, CXR.    Labs are overall reassuring.  Does have leukocytosis at 13.1.  LFT"s mildly elevated, known hx of hepatitis.  Normal lactate.  Blood cultures sent.  CT head negative for acute findings.  I do feel that she will likely need MRI of brain +/- orbits to assess for focal lesions.  Will discuss with neurology.  12:27 AM Discussed with neurology, Dr. Leonel Ramsay-- recommends MRI brain and orbits w/ and w/out contrast to assess for stroke or ocular lesion.  If no acute findings, can follow-up with ID later on today as scheduled.  As MRI not available at this facility currently, will send to [facility] for MRI's to be done tonight.  Dr. Tyrone Nine at [facility] is accepting, Charge RN also aware.  Patient understand's reasoning for transfer and necessity of MRI, she is agreeable.  Final Clinical Impression(s) / ED Diagnoses Final diagnoses:  Diplopia  Syphilis    Rx /  DC Orders ED Discharge Orders     None        Kathryne Hitch 12/17/20 0136    Quintella Reichert, MD 12/17/20 [phone]

## 2020-12-16 NOTE — ED Triage Notes (Signed)
Patient arrives from home with complaint of  double vision that began yesterday. Pt also endorses high fever, insomnia, and feeling itchy. Pt reports being diagnoses with syphilis with possible spread to brain, but states her appointment with infectious disease is not until tomorrow.

## 2020-12-17 ENCOUNTER — Ambulatory Visit (INDEPENDENT_AMBULATORY_CARE_PROVIDER_SITE_OTHER): Payer: Self-pay | Admitting: Internal Medicine

## 2020-12-17 ENCOUNTER — Other Ambulatory Visit: Payer: Self-pay

## 2020-12-17 ENCOUNTER — Encounter: Payer: Self-pay | Admitting: Internal Medicine

## 2020-12-17 ENCOUNTER — Emergency Department (HOSPITAL_COMMUNITY): Payer: Self-pay

## 2020-12-17 ENCOUNTER — Emergency Department (HOSPITAL_COMMUNITY)
Admission: EM | Admit: 2020-12-17 | Discharge: 2020-12-17 | Disposition: A | Discharge status: Home / Self Care | Payer: Medicaid Other | Attending: Emergency Medicine | Admitting: Emergency Medicine

## 2020-12-17 VITALS — BP 135/87 | HR 115 | Temp 98.8°F | Resp 16 | Ht 66.0 in | Wt 124.0 lb

## 2020-12-17 DIAGNOSIS — H532 Diplopia: Secondary | ICD-10-CM

## 2020-12-17 DIAGNOSIS — Z20822 Contact with and (suspected) exposure to covid-19: Secondary | ICD-10-CM | POA: Insufficient documentation

## 2020-12-17 DIAGNOSIS — A539 Syphilis, unspecified: Secondary | ICD-10-CM

## 2020-12-17 DIAGNOSIS — I10 Essential (primary) hypertension: Secondary | ICD-10-CM | POA: Insufficient documentation

## 2020-12-17 DIAGNOSIS — F17210 Nicotine dependence, cigarettes, uncomplicated: Secondary | ICD-10-CM | POA: Insufficient documentation

## 2020-12-17 DIAGNOSIS — H538 Other visual disturbances: Secondary | ICD-10-CM | POA: Insufficient documentation

## 2020-12-17 LAB — COMPREHENSIVE METABOLIC PANEL
ALT: 109 U/L — ABNORMAL HIGH (ref 0–44)
ALT: 95 U/L — ABNORMAL HIGH (ref 0–44)
AST: 40 U/L (ref 15–41)
AST: 46 U/L — ABNORMAL HIGH (ref 15–41)
Albumin: 4.1 g/dL (ref 3.5–5.0)
Albumin: 4.1 g/dL (ref 3.5–5.0)
Alkaline Phosphatase: 74 U/L (ref 38–126)
Alkaline Phosphatase: 77 U/L (ref 38–126)
Anion gap: 8 (ref 5–15)
Anion gap: 9 (ref 5–15)
BUN: 15 mg/dL (ref 6–20)
BUN: 24 mg/dL — ABNORMAL HIGH (ref 6–20)
CO2: 21 mmol/L — ABNORMAL LOW (ref 22–32)
CO2: 25 mmol/L (ref 22–32)
Calcium: 8.9 mg/dL (ref 8.9–10.3)
Calcium: 9.2 mg/dL (ref 8.9–10.3)
Chloride: 106 mmol/L (ref 98–111)
Chloride: 107 mmol/L (ref 98–111)
Creatinine, Ser: 0.41 mg/dL — ABNORMAL LOW (ref 0.44–1.00)
Creatinine, Ser: 0.66 mg/dL (ref 0.44–1.00)
GFR, Estimated: >60 mL/min (ref >60)
GFR, Estimated: >60 mL/min (ref >60)
Glucose, Bld: 102 mg/dL — ABNORMAL HIGH (ref 70–99)
Glucose, Bld: 96 mg/dL (ref 70–99)
Potassium: 3.4 mmol/L — ABNORMAL LOW (ref 3.5–5.1)
Potassium: 3.8 mmol/L (ref 3.5–5.1)
Sodium: 136 mmol/L (ref 135–145)
Sodium: 140 mmol/L (ref 135–145)
Total Bilirubin: 0.9 mg/dL (ref 0.3–1.2)
Total Bilirubin: 1.3 mg/dL — ABNORMAL HIGH (ref 0.3–1.2)
Total Protein: 7.5 g/dL (ref 6.5–8.1)
Total Protein: 7.6 g/dL (ref 6.5–8.1)

## 2020-12-17 LAB — URINALYSIS, ROUTINE W REFLEX MICROSCOPIC
Bacteria, UA: NONE SEEN
Bilirubin Urine: NEGATIVE
Glucose, UA: NEGATIVE mg/dL
Hgb urine dipstick: NEGATIVE
Ketones, ur: NEGATIVE mg/dL
Leukocytes,Ua: NEGATIVE
Nitrite: NEGATIVE
Protein, ur: 30 mg/dL — AB
Specific Gravity, Urine: 1.035 — ABNORMAL HIGH (ref 1.005–1.030)
pH: 5 (ref 5.0–8.0)

## 2020-12-17 LAB — CBC WITH DIFFERENTIAL/PLATELET
Abs Immature Granulocytes: 0.04 10*3/uL (ref 0.00–0.07)
Abs Immature Granulocytes: 0.07 10*3/uL (ref 0.00–0.07)
Basophils Absolute: 0.1 10*3/uL (ref 0.0–0.1)
Basophils Absolute: 0.1 10*3/uL (ref 0.0–0.1)
Basophils Relative: 1 %
Basophils Relative: 1 %
Eosinophils Absolute: 0.1 10*3/uL (ref 0.0–0.5)
Eosinophils Absolute: 0.2 10*3/uL (ref 0.0–0.5)
Eosinophils Relative: 1 %
Eosinophils Relative: 1 %
HCT: 44.4 % (ref 36.0–46.0)
HCT: 45.1 % (ref 36.0–46.0)
Hemoglobin: 15 g/dL (ref 12.0–15.0)
Hemoglobin: 15.7 g/dL — ABNORMAL HIGH (ref 12.0–15.0)
Immature Granulocytes: 0 %
Immature Granulocytes: 1 %
Lymphocytes Relative: 32 %
Lymphocytes Relative: 33 %
Lymphs Abs: 3.4 10*3/uL (ref 0.7–4.0)
Lymphs Abs: 4.3 10*3/uL — ABNORMAL HIGH (ref 0.7–4.0)
MCH: 31.1 pg (ref 26.0–34.0)
MCH: 31.5 pg (ref 26.0–34.0)
MCHC: 33.8 g/dL (ref 30.0–36.0)
MCHC: 34.8 g/dL (ref 30.0–36.0)
MCV: 90.6 fL (ref 80.0–100.0)
MCV: 91.9 fL (ref 80.0–100.0)
Monocytes Absolute: 0.7 10*3/uL (ref 0.1–1.0)
Monocytes Absolute: 1.1 10*3/uL — ABNORMAL HIGH (ref 0.1–1.0)
Monocytes Relative: 6 %
Monocytes Relative: 9 %
Neutro Abs: 6.2 10*3/uL (ref 1.7–7.7)
Neutro Abs: 7.4 10*3/uL (ref 1.7–7.7)
Neutrophils Relative %: 55 %
Neutrophils Relative %: 60 %
Platelets: 307 10*3/uL (ref 150–400)
Platelets: 337 10*3/uL (ref 150–400)
RBC: 4.83 MIL/uL (ref 3.87–5.11)
RBC: 4.98 MIL/uL (ref 3.87–5.11)
RDW: 13.2 % (ref 11.5–15.5)
RDW: 13.2 % (ref 11.5–15.5)
WBC: 10.5 10*3/uL (ref 4.0–10.5)
WBC: 13.1 10*3/uL — ABNORMAL HIGH (ref 4.0–10.5)
nRBC: 0 % (ref 0.0–0.2)
nRBC: 0 % (ref 0.0–0.2)

## 2020-12-17 LAB — RESP PANEL BY RT-PCR (FLU A&B, COVID) ARPGX2
Influenza A by PCR: NEGATIVE
Influenza B by PCR: NEGATIVE
SARS Coronavirus 2 by RT PCR: NEGATIVE

## 2020-12-17 LAB — RAPID HIV SCREEN (HIV 1/2 AB+AG)
HIV 1/2 Antibodies: NONREACTIVE
HIV-1 P24 Antigen - HIV24: NONREACTIVE

## 2020-12-17 LAB — CSF CELL COUNT WITH DIFFERENTIAL
RBC Count, CSF: 1 /mm3 — ABNORMAL HIGH
RBC Count, CSF: 3 /mm3 — ABNORMAL HIGH
Tube #: 1
Tube #: 4
WBC, CSF: 1 /mm3 (ref 0–5)
WBC, CSF: 2 /mm3 (ref 0–5)

## 2020-12-17 LAB — GLUCOSE, CSF: Glucose, CSF: 60 mg/dL (ref 40–70)

## 2020-12-17 LAB — RPR
RPR Ser Ql: REACTIVE — AB
RPR Titer: 1:8 {titer}

## 2020-12-17 LAB — I-STAT BETA HCG BLOOD, ED (MC, WL, AP ONLY): I-stat hCG, quantitative: <5 m[IU]/mL (ref <5)

## 2020-12-17 LAB — PROTEIN, CSF: Total  Protein, CSF: 21 mg/dL (ref 15–45)

## 2020-12-17 LAB — LACTIC ACID, PLASMA: Lactic Acid, Venous: 0.9 mmol/L (ref 0.5–1.9)

## 2020-12-17 MED ORDER — IBUPROFEN 800 MG PO TABS
800.0000 mg | ORAL_TABLET | Freq: Once | ORAL | Status: AC
  Administered 2020-12-17: 800 mg via ORAL
  Filled 2020-12-17: qty 1

## 2020-12-17 MED ORDER — GADOBUTROL 1 MMOL/ML IV SOLN
6.0000 mL | Freq: Once | INTRAVENOUS | Status: AC | PRN
  Administered 2020-12-17: 6 mL via INTRAVENOUS

## 2020-12-17 MED ORDER — SODIUM CHLORIDE 0.9 % IV BOLUS
1000.0000 mL | Freq: Once | INTRAVENOUS | Status: AC
  Administered 2020-12-17: 1000 mL via INTRAVENOUS

## 2020-12-17 MED ORDER — LIDOCAINE HCL 2 % IJ SOLN
20.0000 mL | Freq: Once | INTRAMUSCULAR | Status: AC
  Administered 2020-12-17: 400 mg
  Filled 2020-12-17: qty 20

## 2020-12-17 NOTE — ED Provider Notes (Signed)
Patient was transferred here from [facility], patient recently diagnosed and started on antibiotics for syphilis.  She developed double vision and was sent here for MRI after discussion with the neurologist on-call.  Plan if MRI is negative to have her follow-up as scheduled with infectious disease today.  Felt no need for emergent further testing of CSF.  Patient's MRI has resulted and is negative.  She is able to ambulate here without issue.  We will discharge the patient home.  ID follow-up today.   Melene Plan, DO 12/17/20 [phone]

## 2020-12-17 NOTE — Patient Instructions (Signed)
Please present to ED for LP.

## 2020-12-17 NOTE — ED Provider Notes (Signed)
Emergency Medicine Provider Triage Evaluation Note  OLANDA BOUGHNER , a 30 y.o. female  was evaluated in triage.  Pt complains of need for neurosyphilis evaluation.  Patient was seen in the ED yesterday and discharged with infectious disease follow-up for neurosyphilis today.  Her MRI done yesterday was reassuring, LP was deferred at that time.  Infectious disease saw the patient in office today and believes the patient should have an LP to evaluate for neurosyphilis.. Reports that diploplia problem resolved on its own, was present for 24 hours prompted her to go to [facility] yesterday.  Review of Systems  Positive: LP for neurosyphilis Negative: Diplopia, headache  Physical Exam  BP 128/86 (BP Location: Left Arm)   Pulse 100   Temp 98 F (36.7 C) (Oral)   Resp 17   Ht 5\' 6"  (1.676 m)   Wt 56.2 kg   LMP 12/09/2020   SpO2 100%   BMI 20.01 kg/m  Gen:   Awake, no distress   Resp:  Normal effort  MSK:   Moves extremities without difficulty  Other:  No nystagmus  Medical Decision Making  Medically screening exam initiated at 4:22 PM.  Appropriate orders placed.  12/11/2020 was informed that the remainder of the evaluation will be completed by another provider, this initial triage assessment does not replace that evaluation, and the importance of remaining in the ED until their evaluation is complete.  Vitals are stable, patient is not in any acute distress.   Flossie Buffy, PA-C 12/17/20 1626    13/04/22, MD 12/17/20 [phone]

## 2020-12-17 NOTE — ED Notes (Signed)
Patient transported to MRI 

## 2020-12-17 NOTE — Progress Notes (Signed)
Patient: [name]  DOB: 12-10-1990 MRN: [id number] PCP: Pcp, No      Patient Active Problem List   Diagnosis Date Noted   Opioid use disorder 12/12/2020   MDD (major depressive disorder), recurrent severe, without psychosis ([place]) 12/11/2020   Sepsis ([place]) 07/15/2018   Substance use disorder 07/15/2018   Abscess of right axilla 07/15/2018   Hepatitis C antibody test positive 07/15/2018   MDD (major depressive disorder), severe ([place]) 05/31/2018   Major depressive disorder, recurrent severe without psychotic features ([place]) 12/14/2016   Status post tubal ligation 11/21/2015   Postpartum care following vaginal delivery 11/20/2015   Post term pregnancy at [redacted] weeks gestation 11/19/2015   PTSD (post-traumatic stress disorder)    Depression    Bipolar 1 disorder ([place])    Major depression 02/16/2013   Alcohol abuse 02/16/2013   Anxiety state, unspecified 02/16/2013     Subjective:  [name] is a 30 y.o. 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 She was tested/treated for syphilis  on 10/26/20 after an exposure for syphilis in December 2021. Her partner in December, 2021 tested positive for syphilis and was treated.  Pt was recently hospitalized  12/11/20-12/15/20 for suicidal ideations. 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 Pt has the Rx for doxy but has not taken it yet.  She started having left lazy eye after discharge from [facility] on 11/2. She had associated blurry vision. She presented to the ED as she was concerned for neurosyphilis. Her lazy/discharge resolved last night. She continues to have left eye strain. MRI was negative in ED.  She reports that she has shared needles in the past. She injects into her hands.   Review of Systems  Constitutional:  Positive for chills.  Eyes:  Positive for  double vision.       Left eye strain  Respiratory: Negative.    Gastrointestinal:  Positive for nausea.  Genitourinary: Negative.   Musculoskeletal: Negative.   Neurological:  Negative for headaches.   Past Medical History:  Diagnosis Date   ADD (attention deficit disorder)    Anxiety    Bipolar 1 disorder (HCC)    Depression    HA (headache)    Hepatitis    Hypertension    PTSD (post-traumatic stress disorder)    Status post tubal ligation 11/21/2015   Postpartum    Outpatient Medications Prior to Visit  Medication Sig Dispense Refill   doxycycline (VIBRA-TABS) 100 MG tablet Take 1 tablet (100 mg total) by mouth every 12 (twelve) hours for 24 days. 48 tablet 0   escitalopram (LEXAPRO) 5 MG tablet Take 1 tablet (5 mg total) by mouth daily. 30 tablet 0   hydrOXYzine (ATARAX/VISTARIL) 25 MG tablet Take 1 tablet (25 mg total) by mouth 3 (three) times daily as needed for anxiety. 30 tablet 0   ibuprofen (ADVIL) 200 MG tablet Take 400 mg by mouth every 6 (six) hours as needed for mild pain.     nicotine (NICODERM CQ - DOSED IN MG/24 HOURS) 14 mg/24hr patch Place 1 patch (14 mg total) onto the skin daily. 28 patch 0   QUEtiapine (SEROQUEL) 50 MG tablet Take 1 tablet (50 mg total) by mouth at bedtime. 30 tablet 0   No facility-administered medications prior to visit.     No  Known Allergies  Social History   Tobacco Use   Smoking status: Every Day    Packs/day: 0.50    Types: Cigarettes   Smokeless tobacco: Never  Substance Use Topics   Alcohol use: Yes    Alcohol/week: 0.0 standard drinks    Comment: Patient endorses drinking occassionally. Amount unspecified    Drug use: Yes    Types: Methamphetamines, Heroin, Amphetamines, Marijuana, Cocaine, IV    Comment: heroin    Family History  Problem Relation Age of Onset   Alcohol abuse Mother    Depression Mother    Heart attack Mother    Alcohol abuse Father    Diabetes Father     Objective:  There were no vitals  filed for this visit. There is no height or weight on file to calculate BMI.  Physical Exam Constitutional:      Appearance: Normal appearance.  HENT:     Head: Normocephalic and atraumatic.     Right Ear: Tympanic membrane normal.     Left Ear: Tympanic membrane normal.     Nose: Nose normal.     Mouth/Throat:     Mouth: Mucous membranes are moist.  Eyes:     Extraocular Movements: Extraocular movements intact.     Conjunctiva/sclera: Conjunctivae normal.     Pupils: Pupils are equal, round, and reactive to light.  Cardiovascular:     Rate and Rhythm: Normal rate and regular rhythm.     Heart sounds: No murmur heard.   No friction rub. No gallop.  Pulmonary:     Effort: Pulmonary effort is normal.     Breath sounds: Normal breath sounds.  Abdominal:     General: Abdomen is flat.     Palpations: Abdomen is soft.  Musculoskeletal:        General: Normal range of motion.  Skin:    General: Skin is warm and dry.  Neurological:     General: No focal deficit present.     Mental Status: She is alert and oriented to person, place, and time.  Psychiatric:        Mood and Affect: Mood normal.    Lab Results: Lab Results  Component Value Date   WBC 13.1 (H) 12/16/2020   HGB 15.7 (H) 12/16/2020   HCT 45.1 12/16/2020   MCV 90.6 12/16/2020   PLT 337 12/16/2020    Lab Results  Component Value Date   CREATININE 0.41 (L) 12/16/2020   BUN 24 (H) 12/16/2020   NA 136 12/16/2020   K 3.8 12/16/2020   CL 107 12/16/2020   CO2 21 (L) 12/16/2020    Lab Results  Component Value Date   ALT 109 (H) 12/16/2020   AST 46 (H) 12/16/2020   ALKPHOS 77 12/16/2020   BILITOT 1.3 (H) 12/16/2020     Assessment & Plan:  #Rule out neurosyphilis -Although she reports double vision has resolved, left eye strain remains. Neurosyphilis can develop at any stage of syphilis. She had been treated for early syphilis(9 months from known syphilis exposure) on 10/26/20. I am concerned that she may had  latent syphilis and was not adequately treated vs treatment failure of latent syphilis. Regardless she  should have an LP to evaluate for neurosyphilis. She is not PICC line candidate due to IVDA Hx. As such if CSF testing is c/w syphilis would need complete treatment at a facility. Plan: -Counseled pt to go to ED for LP(please obtain VDRL, cell count, protein, glucose, csf Cx) -Although it may  take a few days for VDRL to return, can start PEN 4MU q4h after LP if csf studies are c/w neurosyphilis(Ptn>45 or wbc >5)     Laurice Record, MD [place]   12/17/20  2:31 PM

## 2020-12-17 NOTE — ED Notes (Signed)
[facility] at bedside 

## 2020-12-17 NOTE — ED Notes (Signed)
Pt arrived via [facility] as a transfer from [facility] to have a MRI scan. Pt A&OX4.

## 2020-12-17 NOTE — Discharge Instructions (Signed)
Your MRI yesterday was unremarkable and your CSF fluids are normal right now.  Expect to have some headache and double vision and blurry vision.   Please stay hydrated and take Tylenol Motrin for headache  You have some CSF results that will take several days to come back   Please follow-up with ID clinic next week and continue taking her doxycycline as prescribed by your doctor  Return to ER if you have worse blurry vision, double vision, headache, fevers, vomiting.

## 2020-12-17 NOTE — ED Notes (Signed)
Report given to [facility] 

## 2020-12-17 NOTE — Discharge Instructions (Addendum)
Your MRI did not show any acute findings.  You need to follow-up with the infectious disease physicians today in the office and have them evaluate you.

## 2020-12-17 NOTE — ED Provider Notes (Signed)
[facility] DEPT Provider Note   CSN: [id number] Arrival date & time: 12/17/20  1607     History Chief Complaint  Patient presents with   Referred by ID    [name] is a 30 y.o. female hx of bipolar, hypertension, here presenting with possible neurosyphilis.  Patient was diagnosed with syphilis has been on doxycycline.  Patient had blurry vision yesterday.  She came to the ER had a negative MRI brain and MRI orbits.  Patient went to ID clinic today.  She was seen by Dr. Candiss Norse and was told that she needs an LP.  Patient has no further fevers.  Patient has no further blurry vision.  The history is provided by the patient.      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   Sepsis ([place]) 07/15/2018   Substance use disorder 07/15/2018   Abscess of right axilla 07/15/2018   Hepatitis C antibody test positive 07/15/2018   MDD (major depressive disorder), severe ([place]) 05/31/2018   Major depressive disorder, recurrent severe without psychotic features ([place]) 12/14/2016   Status post tubal ligation 11/21/2015   Postpartum care following vaginal delivery 11/20/2015   Post term pregnancy at [redacted] weeks gestation 11/19/2015   PTSD (post-traumatic stress disorder)    Depression    Bipolar 1 disorder ([place])    Major depression 02/16/2013   Alcohol abuse 02/16/2013   Anxiety state, unspecified 02/16/2013    Past Surgical History:  Procedure Laterality Date   MOUTH SURGERY     TUBAL LIGATION Bilateral 11/21/2015   Procedure: POST PARTUM TUBAL LIGATION;   Surgeon: Will Bonnet, MD;  Location: [facility] ORS;  Service: Gynecology;  Laterality: Bilateral;     OB History     Gravida  5   Para  4   Term  4   Preterm      AB  1   Living  4      SAB  1   IAB      Ectopic      Multiple  0   Live Births  4           Family History  Problem Relation Age of Onset   Alcohol abuse Mother    Depression Mother    Heart attack Mother    Alcohol abuse Father    Diabetes Father     Social History   Tobacco Use   Smoking status: Every Day    Packs/day: 0.50    Types: Cigarettes   Smokeless tobacco: Never  Substance Use Topics   Alcohol use: Yes    Alcohol/week: 0.0 standard drinks    Comment: Patient endorses drinking occassionally. Amount unspecified    Drug use: Not Currently    Types: Methamphetamines, Heroin, Amphetamines, Marijuana, Cocaine, IV    Comment: pt reports being sober since 12/09/20    Home Medications Prior to Admission medications   Medication Sig Start Date End Date Taking? Authorizing Provider  doxycycline (VIBRA-TABS) 100 MG tablet Take 1 tablet (  100 mg total) by mouth every 12 (twelve) hours for 24 days. 12/15/20 01/08/21  Karsten Ro, MD  escitalopram (LEXAPRO) 5 MG tablet Take 1 tablet (5 mg total) by mouth daily. Patient not taking: Reported on 12/17/2020 12/16/20   Karsten Ro, MD  hydrOXYzine (ATARAX/VISTARIL) 25 MG tablet Take 1 tablet (25 mg total) by mouth 3 (three) times daily as needed for anxiety. 12/15/20   Karsten Ro, MD  ibuprofen (ADVIL) 200 MG tablet Take 400 mg by mouth every 6 (six) hours as needed for mild pain. Patient not taking: Reported on 12/17/2020    [provider]  nicotine (NICODERM CQ - DOSED IN MG/24 HOURS) 14 mg/24hr patch Place 1 patch (14 mg total) onto the skin daily. Patient not taking: Reported on 12/17/2020 12/16/20   Karsten Ro, MD  QUEtiapine (SEROQUEL) 50 MG tablet Take 1 tablet (50 mg total) by mouth at bedtime. 12/15/20   Karsten Ro, MD     Allergies    Patient has no known allergies.  Review of Systems   Review of Systems  Eyes:  Positive for visual disturbance.  All other systems reviewed and are negative.  Physical Exam Updated Vital Signs BP (!) 141/95   Pulse 96   Temp 98 F (36.7 C) (Oral)   Resp 16   Ht 5\' 6"  (1.676 m)   Wt 56.2 kg   LMP 12/09/2020   SpO2 99%   BMI 20.01 kg/m   Physical Exam Vitals and nursing note reviewed.  Constitutional:      Appearance: Normal appearance.  HENT:     Head: Normocephalic.     Nose: Nose normal.  Eyes:     Extraocular Movements: Extraocular movements intact.     Pupils: Pupils are equal, round, and reactive to light.  Neck:     Comments: No meningeal sign Cardiovascular:     Rate and Rhythm: Normal rate and regular rhythm.     Pulses: Normal pulses.     Heart sounds: Normal heart sounds.  Pulmonary:     Effort: Pulmonary effort is normal.     Breath sounds: Normal breath sounds.  Abdominal:     General: Abdomen is flat.     Palpations: Abdomen is soft.  Musculoskeletal:        General: Normal range of motion.     Cervical back: Normal range of motion and neck supple.  Skin:    General: Skin is warm.     Capillary Refill: Capillary refill takes less than 2 seconds.  Neurological:     General: No focal deficit present.     Mental Status: She is alert and oriented to person, place, and time.     Cranial Nerves: No cranial nerve deficit.     Sensory: No sensory deficit.     Motor: No weakness.     Coordination: Coordination normal.  Psychiatric:        Mood and Affect: Mood normal.    ED Results / Procedures / Treatments   Labs (all labs ordered are listed, but only abnormal results are displayed) Labs Reviewed  COMPREHENSIVE METABOLIC PANEL - Abnormal; Notable for the following components:      Result Value   Potassium 3.4 (*)    Glucose, Bld 102 (*)    ALT 95 (*)    All other components within normal limits  CSF CELL COUNT WITH  DIFFERENTIAL - Abnormal; Notable for the following components:   Appearance, CSF CLEAR (*)    RBC Count,  CSF 3 (*)    All other components within normal limits  CSF CELL COUNT WITH DIFFERENTIAL - Abnormal; Notable for the following components:   Appearance, CSF CLEAR (*)    RBC Count, CSF 1 (*)    All other components within normal limits  RESP PANEL BY RT-PCR (FLU A&B, COVID) [id number]  CSF CULTURE W GRAM STAIN  CBC WITH DIFFERENTIAL/PLATELET  GLUCOSE, CSF  PROTEIN, CSF  VDRL, CSF  HSV 1/2 PCR, CSF    EKG None  Radiology DG Chest 2 View  Result Date: 12/17/2020 CLINICAL DATA:  Fever, syphilis EXAM: CHEST - 2 VIEW COMPARISON:  07/15/2018 FINDINGS: Lungs are clear.  No pleural effusion or pneumothorax. The heart is normal in size. Visualized osseous structures are within normal limits. IMPRESSION: Normal chest radiographs. Electronically Signed   By: Julian Hy M.D.   On: 12/17/2020 00:13   CT HEAD WO CONTRAST (5MM)  Result Date: 12/16/2020 CLINICAL DATA:  Double vision. EXAM: CT HEAD WITHOUT CONTRAST TECHNIQUE: Contiguous axial images were obtained from the base of the skull through the vertex without intravenous contrast. COMPARISON:  August 26, 2020 FINDINGS: Brain: No evidence of acute infarction, hemorrhage, hydrocephalus, extra-axial collection or mass lesion/mass effect. Vascular: No hyperdense vessel or unexpected calcification. Skull: Normal. Negative for fracture or focal lesion. Sinuses/Orbits: No acute finding. Other: None. IMPRESSION: No acute intracranial pathology. Electronically Signed   By: Virgina Norfolk M.D.   On: 12/16/2020 23:31   MR Brain W and Wo Contrast  Result Date: 12/17/2020 CLINICAL DATA:  30 year old female with diplopia for 2 days. Untreated syphilis. EXAM: MRI HEAD AND ORBITS WITHOUT AND WITH CONTRAST TECHNIQUE: Multiplanar, multiecho pulse sequences of the brain and surrounding structures were obtained without and with intravenous contrast.  Multiplanar, multiecho pulse sequences of the orbits and surrounding structures were obtained including fat saturation techniques, before and after intravenous contrast administration. CONTRAST:  50mL GADAVIST GADOBUTROL 1 MMOL/ML IV SOLN COMPARISON:  Head CTs 12/16/2020 and earlier. FINDINGS: MRI HEAD FINDINGS Brain: No restricted diffusion to suggest acute infarction. No midline shift, mass effect, evidence of mass lesion, ventriculomegaly, extra-axial collection or acute intracranial hemorrhage. Cervicomedullary junction and pituitary are within normal limits. Normal cerebral volume. [place] Cables and white matter signal is within normal limits throughout the brain. No edema, encephalomalacia, or chronic blood products identified. No abnormal enhancement identified.  No dural thickening identified. Vascular: Major intracranial vascular flow voids are preserved. The major dural venous sinuses are enhancing and appear to be patent. Skull and upper cervical spine: Negative. Visualized bone marrow signal is within normal limits. Other: Mastoids are clear. Grossly normal visible internal auditory structures. Negative visible scalp and face. MRI ORBITS FINDINGS Orbits: Optic radiations appear within normal limits. No midbrain signal abnormality identified. Normal suprasellar cistern. Normal optic chiasm. Pre chiasmatic optic nerves appear symmetric. And there is no intraorbital mass or inflammation. No abnormal enhancement identified. Extraocular muscles and lacrimal glands appear within normal limits. Pituitary and cavernous sinuses appear within normal limits. Visualized sinuses: Trace paranasal sinus mucosal thickening, primarily in the ethmoids. No sinus fluid level or complicating features. Soft tissues: Superficial and deep soft tissue spaces of the face appear symmetric and normal. IMPRESSION: Normal MRI appearance of the brain and orbits. No imaging evidence of neurosyphilis, but CSF analysis would be most sensitive for  syphilitic meningitis. No explanation for diplopia. Electronically Signed   By: Genevie Ann M.D.   On: 12/17/2020 04:54   MR ORBITS W WO CONTRAST  Result Date: 12/17/2020 CLINICAL  DATA:  30 year old female with diplopia for 2 days. Untreated syphilis. EXAM: MRI HEAD AND ORBITS WITHOUT AND WITH CONTRAST TECHNIQUE: Multiplanar, multiecho pulse sequences of the brain and surrounding structures were obtained without and with intravenous contrast. Multiplanar, multiecho pulse sequences of the orbits and surrounding structures were obtained including fat saturation techniques, before and after intravenous contrast administration. CONTRAST:  89mL GADAVIST GADOBUTROL 1 MMOL/ML IV SOLN COMPARISON:  Head CTs 12/16/2020 and earlier. FINDINGS: MRI HEAD FINDINGS Brain: No restricted diffusion to suggest acute infarction. No midline shift, mass effect, evidence of mass lesion, ventriculomegaly, extra-axial collection or acute intracranial hemorrhage. Cervicomedullary junction and pituitary are within normal limits. Normal cerebral volume. [place] Cables and white matter signal is within normal limits throughout the brain. No edema, encephalomalacia, or chronic blood products identified. No abnormal enhancement identified.  No dural thickening identified. Vascular: Major intracranial vascular flow voids are preserved. The major dural venous sinuses are enhancing and appear to be patent. Skull and upper cervical spine: Negative. Visualized bone marrow signal is within normal limits. Other: Mastoids are clear. Grossly normal visible internal auditory structures. Negative visible scalp and face. MRI ORBITS FINDINGS Orbits: Optic radiations appear within normal limits. No midbrain signal abnormality identified. Normal suprasellar cistern. Normal optic chiasm. Pre chiasmatic optic nerves appear symmetric. And there is no intraorbital mass or inflammation. No abnormal enhancement identified. Extraocular muscles and lacrimal glands appear within  normal limits. Pituitary and cavernous sinuses appear within normal limits. Visualized sinuses: Trace paranasal sinus mucosal thickening, primarily in the ethmoids. No sinus fluid level or complicating features. Soft tissues: Superficial and deep soft tissue spaces of the face appear symmetric and normal. IMPRESSION: Normal MRI appearance of the brain and orbits. No imaging evidence of neurosyphilis, but CSF analysis would be most sensitive for syphilitic meningitis. No explanation for diplopia. Electronically Signed   By: Genevie Ann M.D.   On: 12/17/2020 04:54    Procedures .Lumbar Puncture  Date/Time: 12/17/2020 9:47 PM Performed by: Drenda Freeze, MD Authorized by: Drenda Freeze, MD   Consent:    Consent obtained:  Verbal   Consent given by:  Patient   Risks, benefits, and alternatives were discussed: yes     Risks discussed:  Bleeding   Alternatives discussed:  No treatment Universal protocol:    Procedure explained and questions answered to patient or proxy's satisfaction: yes     Relevant documents present and verified: yes     Test results available: yes     Imaging studies available: yes     Patient identity confirmed:  Verbally with patient Pre-procedure details:    Procedure purpose:  Diagnostic   Preparation: Patient was prepped and draped in usual sterile fashion   Anesthesia:    Anesthesia method:  Local infiltration   Local anesthetic:  Lidocaine 2% w/o epi Procedure details:    Lumbar space:  L4-L5 interspace   Patient position:  R lateral decubitus   Needle gauge:  20   Needle type:  Diamond point   Needle length (in):  3.5   Ultrasound guidance: no     Number of attempts:  1   Opening pressure (cm H2O):  18   Fluid appearance:  Clear   Tubes of fluid:  4   Total volume (ml):  10 Post-procedure details:    Puncture site:  Adhesive bandage applied   Procedure completion:  Tolerated   Medications Ordered in ED Medications  lidocaine (XYLOCAINE) 2 %  (with pres) injection 400 mg (400 mg  Infiltration Given by Other 12/17/20 1803)  sodium chloride 0.9 % bolus 1,000 mL (1,000 mLs Intravenous New Bag/Given 12/17/20 1848)    ED Course  I have reviewed the triage vital signs and the nursing notes.  Pertinent labs & imaging results that were available during my care of the patient were reviewed by me and considered in my medical decision making (see chart for details).  Clinical Course as of 12/17/20 2146  [name] Dec 17, 2020  1632 Pregnancy yesterday was negative. [HS]    Clinical Course User Index [HS] Sherrill Raring, PA-C   MDM Rules/Calculators/A&P                           [name] is a 30 y.o. female here presenting with blurry vision.  There was a concern for possible neurosyphilis.  Blurry vision has resolved.  No further fevers.  Patient is well-appearing in no meningeal signs.  I messaged Dr. Candiss Norse from infectious disease.  She recommend LP and give penicillin if her white blood cell count is greater than 5 or protein greater than 45.   9:46 PM CBC is unremarkable.  Chemistry unremarkable.  I reviewed CSF result.  Patient has 1 white blood cell in tube 4.  Patient CSF protein is normal.  I send off CSF culture and VDRL.  I also contacted Dr. Candiss Norse from infectious disease.  She states that from infectious disease perspective, patient is stable for discharge.  Patient is already on doxycycline.  Of note, at right after the LP she had transient blurry vision.  She received IV fluids and felt better.  Her vision is normal upon discharge.  I told her to follow-up with infectious disease next week.  Final Clinical Impression(s) / ED Diagnoses Final diagnoses:  None    Rx / DC Orders ED Discharge Orders     None        Drenda Freeze, MD 12/17/20 2150

## 2020-12-17 NOTE — ED Notes (Signed)
Pt has returned from MRI. 

## 2020-12-17 NOTE — ED Notes (Signed)
Pt ambulatory to and from restroom with independent steady gait. 

## 2020-12-17 NOTE — ED Notes (Signed)
MRI informed the patient has arrived and is ready for the MRI scan

## 2020-12-17 NOTE — ED Notes (Addendum)
Patient signed electronically for consent to transfer to [facility] facility.

## 2020-12-17 NOTE — ED Triage Notes (Signed)
Pt has a diagnosis of syphilis and states she was referred here by infectious disease and told she needs a lumbar puncture.

## 2020-12-17 NOTE — ED Notes (Signed)
[facility] called for transport. 

## 2020-12-19 LAB — URINE CULTURE: Culture: 40000 — AB

## 2020-12-20 ENCOUNTER — Telehealth: Payer: Self-pay | Admitting: Emergency Medicine

## 2020-12-20 LAB — VDRL, CSF: VDRL Quant, CSF: NONREACTIVE

## 2020-12-20 NOTE — Telephone Encounter (Signed)
Post ED Visit - Positive Culture Follow-up  Culture report reviewed by antimicrobial stewardship pharmacist: [facility] []  [facility], Pharm.D. []  [facility], Pharm.D., BCPS AQ-ID []  , Pharm.D., BCPS []  Celedonio Miyamoto, Pharm.D., BCPS []  [place], Garvin Fila.D., BCPS, AAHIVP []  , Pharm.D., BCPS, AAHIVP []  Georgina Pillion, PharmD, BCPS []  , PharmD, BCPS []  [place], PharmD, BCPS [x]  [address], PharmD []  , PharmD, BCPS []  Estella Husk, PharmD  Pharmacy Team []  Lysle Pearl, PharmD []  , PharmD []  Phillips Climes, PharmD []  , Rph []  Agapito Games) , PharmD []  Daylene Posey, PharmD []  , PharmD []  Mervyn Gay, PharmD []  , PharmD []  Vinnie Level, PharmD []  [facility], PharmD []  , PharmD []  Len Childs, PharmD   Positive urine culture No further patient follow-up is required at this time.  Clearance Chenault 12/20/2020, 10:28 AM

## 2020-12-21 LAB — CSF CULTURE W GRAM STAIN
Culture: NO GROWTH
Gram Stain: NONE SEEN

## 2020-12-21 LAB — HSV 1/2 PCR, CSF
HSV-1 DNA: NEGATIVE
HSV-2 DNA: NEGATIVE

## 2020-12-22 LAB — CULTURE, BLOOD (ROUTINE X 2)
Culture: NO GROWTH
Culture: NO GROWTH
Special Requests: ADEQUATE

## 2021-01-05 ENCOUNTER — Inpatient Hospital Stay: Payer: Medicaid Other | Admitting: Family Medicine

## 2021-01-20 ENCOUNTER — Other Ambulatory Visit (HOSPITAL_COMMUNITY): Payer: Self-pay

## 2021-05-12 ENCOUNTER — Encounter (HOSPITAL_COMMUNITY): Payer: Self-pay | Admitting: Emergency Medicine

## 2021-05-12 ENCOUNTER — Emergency Department (HOSPITAL_COMMUNITY): Payer: 59

## 2021-05-12 ENCOUNTER — Emergency Department (HOSPITAL_COMMUNITY)
Admission: EM | Admit: 2021-05-12 | Discharge: 2021-05-12 | Disposition: A | Discharge status: Home / Self Care | Payer: 59 | Attending: Emergency Medicine | Admitting: Emergency Medicine

## 2021-05-12 DIAGNOSIS — M25572 Pain in left ankle and joints of left foot: Secondary | ICD-10-CM

## 2021-05-12 DIAGNOSIS — W108XXA Fall (on) (from) other stairs and steps, initial encounter: Secondary | ICD-10-CM | POA: Insufficient documentation

## 2021-05-12 DIAGNOSIS — M7989 Other specified soft tissue disorders: Secondary | ICD-10-CM

## 2021-05-12 DIAGNOSIS — M25472 Effusion, left ankle: Secondary | ICD-10-CM | POA: Diagnosis not present

## 2021-05-12 DIAGNOSIS — S90812A Abrasion, left foot, initial encounter: Secondary | ICD-10-CM | POA: Diagnosis not present

## 2021-05-12 DIAGNOSIS — S99922A Unspecified injury of left foot, initial encounter: Secondary | ICD-10-CM | POA: Diagnosis not present

## 2021-05-12 NOTE — ED Provider Notes (Signed)
?[facility] DEPT ?Provider Note ? ? ?CSN: [id number] ?Arrival date & time: 05/12/21  2146 ? ?  ? ?History ? ?Chief Complaint  ?Patient presents with  ? Fall  ? ? ?[name] is a 31 y.o. female. ? ?Patient with history of heroin use and polysubstance abuse, neurosyphilis treated in 2022 -- presents to the emergency department, in police custody, for evaluation of left ankle pain and swelling.  Patient reports falling down several stairs 2 days ago.  She has been having difficulty ambulating and states that the pain became worse yesterday.  No fever on arrival.  No vomiting.  She does not report hitting her head or sustaining other injuries when she fell.  When asked specifically, she denies recent injectable drugs or other trauma to the ankles, foot, or toes.   ? ? ?  ? ?Home Medications ?Prior to Admission medications   ?Medication Sig Start Date End Date Taking? Authorizing Provider  ?hydrOXYzine (ATARAX/VISTARIL) 25 MG tablet Take 1 tablet (25 mg total) by mouth 3 (three) times daily as needed for anxiety. 12/15/20   Armando Reichert, MD  ?QUEtiapine (SEROQUEL) 50 MG tablet Take 1 tablet (50 mg total) by mouth at bedtime. 12/15/20   Armando Reichert, MD  ?   ? ?Allergies    ?Patient has no known allergies.   ? ?Review of Systems   ?Review of Systems ? ?Physical Exam ?Updated Vital Signs ?BP 121/88 (BP Location: Left Arm)   Pulse 80   Temp 98.3 ?F (36.8 ?C) (Oral)   Resp 16   Ht 5\' 6"  (1.676 m)   Wt 56.2 kg   LMP  (LMP Unknown)   SpO2 95%   BMI 20.01 kg/m?  ?Physical Exam ?Vitals and nursing note reviewed.  ?Constitutional:   ?   Appearance: She is well-developed.  ?HENT:  ?   Head: Normocephalic and atraumatic.  ?Eyes:  ?   Pupils: Pupils are equal, round, and reactive to light.  ?Cardiovascular:  ?   Pulses: Normal pulses. No decreased pulses.  ?Musculoskeletal:     ?   General: Tenderness present.  ?   Cervical back: Normal range of motion and neck supple.  ?   Left knee: No  bony tenderness. Normal range of motion.  ?   Left lower leg: No tenderness or bony tenderness.  ?   Right ankle: No swelling. No tenderness. Normal range of motion.  ?   Left ankle: Swelling present. Tenderness present. Normal range of motion.  ?   Right foot: Normal range of motion. No swelling, tenderness or bony tenderness.  ?   Left foot: Normal range of motion. Swelling and tenderness present. No bony tenderness.  ?   Comments: I am able to range the ankle and foot passively without much difficulty.  ?Skin: ?   General: Skin is warm and dry.  ?   Comments: There is a small abrasion over the forefoot, no laceration.  I do not see any other trauma, track marks, etc on the foot, toes, between the toes, ankle.  There is generalized swelling of the forefoot with associated warmth.  ?Neurological:  ?   Mental Status: She is alert.  ?   Sensory: No sensory deficit.  ?   Comments: Motor, sensation, and vascular distal to the injury is fully intact.   ?Psychiatric:     ?   Mood and Affect: Mood normal.  ? ? ?ED Results / Procedures / Treatments   ?Labs ?(all  labs ordered are listed, but only abnormal results are displayed) ?Labs Reviewed - No data to display ? ?EKG ?None ? ?Radiology ?DG Ankle Complete Left ? ?Result Date: 05/12/2021 ?CLINICAL DATA:  Left ankle pain. EXAM: LEFT ANKLE COMPLETE - 3+ VIEW COMPARISON:  None. FINDINGS: Evaluation is somewhat limited due to positioning. There is anterior positioning of the talar dome in relation to the tibial plafond and on the lateral view which may be positional or represent anterior subluxation of the ankle mortise. Clinical correlation is recommended. No definite acute fracture. The soft tissues are unremarkable. IMPRESSION: No definite acute fracture. Possible anterior subluxation of the ankle. Clinical correlation is recommended. Electronically Signed   By: Anner Crete M.D.   On: 05/12/2021 22:21   ? ?Procedures ?Procedures  ? ? ?Medications Ordered in  ED ?Medications - No data to display ? ?ED Course/ Medical Decision Making/ A&P ?  ? ?Patient seen and examined. History obtained directly from patient.  Vital signs reviewed, no fever, no tachycardia. ? ?Labs/EKG: None ordered ? ?Imaging: X-ray ordered in triage, reviewed, agree no fracture.  There was a question of subluxation.  I was able to range the patient's foot and ankle in triage and I have low suspicion for an acute subluxation or dislocation at this time. ? ?Medications/Fluids: None ordered. ? ?Most recent vital signs reviewed and are as follows: ?BP 121/88 (BP Location: Left Arm)   Pulse 80   Temp 98.3 ?F (36.8 ?C) (Oral)   Resp 16   Ht 5\' 6"  (1.676 m)   Wt 56.2 kg   LMP  (LMP Unknown)   SpO2 95%   BMI 20.01 kg/m?  ? ?Initial impression: Ankle injury with associated swelling and foot swelling.  Considered soft tissue infection and septic arthritis.  Patient denies injections into the area.  She does not have fever or tachycardia or other signs of systemic infection here.  She states that "I do not think it is infected".  Will not start antibiotics at this time.    ? ?Encouraged RICE protocol, NSAIDs/Tylenol if possible.  She has an Ace wrap at bedside. ? ?Pt urged to return with worsening pain, worsening swelling, expanding area of redness or streaking up extremity, fever, or any other concerns. Pt verbalizes understanding and agrees with plan. ? ?                        ?Medical Decision Making ?Amount and/or Complexity of Data Reviewed ?Radiology: ordered. ? ? ?Patient with left ankle injury after falling down some stairs.  She has associated pain and swelling.  Considered infection, but low concern currently.  Return instructions as above.  Vital signs are reassuring.  Do not suspect acute subluxation or dislocation as I am able to actively range her ankle and foot without much difficulty. ? ? ? ? ? ? ? ? ? ?Final Clinical Impression(s) / ED Diagnoses ?Final diagnoses:  ?Acute left ankle pain   ?Foot swelling  ? ? ?Rx / DC Orders ?ED Discharge Orders   ? ? None  ? ?  ? ? ?  ?Carlisle Cater, PA-C ?05/12/21 2330 ? ?  ?Ripley Fraise, MD ?05/13/21 [phone] ? ?

## 2021-05-12 NOTE — Discharge Instructions (Signed)
Please read and follow all provided instructions. ? ?Your diagnoses today include:  ?1. Acute left ankle pain   ?2. Foot swelling   ? ? ?Tests performed today include: ?An x-ray of the affected area - does NOT show any broken bones ?Vital signs. See below for your results today.  ? ?Medications prescribed:  ?Please use over-the-counter NSAID medications (ibuprofen, naproxen) as directed on the packaging for pain if you do not have any reasons not to take these medications just as weak kidneys or a history of bleeding in your stomach or gut.  ? ?Take any prescribed medications only as directed. ? ?Home care instructions:  ?Follow any educational materials contained in this packet ?Follow [facility] Protocol: ?R - rest your injury ? I  - use ice on injury without applying directly to skin ?C - compress injury with bandage or splint ?E - elevate the injury as much as possible ? ?Follow-up instructions: ?Please follow-up with your primary care provider or the provided orthopedic physician (bone specialist) if you continue to have significant pain in 1 week. In this case you may have a more severe injury that requires further care.  ? ?Return instructions:  ?Please return if your toes or feet are numb or tingling, appear gray or blue, or you have severe pain (also elevate the leg and loosen splint or wrap if you were given one) ?Return with worsening pain, worsening swelling, expanding area of redness or streaking up extremity, fever, or any other concerns.   ?Please return to the Emergency Department if you experience worsening symptoms.  ?Please return if you have any other emergent concerns. ? ?Additional Information: ? ?Your vital signs today were: ?BP 121/88 (BP Location: Left Arm)   Pulse 80   Temp 98.3 ?F (36.8 ?C) (Oral)   Resp 16   Ht 5\' 6"  (1.676 m)   Wt 56.2 kg   LMP  (LMP Unknown)   SpO2 95%   BMI 20.01 kg/m?  ?If your blood pressure (BP) was elevated above 135/85 this visit, please have this repeated  by your doctor within one month. ?-------------- ? ?

## 2021-05-12 NOTE — ED Triage Notes (Signed)
Pt in police custody. She states that she fell 2 days ago. States that she can barely put any weight on her L ankle. Needs clearance for jail.  ?
